# Patient Record
Sex: Female | Born: 1970
Health system: Southern US, Community
[De-identification: ages and names within clinical notes are randomized; demographics above are authoritative.]

## PROBLEM LIST (undated history)

## (undated) DIAGNOSIS — M1990 Unspecified osteoarthritis, unspecified site: Secondary | ICD-10-CM

## (undated) DIAGNOSIS — I10 Essential (primary) hypertension: Secondary | ICD-10-CM

## (undated) DIAGNOSIS — B019 Varicella without complication: Secondary | ICD-10-CM

## (undated) DIAGNOSIS — O039 Complete or unspecified spontaneous abortion without complication: Secondary | ICD-10-CM

## (undated) DIAGNOSIS — T7840XA Allergy, unspecified, initial encounter: Secondary | ICD-10-CM

## (undated) DIAGNOSIS — M549 Dorsalgia, unspecified: Secondary | ICD-10-CM

## (undated) DIAGNOSIS — E559 Vitamin D deficiency, unspecified: Secondary | ICD-10-CM

## (undated) DIAGNOSIS — M2550 Pain in unspecified joint: Secondary | ICD-10-CM

## (undated) DIAGNOSIS — R002 Palpitations: Secondary | ICD-10-CM

## (undated) DIAGNOSIS — K219 Gastro-esophageal reflux disease without esophagitis: Secondary | ICD-10-CM

## (undated) DIAGNOSIS — R12 Heartburn: Secondary | ICD-10-CM

## (undated) HISTORY — DX: Unspecified osteoarthritis, unspecified site: M19.90

## (undated) HISTORY — DX: Varicella without complication: B01.9

## (undated) HISTORY — DX: Heartburn: R12

## (undated) HISTORY — DX: Palpitations: R00.2

## (undated) HISTORY — PX: OTHER SURGICAL HISTORY: SHX169

## (undated) HISTORY — DX: Vitamin D deficiency, unspecified: E55.9

## (undated) HISTORY — PX: TUBAL LIGATION: SHX77

## (undated) HISTORY — DX: Pain in unspecified joint: M25.50

## (undated) HISTORY — DX: Allergy, unspecified, initial encounter: T78.40XA

## (undated) HISTORY — DX: Complete or unspecified spontaneous abortion without complication: O03.9

## (undated) HISTORY — DX: Essential (primary) hypertension: I10

## (undated) HISTORY — DX: Gastro-esophageal reflux disease without esophagitis: K21.9

## (undated) HISTORY — DX: Dorsalgia, unspecified: M54.9

---

## 1998-04-22 ENCOUNTER — Inpatient Hospital Stay (HOSPITAL_COMMUNITY): Admission: AD | Admit: 1998-04-22 | Discharge: 1998-04-22 | Payer: Self-pay | Admitting: Obstetrics and Gynecology

## 1998-05-19 ENCOUNTER — Inpatient Hospital Stay (HOSPITAL_COMMUNITY): Admission: AD | Admit: 1998-05-19 | Discharge: 1998-05-19 | Payer: Self-pay | Admitting: *Deleted

## 1998-05-21 ENCOUNTER — Inpatient Hospital Stay (HOSPITAL_COMMUNITY): Admission: AD | Admit: 1998-05-21 | Discharge: 1998-05-21 | Payer: Self-pay | Admitting: *Deleted

## 1998-05-25 ENCOUNTER — Inpatient Hospital Stay (HOSPITAL_COMMUNITY): Admission: AD | Admit: 1998-05-25 | Discharge: 1998-05-27 | Payer: Self-pay | Admitting: *Deleted

## 1998-06-15 ENCOUNTER — Inpatient Hospital Stay (HOSPITAL_COMMUNITY): Admission: AD | Admit: 1998-06-15 | Discharge: 1998-06-19 | Payer: Self-pay | Admitting: Obstetrics and Gynecology

## 1998-07-29 ENCOUNTER — Other Ambulatory Visit: Admission: RE | Admit: 1998-07-29 | Discharge: 1998-07-29 | Payer: Self-pay | Admitting: *Deleted

## 1999-07-26 ENCOUNTER — Other Ambulatory Visit: Admission: RE | Admit: 1999-07-26 | Discharge: 1999-07-26 | Payer: Self-pay | Admitting: *Deleted

## 2000-09-20 ENCOUNTER — Other Ambulatory Visit: Admission: RE | Admit: 2000-09-20 | Discharge: 2000-09-20 | Payer: Self-pay | Admitting: *Deleted

## 2001-06-24 ENCOUNTER — Emergency Department (HOSPITAL_COMMUNITY): Admission: EM | Admit: 2001-06-24 | Discharge: 2001-06-24 | Payer: Self-pay | Admitting: Emergency Medicine

## 2001-06-24 ENCOUNTER — Encounter: Payer: Self-pay | Admitting: Emergency Medicine

## 2001-06-26 ENCOUNTER — Other Ambulatory Visit: Admission: RE | Admit: 2001-06-26 | Discharge: 2001-06-26 | Payer: Self-pay | Admitting: *Deleted

## 2002-09-12 ENCOUNTER — Other Ambulatory Visit: Admission: RE | Admit: 2002-09-12 | Discharge: 2002-09-12 | Payer: Self-pay | Admitting: *Deleted

## 2003-10-31 ENCOUNTER — Other Ambulatory Visit: Admission: RE | Admit: 2003-10-31 | Discharge: 2003-10-31 | Payer: Self-pay | Admitting: Obstetrics and Gynecology

## 2005-01-18 ENCOUNTER — Other Ambulatory Visit: Admission: RE | Admit: 2005-01-18 | Discharge: 2005-01-18 | Payer: Self-pay | Admitting: Obstetrics and Gynecology

## 2006-01-27 ENCOUNTER — Other Ambulatory Visit: Admission: RE | Admit: 2006-01-27 | Discharge: 2006-01-27 | Payer: Self-pay | Admitting: Obstetrics and Gynecology

## 2006-03-29 ENCOUNTER — Encounter: Payer: Self-pay | Admitting: Emergency Medicine

## 2007-01-31 ENCOUNTER — Ambulatory Visit: Payer: Self-pay | Admitting: Cardiovascular Disease

## 2007-02-02 ENCOUNTER — Encounter (INDEPENDENT_AMBULATORY_CARE_PROVIDER_SITE_OTHER): Payer: Self-pay | Admitting: Specialist

## 2007-02-02 ENCOUNTER — Ambulatory Visit (HOSPITAL_COMMUNITY): Admission: RE | Admit: 2007-02-02 | Discharge: 2007-02-02 | Payer: Self-pay | Admitting: Obstetrics and Gynecology

## 2007-02-07 ENCOUNTER — Ambulatory Visit (HOSPITAL_COMMUNITY): Admission: AD | Admit: 2007-02-07 | Discharge: 2007-02-07 | Payer: Self-pay | Admitting: Obstetrics and Gynecology

## 2007-02-09 ENCOUNTER — Ambulatory Visit: Payer: Self-pay | Admitting: Cardiovascular Disease

## 2007-03-29 ENCOUNTER — Ambulatory Visit: Payer: Self-pay | Admitting: Cardiovascular Disease

## 2008-09-12 ENCOUNTER — Inpatient Hospital Stay (HOSPITAL_COMMUNITY): Admission: AD | Admit: 2008-09-12 | Discharge: 2008-09-12 | Payer: Self-pay | Admitting: Obstetrics and Gynecology

## 2008-10-03 ENCOUNTER — Inpatient Hospital Stay (HOSPITAL_COMMUNITY): Admission: AD | Admit: 2008-10-03 | Discharge: 2008-10-03 | Payer: Self-pay | Admitting: Obstetrics and Gynecology

## 2008-10-27 ENCOUNTER — Inpatient Hospital Stay (HOSPITAL_COMMUNITY): Admission: AD | Admit: 2008-10-27 | Discharge: 2008-10-27 | Payer: Self-pay | Admitting: Obstetrics and Gynecology

## 2008-11-03 ENCOUNTER — Encounter (INDEPENDENT_AMBULATORY_CARE_PROVIDER_SITE_OTHER): Payer: Self-pay | Admitting: Obstetrics and Gynecology

## 2008-11-03 ENCOUNTER — Inpatient Hospital Stay (HOSPITAL_COMMUNITY): Admission: RE | Admit: 2008-11-03 | Discharge: 2008-11-06 | Payer: Self-pay | Admitting: Obstetrics and Gynecology

## 2011-01-13 ENCOUNTER — Other Ambulatory Visit: Payer: Self-pay | Admitting: Obstetrics and Gynecology

## 2011-01-13 DIAGNOSIS — R928 Other abnormal and inconclusive findings on diagnostic imaging of breast: Secondary | ICD-10-CM

## 2011-01-27 ENCOUNTER — Other Ambulatory Visit: Payer: Self-pay | Admitting: Obstetrics and Gynecology

## 2011-01-27 ENCOUNTER — Ambulatory Visit
Admission: RE | Admit: 2011-01-27 | Discharge: 2011-01-27 | Disposition: A | Discharge status: Home / Self Care | Payer: Self-pay | Source: Ambulatory Visit | Attending: Obstetrics and Gynecology | Admitting: Obstetrics and Gynecology

## 2011-01-27 DIAGNOSIS — R928 Other abnormal and inconclusive findings on diagnostic imaging of breast: Secondary | ICD-10-CM

## 2011-01-27 DIAGNOSIS — Z09 Encounter for follow-up examination after completed treatment for conditions other than malignant neoplasm: Secondary | ICD-10-CM

## 2011-04-12 NOTE — H&P (Signed)
NAME:  Gina, Farrell NO.:  [id number]   MEDICAL RECORD NO.:  [id number]         PATIENT TYPE:  WINP   LOCATION:                                FACILITY:  [facility]   PHYSICIAN:  Juluis Mire, M.D.   DATE OF BIRTH:  1971/08/13   DATE OF ADMISSION:  11/03/2008  DATE OF DISCHARGE:                              HISTORY & PHYSICAL   The patient is a 40 year old, gravida 5, para 1-1-2-2, female, last  menstrual period of February 14, 2008, with an estimated date of  confinement of November 20, 2008, estimated gestational age of 36-1/2  weeks.   The patient had a prior cesarean sections and is now admitted for repeat  cesarean section and bilateral tubal ligation.  Her prenatal course has  been complicated by chronic hypertension.  She has been on labetalol and  hydrochlorothiazide.  Blood pressure has been rising over the last few  weeks.  On Friday, we did an amniocentesis for lung maturity, LS was  2.81 with PG.  The patient is desirous to proceed with repeat cesarean  section at the present time.  Trial of labor was discussed.  She also  desires for permanent sterilization.   The patient was also at risk for advanced maternal age.  She did undergo  first trimester screening.  This was basically unremarkable.  She did  not undergo amniocentesis, although it was offered.   In terms of allergies, no known drug allergies.   Medications include:  1. Labetalol 100 mg twice a day.  2. Hydrochlorothiazide 25 mg daily.  3. Prenatal vitamins.   For past medical history, family history, and social history, please see  prenatal records.   REVIEW OF SYSTEMS:  Noncontributory.   PHYSICAL EXAMINATION:  GENERAL:  The patient is afebrile.  VITAL SIGNS:  Stable.  HEENT:  The patient is normocephalic.  Pupils equal, round, and reactive  to light and accommodation.  Extraocular movements were intact.  Sclerae  and conjunctivae clear.  Oropharynx clear.  NECK:  Without  thyromegaly.  BREASTS:  Not examined.  LUNGS:  Clear.  CARDIAC:  Regular rate without murmurs or gallops.  ABDOMEN:  Gravid uterus consistent with dates.  PELVIC:  Cervix is long and closed.  EXTREMITIES:  Trace edema.  NEUROLOGIC:  Grossly within normal limits.   IMPRESSION:  1. Intrauterine pregnancy at 37-1/2 weeks.  2. Chronic hypertension.  3. Prior cesarean section for repeat.  4. Desires sterility.   PLAN:  The patient will undergo repeat cesarean section with bilateral  tubal ligation.  The potential irreversibility of sterilization was  discussed.  Failure rate of 1 in 200 was quoted.  It can be in the form  of ectopic pregnancy requiring further surgical management.  Alternatives were discussed.  The risk of C-section was explained  including the risk of infection.  The risk of hemorrhage that could  require transfusion versus the risk of AIDS or hepatitis.  Risk of  injury to adjacent organs including bladder, bowel, ureters that could  require further exploratory surgery.  Risk of deep venous thrombosis  and  pulmonary embolus.  The patient expressed understand of indications and  risks.      Juluis Mire, M.D.  Electronically Signed     JSM/MEDQ  D:  11/03/2008  T:  11/04/2008  Job:  [id number]

## 2011-04-12 NOTE — Op Note (Signed)
Gina Farrell, Gina Farrell NO.:  [id number]   MEDICAL RECORD NO.:  [id number]          PATIENT TYPE:  INP   LOCATION:  9126                          FACILITY:  [facility]   PHYSICIAN:  Juluis Mire, M.D.   DATE OF BIRTH:  [birth year]   DATE OF PROCEDURE:  DATE OF DISCHARGE:                               OPERATIVE REPORT   PREOPERATIVE DIAGNOSES:  Pregnancy at 37+ weeks.  Chronic hypertension.  Repeat cesarean section.  Desires sterility.   POSTOPERATIVE DIAGNOSES:  Pregnancy at 37+ weeks.  Chronic hypertension.  Repeat cesarean section.  Desires sterility.   PROCEDURE:  Low transverse cesarean section, bilateral tubal ligation.   SURGEON:  Juluis Mire, MD   ANESTHESIA:  Spinal.   ESTIMATED BLOOD LOSS:  500-600 mL.   PACKS AND DRAINS:  None.   INTRAOPERATIVE BLOOD PLACED:  None.   COMPLICATIONS:  None.   INDICATIONS:  As dictated in history and physical procedure.   PROCEDURE:  The patient was taken to the OR, placed in supine position  with left lateral tilt.  After satisfactory level of spinal anesthesia  was obtained, the abdomen was prepped out with Betadine and draped  sterile field.  Prior low transverse cesarean section scar was excised.  The incision was extended through subcutaneous tissue.  The anterior  rectus fascia identified and sharp incision fashioned laterally.  Fascia  taken off the muscle superiorly and inferiorly.  Rectus muscles were  separated in the midline.  Peritoneum was entered sharp.  An incision  apparently extended both superiorly and inferiorly.  Low transverse  bladder flap was developed.  A low transverse uterine incision begun  with knife, extended laterally to manage traction.  The infant was in  the vertex presentation, delivered with elevation and fundal pressure,  there was a viable female weighing 7 pounds 3 ounces, Apgars were 9/9.  Umbilical artery pH was 7.31.  Placenta was delivered manually.  The  uterus was  exteriorized for closure.  Uterus was then closed with  running locking suture of 0 chromic using a two-layer closure technique.  We had good hemostasis.  Tubes and ovaries were unremarkable.  The  patient desires permanent sterilization.  Mid segment of each tube was  elevated with a Babcock.  A hole was made in the avascular of the  mesosalpinx using the Bovie.  Individual sutures of 0 plain catgut were  used to ligate off the segment of tube.  The intervening segment tube  was excised.  Cut ends of the tubes were cauterized using the Bovie.  Tubes went to pathology.  Uterus then returned to the abdominal cavity.  We irrigated the pelvis, had good hemostasis, and clear urine output.  Peritoneum muscles closed with running suture of 3-0 Vicryl.  Fascia  closed with running suture of 0 PDS.  Skin was closed with staples  and Steri-Strips.  Sponge, instrument, needle count was reported correct  by circulating nurse x2.  Foley catheter remained clear at time of  closure.  The patient tolerated the procedure well and was returned to  recovery room  in good condition.      Juluis Mire, M.D.  Electronically Signed     JSM/MEDQ  D:  11/03/2008  T:  11/04/2008  Job:  [id number]

## 2011-04-15 NOTE — Assessment & Plan Note (Signed)
[facility]                            CARDIOLOGY OFFICE NOTE   HERLINDA, HEADY                        MRN:          [id number]  DATE:03/29/2007                            DOB:          [birth year]    Kerby Less returns for outpatient followup at the [facility] Cardiology  Office on Mar 29, 2007.  She is a very nice 40 year old woman who returns  for further evaluation of her hypertension.  Mrs. Szuch was initially  seen back in early March of this year when she was at 50 weeks'  gestation in her third pregnancy.  Unfortunately, she miscarried during  that pregnancy.  She returns now for followup.  She had previously been  on Benicar hydrochlorothiazide and was well-controlled.  When she  presented to see me, she was on labetalol and I had increased her  labetalol to twice daily dosing in order to improve her blood pressure  control.  She returns today on b.i.d. labetalol and brings in some home  blood pressure readings.  She has had several readings just over 140  systolic and over 90 diastolic.  She has really not had too many  readings at goal of less than 135/85.  She has not had any symptoms.  She has not had any chest pain, dyspnea, orthopnea, or PND.  She has a  few palpitations, but these are much less than when she was pregnant.   CURRENT MEDICATIONS:  1. Multivitamin.  2. Labetalol 100 mg twice daily.   EXAM:  She is alert and oriented in no acute distress.  Weight 242 pounds, blood pressure 130/100, heart rate 73, respiratory  rate 16.  HEENT:  Normal.  NECK:  Normal carotid upstrokes without bruits.  Jugular venous pressure  is normal.  LUNGS:  Clear to auscultation bilaterally.  HEART:  Regular rate and rhythm without murmurs or gallops.  ABDOMEN:  Soft and nontender.  EXTREMITIES:  No cyanosis, clubbing, or edema.  Peripheral pulses are 2+  and equal throughout.   ASSESSMENT:  Ms. Bodnar is a 40 year old woman with essential  hypertension.  She has suboptimal control of her blood pressure on twice  daily labetalol.  I reviewed her situation in detail today and I think  that the addition of hydrochlorothiazide would be a reasonable first  choice.  We could consider increasing her labetalol dose, but she has  had some bradycardia in the past and I do not think she will be tolerant  to a much higher dose of labetalol.  Hydrochlorothiazide would be a  reasonable choice if she does become pregnant again, which she is  currently considering.  However, if there is a significant concern for  oligohydramnios then we could use an alternative medication.  For the  time being, I think we should try her on this combination and she will  let us know if she decides to try for another pregnancy.  At that point,  I will be in contact with Dr. Arelia Sneddon and discuss the issue in detail  with him.   I will now plan on seeing  Ms. Seim back on a p.r.n. basis.  She will  contact our office if she has a suboptimal response to her medication,  or if she develops any problems.  She was also  advised to contact us if she becomes pregnant again.  She keeps a close  record of her home blood pressures and I did not think it was necessary  to schedule her for a return nurse visit.     Veverly Fells. Excell Seltzer, MD  Electronically Signed    MDC/MedQ  DD: 03/29/2007  DT: 03/29/2007  Job #: [id number]   cc:   Juluis Mire, M.D.  Lucky Cowboy, M.D.

## 2011-04-15 NOTE — H&P (Signed)
NAME:  EXA, BOMBA NO.:  [id number]   MEDICAL RECORD NO.:  [id number]          PATIENT TYPE:  AMB   LOCATION:  SDC                           FACILITY:  [facility]   PHYSICIAN:  Juluis Mire, M.D.   DATE OF BIRTH:  Jan 19, 1971   DATE OF ADMISSION:  DATE OF DISCHARGE:                              HISTORY & PHYSICAL   The patient is a 40 year old gravida 4, para 1-1-1-2 female who presents  for D&E.   The patient was seen in our office at 12 weeks.  An ultrasound was  performed which revealed no fetal heart activity consistent with a  nonviable pregnancy.  The patient now presents for dilatation and  evacuation.  Blood type is O positive.   In terms of allergies, no known drug allergies.   MEDICATIONS:  Include prenatal vitamins and labetalol.   PAST MEDICAL HISTORY:  Significant for a history of hypertension under  active management.  In terms of surgical history,  she has had a D&E in  1997.  She had a C-section in 1993.  Laser conization in 1994.  She has  had vaginal deliveries in 1999.   FAMILY HISTORY:  Noncontributory.   SOCIAL HISTORY:  No tobacco or alcohol use.   REVIEW OF SYSTEMS:  Noncontributory.   PHYSICAL EXAMINATION:  Afebrile with stable vital signs.  HEENT:  The patient is normocephalic.  Pupils equal, round and reactive  to light and accommodation.  Extraocular were intact.  Sclerae and  conjunctivae are clear.  Oropharynx clear.  NECK:  Without thyromegaly.  BREASTS:  Not examined.  LUNGS:  Clear.  CARDIOVASCULAR SYSTEM:  Regular rhythm and rate without murmurs or  gallops.  ABDOMINAL EXAM:  Is benign.  PELVIC:  Normal external genitalia.  Vaginal mucosa is clear.  Cervix  unremarkable.  Uterus approximately 10 weeks in size.  Adnexa  unremarkable.  EXTREMITIES:  Trace edema.  NEUROLOGIC EXAM:  Grossly within normal limits.   IMPRESSION:  Nonviable first trimester pregnancy.   PLAN:  The patient will undergo dilatation and  evacuation.  The risks  have been discussed including the risk of infection.  The risk of  hemorrhage could require transfusion with the risk of AIDS or hepatitis.  Excessive bleeding  could lead to hysterectomy.  There is a risk of perforation that could  lead to injury to adjacent organs such as bowel requiring further  exploratory surgery.  Risk of deep venous thrombosis and pulmonary  embolus.  The patient expressed understanding of the indications and  risks.      Juluis Mire, M.D.  Electronically Signed     JSM/MEDQ  D:  02/02/2007  T:  02/02/2007  Job:  [id number]

## 2011-04-15 NOTE — Op Note (Signed)
NAME:  Gina Farrell, MCCLEES NO.:  [id number]   MEDICAL RECORD NO.:  [id number]          PATIENT TYPE:  AMB   LOCATION:  [facility]                          FACILITY:  [facility]   PHYSICIAN:  Juluis Mire, M.D.   DATE OF BIRTH:  12/11/1970   DATE OF PROCEDURE:  02/07/2007  DATE OF DISCHARGE:                               OPERATIVE REPORT   PREOPERATIVE DIAGNOSIS:  Retained products of pregnancy or intrauterine  clot.   POSTOPERATIVE DIAGNOSIS:  Retained products of pregnancy or intrauterine  clot.  O'clock.   OPERATIVE PROCEDURE:  Intrauterine evacuation.   SURGEON:  Juluis Mire, M.D.   ANESTHESIA:  Sedation.   ESTIMATED BLOOD LOSS:  Minimal.   PACKS AND DRAINS:  None.   INTRAOPERATIVE BLOOD PLACED:  none.   COMPLICATIONS:  None.   INDICATIONS:  Dictated in the history and physical.   PROCEDURE:  The patient was taken to the OR, placed in supine position.  After sedation, was placed in dorsal lithotomy position.  Patient was  draped in a sterile field and a speculum was placed in the vaginal  vault.  A moderate amount of clots were removed.  Cervix and vagina  cleansed with Betadine.  Cervix was grasped with a single-tooth  tenaculum.  The cervix was already dilated.  A size 8 curved suction  curette was introduced and the intrauterine cavity was suction-curetted.  We retrieved a moderate amount of old clots, possible products of  conception.  Eventually no more clotted material was noted.  We began  Pitocin and gave her IV Ancef. Subsequently intrauterine curettings  revealed all quadrants to be clear.  Repeat suction curetting revealed  no additional tissue.  The uterus was contracting down well and there  was minimal bleeding.   At this point the patient was taken out of the dorsal lithotomy position  and once alert transferred to the recovery room in good condition.  Sponge, instrument and needle count reported as correct by circulating  nurse x2.     Juluis Mire, M.D.  Electronically Signed    JSM/MEDQ  D:  02/07/2007  T:  02/08/2007  Job:  [id number]

## 2011-04-15 NOTE — H&P (Signed)
NAME:  Gina Farrell, Gina Farrell NO.:  [id number]   MEDICAL RECORD NO.:  [id number]          PATIENT TYPE:  AMB   LOCATION:  [facility]                          [facility]   PHYSICIAN:  Juluis Mire, M.D.   DATE OF BIRTH:  11-08-1971   DATE OF ADMISSION:  02/07/2007  DATE OF DISCHARGE:                              HISTORY & PHYSICAL   A 40 year old gravida 4, para 1-1-2-2 female who presents for repeat  D&E.   The patient did have a nonviable pregnancy picked up last week.  She  underwent a D &E in the hospital on March 12.  She presented back today  complaining of pain and discomfort and heavy bleeding.  On an ultrasound  evaluation, has a large amount of clot and possible products of  conception within the uterine cavity.  We are going to repeat the D&C.  Her blood type is O positive.   In terms of allergies, no known drug allergies.   MEDICATIONS:  Include vitamins and labetalol.   PAST MEDICAL HISTORY:  History of hypertension under active management.   SURGICAL HISTORY:  She had a D&E in 1997.  She has C-section in 1993, a  laser conization in 1994, and she has had vaginal delivery in 1999.   FAMILY HISTORY:  Noncontributory.   SOCIAL HISTORY:  No tobacco or alcohol use.   REVIEW OF SYSTEMS:  Is noncontributory.   PHYSICAL EXAMINATION:  The patient is afebrile with stable vital signs.  HEENT:  The patient is normocephalic.  Pupils equal, round, and reactive  to light and accommodation.  Extraocular movements were intact.  Sclerae  and conjunctivae were clear.  Oropharynx clear.  NECK:  Without thyromegaly.  BREASTS:  No discrete masses.  LUNGS:  Clear.  CARDIOVASCULAR SYSTEM:  Regular rhythm and rate without murmurs or  gallops.  Her abdominal exam is benign.  She does have some tenderness in the  suprapubic area and a fullness on pelvic exam, moderate bleeding.  Uterus is approximately 16 weeks in size.  Adnexa unremarkable.  EXTREMITIES:  Trace edema.  NEUROLOGICAL EXAM:  Grossly within normal limits.   Ultrasound did reveal a large amount of clotted material within the  uterine cavity.   IMPRESSION:  Clots within the uterine cavity versus retained products.   PLAN:  The patient will undergo a repeat dilatation and evacuation.  Risk were discussed including the risk of infection.  Risk of hemorrhage  could require transfusion with the risk of AIDS or hepatitis.  Could be  the rare risk of  hysterectomy, risk of injury to adjacent organs through perforation  including bowel/bladder that could require further exploratory surgery.  Risk of deep venous thrombosis and pulmonary embolus.  The patient  expressed understanding of indications and risks.      Juluis Mire, M.D.  Electronically Signed     JSM/MEDQ  D:  02/07/2007  T:  02/08/2007  Job:  [id number]

## 2011-04-15 NOTE — Discharge Summary (Signed)
[name], Farrell NO.:  [id number]   MEDICAL RECORD NO.:  [id number]          PATIENT TYPE:  INP   LOCATION:  9126                          FACILITY:  [facility]   PHYSICIAN:  Dineen Kid. Rana Snare, M.D.    DATE OF BIRTH:  10/09/1971   DATE OF ADMISSION:  11/03/2008  DATE OF DISCHARGE:  11/06/2008                               DISCHARGE SUMMARY   ADMITTING DIAGNOSES:  1. Intrauterine pregnancy at 37+ weeks estimated gestational age.  2. Chronic hypertension.  3. Previous cesarean section, desires repeat.  4. Multiparity, desires permanent sterilization.   DISCHARGE DIAGNOSES:  1. Status post low transverse cesarean section.  2. Viable female infant.  3. Permanent sterilization.   REASON FOR ADMISSION:  Please see dictated H&P.   HOSPITAL COURSE:  The patient is a 40 year old gravida 5, para 1-1-2-2  that was admitted to [facility] for scheduled cesarean  section.  The patient's pregnancy had been complicated by chronic  hypertension.  The patient had had previous cesarean delivery and  desired repeat.  Due to multiparity, the patient also requested  permanent sterilization.  On the morning of admission, the patient was  taken to the operating room where spinal anesthesia was administered  without difficulty.  A low transverse incision was made with delivery of  a viable female infant weighing 7 pounds 3 ounces with Apgars of 9 at 1  minute and 9 at 5 minutes.  Arterial cord pH of 7.31.  The patient  underwent bilateral tubal ligation without difficulty.  She tolerated  the procedure well and was taken to the recovery room in stable  condition.   On postoperative day #1, the patient was without complaint.  Vital signs  remained stable.  She was afebrile.  Blood pressure was 110/71 to  130/84.  Abdomen soft with good return of bowel function.  Fundus was  firm and nontender.  Foley had been discontinued.  The patient was  voiding well.  Abdominal  dressing noted to have a small amount of  drainage on the bandage on the left side.  Laboratory findings showed  hemoglobin of 10.7, platelet count of 189,000, WBC count of 11.4.  Blood  type was noted to be O+.  On postoperative day #2, the patient was  without complaint.  She had slept better.  Baby was feeding well.  Vital  signs were stable.  She was afebrile.  Fundus was firm and nontender.  Incision was clean, dry, and intact with slight moisture noted under the  pannus at the incisional site.  No bleeding or erythema was noted.  On  postoperative day #3, the patient was without complaint.  She denied  headache or blurred vision or right upper quadrant pain.  Vital signs  were stable.  She is afebrile.  Blood pressure was 148/99 to 139/90.  Deep tendon reflexes were 1-2+.  No clonus.  Small amount of pedal edema  was noted.  Abdomen soft.  Fundus firm and nontender.  Incision was  clean and intact.  Slight moisture was noted and staples were left in  place.  Discharge instructions reviewed and the patient was later  discharged home.   CONDITION ON DISCHARGE:  Stable.   DIET:  Regular as tolerated.   ACTIVITY:  No heavy lifting.  No driving x2 weeks.  No vaginal entry.   FOLLOWUP:  The patient is to follow up in the office in 4 days for blood  pressure check and an incision check.  She is to call for temperature  greater than 100 degrees, persistent nausea, vomiting, heavy vaginal  bleeding and/or redness or drainage from the incisional site.   DISCHARGE MEDICATIONS:  1. Tylox #30 one p.o. 4-6 hours p.r.n.  2. Motrin 600 mg every 6 hours.  3. Labetalol 200 mg 1 p.o. b.i.d.  4. Hydrochlorothiazide 25 mg 1 p.o. daily.  5. Prenatal vitamins 1 p.o. daily.  6. Colace 1 p.o. daily p.r.n.      Gina Farrell, N.P.      Dineen Kid Rana Snare, M.D.  Electronically Signed    CC/MEDQ  D:  12/16/2008  T:  12/16/2008  Job:  [id number]

## 2011-04-15 NOTE — Letter (Signed)
January 31, 2007    Juluis Mire, M.D.  [address] [place], [place] [address]   RE:  Gina Farrell, Gina Farrell  MRN:  [id number]  /  DOB:  1971/06/08   Dear Dr. Arelia Sneddon:   It was my pleasure to see Crystel Demarco in the [facility]  as an outpatient on January 31, 2007.  As you know, she is a 40 year old  woman who is at [redacted] weeks gestation into her third pregnancy, who has  essential hypertension and frequent ventricular ectopy and palpitations.   Ms. Gutkowski was first diagnosed with pregnancy-induced hypertension when  she had her second child several years ago.  She has had persistent  essential hypertension in the interim and has been treated successfully  with Benicar hydrochlorothiazide.  When she started trying to have her  third child back in November of 2007, she was switched from Benicar  hydrochlorothiazide to labetalol.  She has more recently noted  increasing palpitations and has been noted to have frequent PVCs during  her office visits.  It is unclear whether this increase in symptoms is  associated with the change to labetalol, or whether it is a function of  her pregnancy.  In addition, she has not had as good a blood pressure  control since she has made this change.  She reports home blood pressure  readings between 125-140 systolic over 85-90 diastolic.  When she has  been seen in the physician's office, her blood pressures have been  higher than that, typically in the 140s over 90s.   From a symptomatic standpoint she is doing very well.  She has some  expected fatigue, but otherwise feels fine.  She has no chest pain,  dyspnea, orthopnea, PND, light-headedness, syncope.  She has mild edema  of her legs.   CURRENT MEDICATIONS:  1. Labetalol 100 mg daily.  2. Multivitamin daily.   ALLERGIES:  NKDA.   PAST MEDICAL HISTORY:  1. Essential hypertension.  2. History of C-section with her first child.  She reports no other      medical problems or  hospitalizations.   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  She is married with 2 children and is currently pregnant with her  third child.  She does not smoke cigarettes or drink alcohol.   FAMILY HISTORY:  The patient's maternal grandmother had coronary artery  disease first diagnosed in her 58s.  There is no other coronary artery  disease in her family.  Her parents are healthy.   REVIEW OF SYSTEMS:  A complete 12-point review of systems was performed.  There were no pertinent positives to report other than as mentioned  above.   PHYSICAL EXAMINATION:  The patient is alert and oriented.  She is in no  acute distress.  Weight is 244 pounds.  Blood pressure is 145/98.  On my recheck, her  blood pressure was 150/94.  Heart rate is 74, respiratory rate is 12.  HEENT:  Normal.  NECK:  Normal carotid upstrokes without bruits.  Jugular venous pressure  is normal.  There is no thyromegaly or thyroid nodules.  LUNGS:  Clear to auscultation bilaterally.  CARDIOVASCULAR:  The apex is discrete and nondisplaced.  There is no  right ventricular heave or lift.  The heart is regular rate and rhythm  with a soft grade 1/6 systolic ejection murmur at the left upper sternal  border.  There are no gallops  or diastolic murmurs.  ABDOMEN:  Soft.  Obese.  Nontender.  No organomegaly.  No abdominal  bruits.  EXTREMITIES:  There is trace edema in the pretibial region bilaterally.  There is no clubbing or cyanosis.  Peripheral pulses are 2+ and equal  throughout.  SKIN:  Warm and dry without rash.  LYMPHATICS:  There is no adenopathy.  NEUROLOGIC:  Cranial nerves 2-12 are intact.  Strength is 5/5 and equal  in the arms and legs bilaterally.   ELECTROCARDIOGRAM:  Demonstrates normal sinus rhythm with a ventricular  rate of 78.  There are no abnormalities seen.   ASSESSMENT:  Ms. Sobczak is a 40 year old woman who is at 11-weeks  gestation with her third pregnancy.   Her cardiac issues are as follows.  1. Essential hypertension with suboptimal control.  It is difficult to      know whether labetalol is causing her more trouble with symptomatic      palpitations.  Typically, this would not be the case with beta      blockers, but there seems to be a temporal correlation.  Despite      that, I think she needs better blood pressure control overall and I      have asked her to increase her labetalol to 100 mg twice daily.  If      she has worsening symptoms with that, then we can change her      antihypertensive regimen all together.  I asked her to record her      blood pressures at home over the next 4 weeks and I would like to      see her back after that time to review her blood pressure control      and consider whether we need to make any changes.  2. Palpitations.  I would like to check a 24h Holter monitor to make      sure that she is not having any sustained arrhythmias or other      significant problems.  I suspect her palpitations are from benign      single premature ventricular contractions.  I will wait 1 week to      check her Holter monitor so that she will have that time on b.i.d.      labetalol.  3. Followup.  For followup I will plan on seeing her back in 1 month      after her Holter monitor is complete and we will review her blood      pressure control at that time.   Dr. Arelia Sneddon, thanks for allowing me to participate in the care of Ms.  Scorsone.  Please feel free to call at any time with questions regarding her  care.    Sincerely,      Veverly Fells. Excell Seltzer, MD  Electronically Signed    MDC/MedQ  DD: 01/31/2007  DT: 01/31/2007  Job #: [id number]

## 2011-04-15 NOTE — Op Note (Signed)
[name], Gina Farrell NO.:  [id number]   MEDICAL RECORD NO.:  [id number]          PATIENT TYPE:  AMB   LOCATION:  SDC                           FACILITY:  [facility]   PHYSICIAN:  Juluis Mire, M.D.   DATE OF BIRTH:  08-Oct-1971   DATE OF PROCEDURE:  02/02/2007  DATE OF DISCHARGE:                               OPERATIVE REPORT   PREOPERATIVE DIAGNOSIS:  Nonviable first trimester pregnancy.   POSTOPERATIVE DIAGNOSIS:  Nonviable first trimester pregnancy.   OPERATIVE PROCEDURE:  Paracervical block with dilatation and evacuation.   SURGEON:  Dr. Arelia Sneddon.   ANESTHESIA:  Paracervical block and sedation.   ESTIMATED BLOOD LOSS:  Minimal.   PACKS AND DRAINS:  None.   INTRAOPERATIVE BLOOD REPLACED:  None.   COMPLICATIONS:  None.   INDICATIONS:  As dictated in the history and physical.   PROCEDURE AS FOLLOWS:  The patient was taken to the OR and placed in  supine position. After sedation, the patient was placed in the dorsal  lithotomy position using the Allen stirrups. The patient had been draped  in a sterile field. A speculum was placed in the vaginal vault. The  cervix and vagina were cleansed thoroughly with Betadine. A paracervical  block was instituted using 1% Nesacaine. Cervix was grasped with a  single-toothed tenaculum. Uterus sounded to 12 cm. Cervix was serially  dilated to a size 31 Pratt dilator. A size 10 curved suction curet was  introduced, and the intrauterine was emptied using suction curetting. We  continued this until no additional tissue obtained. We then sharply  curetted and felt that all quadrants were clear. Repeat suction  curetting revealed no additional tissue, and the uterus contracted down  well. There was no active bleeding or signs or perforation. At this  point in time, the speculum and single-toothed tenaculum were removed.  The patient was taken out of the dorsal lithotomy position and once  alert transferred to the recovery room  in good condition. Sponge,  needle, and instrument counts reported as correct by the circulating  nurse.      Juluis Mire, M.D.  Electronically Signed    JSM/MEDQ  D:  02/02/2007  T:  02/02/2007  Job:  [id number]

## 2011-08-02 ENCOUNTER — Other Ambulatory Visit: Payer: Self-pay

## 2011-08-16 ENCOUNTER — Other Ambulatory Visit: Payer: Self-pay

## 2011-08-16 ENCOUNTER — Inpatient Hospital Stay: Admission: RE | Admit: 2011-08-16 | Payer: Self-pay | Source: Ambulatory Visit

## 2011-08-29 LAB — COMPREHENSIVE METABOLIC PANEL
AST: 18
CO2: 24
Calcium: 9.3
Chloride: 104
Creatinine, Ser: 0.49
GFR calc non Af Amer: >60
Glucose, Bld: 87
Total Bilirubin: 0.1 — ABNORMAL LOW

## 2011-08-29 LAB — CBC
HCT: 36.1
Hemoglobin: 12.1
MCHC: 33.6
MCV: 88.1
RBC: 4.1
WBC: 10.5

## 2011-08-29 LAB — LACTATE DEHYDROGENASE: LDH: 100

## 2011-08-30 ENCOUNTER — Ambulatory Visit
Admission: RE | Admit: 2011-08-30 | Discharge: 2011-08-30 | Disposition: A | Discharge status: Home / Self Care | Payer: 59 | Source: Ambulatory Visit | Attending: Obstetrics and Gynecology | Admitting: Obstetrics and Gynecology

## 2011-08-30 DIAGNOSIS — Z09 Encounter for follow-up examination after completed treatment for conditions other than malignant neoplasm: Secondary | ICD-10-CM

## 2011-08-30 LAB — CBC
HCT: 32.5 — ABNORMAL LOW
MCHC: 34.7
MCV: 86
RBC: 3.78 — ABNORMAL LOW
WBC: 8.9

## 2011-08-30 LAB — LACTATE DEHYDROGENASE: LDH: 116

## 2011-08-30 LAB — COMPREHENSIVE METABOLIC PANEL
BUN: 5 — ABNORMAL LOW
CO2: 22
Calcium: 9.4
Chloride: 104
Creatinine, Ser: 0.42
GFR calc non Af Amer: >60
Total Bilirubin: 0.2 — ABNORMAL LOW

## 2011-08-30 LAB — URINE MICROSCOPIC-ADD ON

## 2011-08-30 LAB — URINALYSIS, ROUTINE W REFLEX MICROSCOPIC
Glucose, UA: NEGATIVE
Hgb urine dipstick: NEGATIVE
Ketones, ur: 15 — AB
Protein, ur: NEGATIVE
Urobilinogen, UA: 0.2

## 2011-08-30 LAB — URIC ACID: Uric Acid, Serum: 4.4

## 2011-09-02 LAB — CBC
HCT: 30.5 % — ABNORMAL LOW (ref 36.0–46.0)
MCV: 86 fL (ref 78.0–100.0)
Platelets: 189 10*3/uL (ref 150–400)
Platelets: 221 10*3/uL (ref 150–400)
RBC: 3.54 MIL/uL — ABNORMAL LOW (ref 3.87–5.11)
RBC: 3.89 MIL/uL (ref 3.87–5.11)
WBC: 11.4 10*3/uL — ABNORMAL HIGH (ref 4.0–10.5)
WBC: 8.9 10*3/uL (ref 4.0–10.5)

## 2011-09-02 LAB — BASIC METABOLIC PANEL
BUN: 6 mg/dL (ref 6–23)
GFR calc Af Amer: >60 mL/min (ref >60)
GFR calc non Af Amer: >60 mL/min (ref >60)
Potassium: 3.6 mEq/L (ref 3.5–5.1)
Sodium: 136 mEq/L (ref 135–145)

## 2011-09-02 LAB — RPR: RPR Ser Ql: NONREACTIVE

## 2012-01-14 ENCOUNTER — Ambulatory Visit (INDEPENDENT_AMBULATORY_CARE_PROVIDER_SITE_OTHER): Payer: 59 | Admitting: Physician Assistant

## 2012-01-14 ENCOUNTER — Other Ambulatory Visit: Payer: Self-pay | Admitting: Physician Assistant

## 2012-01-14 VITALS — BP 120/80 | HR 88 | Temp 97.9°F | Resp 18 | Ht 64.0 in | Wt 245.0 lb

## 2012-01-14 DIAGNOSIS — R011 Cardiac murmur, unspecified: Secondary | ICD-10-CM

## 2012-01-14 DIAGNOSIS — L0100 Impetigo, unspecified: Secondary | ICD-10-CM

## 2012-01-14 DIAGNOSIS — J019 Acute sinusitis, unspecified: Secondary | ICD-10-CM

## 2012-01-14 LAB — POCT CBC
Granulocyte percent: 68.6 %G (ref 37–80)
MCH, POC: 62.2 pg — AB (ref 27–31.2)
MCV: 82.1 fL (ref 80–97)
MID (cbc): 0.5 (ref 0–0.9)
MPV: 7.6 fL (ref 0–99.8)
POC LYMPH PERCENT: 25.2 %L (ref 10–50)
POC MID %: 6.2 %M (ref 0–12)
Platelet Count, POC: 336 10*3/uL (ref 142–424)
RDW, POC: 13.7 %

## 2012-01-14 MED ORDER — LEVOFLOXACIN 500 MG PO TABS: 500.0000 mg | ORAL_TABLET | Freq: Every day | ORAL | Status: AC

## 2012-01-14 MED ORDER — IPRATROPIUM BROMIDE 0.06 % NA SOLN: 2.0000 | Freq: Two times a day (BID) | NASAL | Status: DC

## 2012-01-14 MED ORDER — MUPIROCIN 2 % EX OINT: TOPICAL_OINTMENT | Freq: Two times a day (BID) | CUTANEOUS | Status: AC

## 2012-01-14 NOTE — Progress Notes (Signed)
Patient ID: Gina Farrell MRN: [id number], DOB: 24-Nov-1971, 41 y.o. Date of Encounter: 01/14/2012, 12:18 PM  Primary Physician: Dr. Lynford Humphrey   Chief Complaint: Sinus pressure, nasal congestion for 2 weeks, and 3 day history of crusty red nose   HPI: 41 y.o. year old female presents with 2 week history of worsening nasal congestion, sinus pressure, and post nasal drip coupled with 3 day history of mildly erythematous crusty nose. She was seen by her PCP on 2/11 and treated with a Z-pack. Patient states this did not help with her symptoms. Her nasal congestion is thick and green. She states her nose always becomes "red with this crust" when she continually blows it. Has remained afebrile through out. No cough. Hearing is muffled. Sinus pressure worse along maxillary sinuses. Normal appetite. No Gi symptoms.   Never previously told has a murmur. No CP, palpitations, SOB, DOE, dizziness, or lightheadedness.  No leg trauma, sedentary periods, h/o cancer, or tobacco use.  Past Medical History  Diagnosis Date  . Hypertension   . Allergic rhinitis      Home Meds: Prior to Admission medications   Medication Sig Start Date End Date Taking? Authorizing Provider  azithromycin (ZITHROMAX) 500 MG tablet Take 500 mg by mouth daily.   No Historical Provider, MD  montelukast (SINGULAIR) 10 MG tablet Take 10 mg by mouth at bedtime.   Yes Historical Provider, MD  olmesartan-hydrochlorothiazide (BENICAR HCT) 40-25 MG per tablet Take 1 tablet by mouth daily.   Yes Historical Provider, MD    Allergies: No Known Allergies  History   Social History  . Marital Status: Married    Spouse Name: N/A    Number of Children: N/A  . Years of Education: N/A   Occupational History  . Not on file.   Social History Main Topics  . Smoking status: Former Games developer  . Smokeless tobacco: Not on file  . Alcohol Use: Not on file  . Drug Use: Not on file  . Sexually Active: Not on file   Other Topics Concern  .  Not on file   Social History Narrative  . No narrative on file     Review of Systems: Constitutional: negative for chills, fever, night sweats or weight changes Cardiovascular: negative for chest pain or palpitations Respiratory: negative for hemoptysis, wheezing, or shortness of breath Abdominal: negative for abdominal pain, nausea, vomiting or diarrhea Dermatological: negative for rash Neurologic: negative for headache   Physical Exam: Blood pressure 120/80, pulse 88, temperature 97.9 F (36.6 C), temperature source Oral, resp. rate 18, height 5\' 4"  (1.626 m), weight 245 lb (111.131 kg), last menstrual period 12/30/2011., Body mass index is 42.05 kg/(m^2). General: Well developed, well nourished, in no acute distress. Head: Normocephalic, atraumatic, eyes without discharge, sclera non-icteric, nares are congested. Tip of nose with mild erythema and yellow crusting.  Bilateral auditory canals clear, TM's are without perforation, pearly grey with reflective cone of light bilaterally. Bilateral serous effusion behind TM's. Bilateral maxillary sinus TTP. Oral cavity moist, dentition normal. Posterior pharynx with post nasal drip and mild erythema. No peritonsillar abscess or tonsillar exudate. Neck: Supple. No thyromegaly. Full ROM. No lymphadenopathy. Lungs: Clear bilaterally to auscultation without wheezes, rales, or rhonchi. Breathing is unlabored. Heart: RRR with S1 S2. 3/6 systolic murmur. No rubs or gallops appreciated. Msk:  Strength and tone normal for age. Extremities: No clubbing or cyanosis. No edema. Neuro: Alert and oriented X 3. Moves all extremities spontaneously. CNII-XII grossly in tact. Psych:  Responds to questions appropriately with a normal affect.   Labs: Results for orders placed in visit on 01/14/12  POCT CBC      Component Value Range   WBC 8.0  4.6 - 10.2 (K/uL)   Lymph, poc 2.0  0.6 - 3.4    POC LYMPH PERCENT 25.2  10 - 50 (%L)   MID (cbc) 0.5  0 - 0.9     POC MID % 6.2  0 - 12 (%M)   POC Granulocyte 5.5  2 - 6.9    Granulocyte percent 68.6  37 - 80 (%G)   RBC 4.93  4.04 - 5.48 (M/uL)   Hemoglobin 12.9  12.2 - 16.2 (g/dL)   HCT, POC 40.9  81.1 - 47.9 (%)   MCV 82.1  80 - 97 (fL)   MCH, POC 62.2 (*) 27 - 31.2 (pg)   MCHC 31.9  31.8 - 35.4 (g/dL)   RDW, POC 91.4     Platelet Count, POC 336  142 - 424 (K/uL)   MPV 7.6  0 - 99.8 (fL)     ASSESSMENT AND PLAN:  41 y.o. year old female with sinusitis, impetigo, and murmur. 1. Sinusitis -Levaquin 500 mg #10 1 po daily no RF -Atrovent NS 0.06% 2 sprays each nare bid prn #1 no RF -Mucinex -Tylenol/Motrin prn -Rest/fluids 2. Impetigo -Bactroban 2% cream #1 Apply bid no RF 3. Cardiac murmur -Advise patient to follow up with PCP/cardiology -Cardiology referral for eval/tx 4. RTC precautions  Signed, Eula Listen, PA-C 01/14/2012 12:18 PM

## 2012-02-03 ENCOUNTER — Ambulatory Visit: Payer: 59 | Admitting: Internal Medicine

## 2012-02-27 ENCOUNTER — Encounter: Payer: Self-pay | Admitting: Cardiovascular Disease

## 2012-02-27 ENCOUNTER — Ambulatory Visit (INDEPENDENT_AMBULATORY_CARE_PROVIDER_SITE_OTHER): Payer: 59 | Admitting: Cardiovascular Disease

## 2012-02-27 VITALS — BP 122/80 | HR 79 | Ht 64.0 in | Wt 237.1 lb

## 2012-02-27 DIAGNOSIS — R011 Cardiac murmur, unspecified: Secondary | ICD-10-CM

## 2012-02-27 DIAGNOSIS — I10 Essential (primary) hypertension: Secondary | ICD-10-CM

## 2012-02-27 NOTE — Assessment & Plan Note (Signed)
I performed careful auscultation today in the setting in supine positions and I do not appreciate a heart murmur. I suspect she had a flow murmur related to her acute infection a few weeks ago. I note that she needs further workup at this point. She was reassured about her cardiac status.

## 2012-02-27 NOTE — Patient Instructions (Signed)
Your physician recommends that you schedule a follow-up appointment as needed.   Your physician recommends that you continue on your current medications as directed. Please refer to the Current Medication list given to you today.  

## 2012-02-27 NOTE — Progress Notes (Signed)
   HPI:  A 41 year old woman presented for followup evaluation.  She was last seen here in 2008 during her third pregnancy for management of hypertension. She was recently evaluated for sinusitis and was noted to have a new heart murmur. She was referred for further evaluation.  The patient is still having some sinus congestion, but this is much improved from previous. She feels better. She has no past history of cardiac murmur and she has received routine medical care for many years. She has been treated for essential hypertension and notes that her blood pressure is well controlled. She denies chest pain, dyspnea, orthopnea, edema, or PND. She's had no lightheadedness or syncope. She has no history of rheumatic fever. There is no history of valvular or congenital heart disease in the family. The patient does admit to palpitations which are chronic. These are worse when she drinks coffee at night. She has no associated symptoms.  Outpatient Encounter Prescriptions as of 02/27/2012  Medication Sig Dispense Refill  . montelukast (SINGULAIR) 10 MG tablet Take 10 mg by mouth at bedtime.      Marland Kitchen olmesartan-hydrochlorothiazide (BENICAR HCT) 40-25 MG per tablet Take 1 tablet by mouth daily.      Marland Kitchen DISCONTD: ipratropium (ATROVENT) 0.06 % nasal spray Place 2 sprays into the nose 2 (two) times daily.  15 mL  0    No Known Allergies  Past Medical History  Diagnosis Date  . Hypertension   . Allergic rhinitis     ROS: Negative except as per HPI  BP 122/80  Pulse 79  Ht 5\' 4"  (1.626 m)  Wt 107.557 kg (237 lb 1.9 oz)  BMI 40.70 kg/m2  PHYSICAL EXAM: Pt is alert and oriented, very pleasant obese woman in NAD HEENT: normal Neck: JVP - normal, carotids 2+= without bruits Lungs: CTA bilaterally CV: RRR without murmur or gallop Abd: soft, NT, Positive BS, no hepatomegaly Ext: no C/C/E, distal pulses intact and equal Skin: warm/dry no rash  EKG:  Normal sinus rhythm 79 beats per minute, within normal  limits.  ASSESSMENT AND PLAN:

## 2012-02-27 NOTE — Assessment & Plan Note (Signed)
Blood pressure is well controlled. We had a long discussion about the continued benefit of weight loss. She has lost 20 pounds through diet and exercise over the last few months and she will continue to work on this. Further followup per Dr. Oneta Rack.

## 2012-03-02 ENCOUNTER — Other Ambulatory Visit: Payer: Self-pay | Admitting: Internal Medicine

## 2012-03-02 DIAGNOSIS — J329 Chronic sinusitis, unspecified: Secondary | ICD-10-CM

## 2012-03-05 ENCOUNTER — Ambulatory Visit
Admission: RE | Admit: 2012-03-05 | Discharge: 2012-03-05 | Disposition: A | Discharge status: Home / Self Care | Payer: 59 | Source: Ambulatory Visit | Attending: Internal Medicine | Admitting: Internal Medicine

## 2012-03-05 DIAGNOSIS — J329 Chronic sinusitis, unspecified: Secondary | ICD-10-CM

## 2012-06-01 ENCOUNTER — Other Ambulatory Visit: Payer: Self-pay | Admitting: Obstetrics and Gynecology

## 2012-06-01 DIAGNOSIS — Z1231 Encounter for screening mammogram for malignant neoplasm of breast: Secondary | ICD-10-CM

## 2012-06-18 ENCOUNTER — Ambulatory Visit: Payer: 59

## 2012-07-04 ENCOUNTER — Ambulatory Visit
Admission: RE | Admit: 2012-07-04 | Discharge: 2012-07-04 | Disposition: A | Discharge status: Home / Self Care | Payer: 59 | Source: Ambulatory Visit | Attending: Obstetrics and Gynecology | Admitting: Obstetrics and Gynecology

## 2012-07-04 DIAGNOSIS — Z1231 Encounter for screening mammogram for malignant neoplasm of breast: Secondary | ICD-10-CM

## 2012-08-28 LAB — HM MAMMOGRAPHY: HM Mammogram: NORMAL

## 2012-11-09 ENCOUNTER — Encounter (HOSPITAL_BASED_OUTPATIENT_CLINIC_OR_DEPARTMENT_OTHER): Admission: RE | Payer: Self-pay | Source: Ambulatory Visit

## 2012-11-09 ENCOUNTER — Ambulatory Visit (HOSPITAL_BASED_OUTPATIENT_CLINIC_OR_DEPARTMENT_OTHER): Admission: RE | Admit: 2012-11-09 | Payer: 59 | Source: Ambulatory Visit | Admitting: Obstetrics and Gynecology

## 2012-11-09 SURGERY — HYSTEROSCOPY WITH NOVASURE
Anesthesia: Choice

## 2013-01-02 ENCOUNTER — Telehealth: Payer: Self-pay | Admitting: *Deleted

## 2013-01-02 NOTE — Telephone Encounter (Signed)
Lmom.  Per Dr. Cleta Alberts advised pt to take meds as instructed. He received medication management summary from [facility] that pt may not be taking meds as instructed.

## 2013-01-04 ENCOUNTER — Telehealth: Payer: Self-pay | Admitting: Radiology

## 2013-01-04 NOTE — Telephone Encounter (Signed)
I have spoken to patient she received a call from [place] yesterday. We were concerned she may not be taking her BP meds, she states she does take them daily. Her PCP directs this, she is upset we were sent this notice from [facility]. She will contact them.

## 2013-11-23 ENCOUNTER — Encounter: Payer: Self-pay | Admitting: Internal Medicine

## 2013-11-23 DIAGNOSIS — E785 Hyperlipidemia, unspecified: Secondary | ICD-10-CM | POA: Insufficient documentation

## 2013-11-23 DIAGNOSIS — I10 Essential (primary) hypertension: Secondary | ICD-10-CM | POA: Insufficient documentation

## 2013-11-23 DIAGNOSIS — Z9109 Other allergy status, other than to drugs and biological substances: Secondary | ICD-10-CM | POA: Insufficient documentation

## 2013-11-23 DIAGNOSIS — E559 Vitamin D deficiency, unspecified: Secondary | ICD-10-CM | POA: Insufficient documentation

## 2013-11-26 ENCOUNTER — Encounter: Payer: Self-pay | Admitting: Emergency Medicine

## 2014-01-17 ENCOUNTER — Other Ambulatory Visit: Payer: Self-pay | Admitting: Internal Medicine

## 2014-01-20 ENCOUNTER — Other Ambulatory Visit: Payer: Self-pay | Admitting: *Deleted

## 2014-01-20 MED ORDER — MONTELUKAST SODIUM 10 MG PO TABS: 10.0000 mg | ORAL_TABLET | Freq: Every day | ORAL | Status: DC

## 2014-01-23 ENCOUNTER — Ambulatory Visit: Payer: Self-pay | Admitting: Emergency Medicine

## 2014-01-29 ENCOUNTER — Encounter: Payer: Self-pay | Admitting: Emergency Medicine

## 2014-01-29 ENCOUNTER — Ambulatory Visit (INDEPENDENT_AMBULATORY_CARE_PROVIDER_SITE_OTHER): Payer: 59 | Admitting: Emergency Medicine

## 2014-01-29 VITALS — BP 128/82 | HR 76 | Temp 98.6°F | Resp 18 | Ht 64.0 in | Wt 236.0 lb

## 2014-01-29 DIAGNOSIS — R7309 Other abnormal glucose: Secondary | ICD-10-CM

## 2014-01-29 DIAGNOSIS — K219 Gastro-esophageal reflux disease without esophagitis: Secondary | ICD-10-CM

## 2014-01-29 DIAGNOSIS — E538 Deficiency of other specified B group vitamins: Secondary | ICD-10-CM

## 2014-01-29 DIAGNOSIS — Z119 Encounter for screening for infectious and parasitic diseases, unspecified: Secondary | ICD-10-CM

## 2014-01-29 DIAGNOSIS — E781 Pure hyperglyceridemia: Secondary | ICD-10-CM

## 2014-01-29 DIAGNOSIS — I10 Essential (primary) hypertension: Secondary | ICD-10-CM

## 2014-01-29 DIAGNOSIS — E669 Obesity, unspecified: Secondary | ICD-10-CM

## 2014-01-29 LAB — CBC WITH DIFFERENTIAL/PLATELET
BASOS ABS: 0 10*3/uL (ref 0.0–0.1)
BASOS PCT: 0 % (ref 0–1)
EOS ABS: 0.1 10*3/uL (ref 0.0–0.7)
Eosinophils Relative: 2 % (ref 0–5)
HCT: 40.1 % (ref 36.0–46.0)
Hemoglobin: 13.7 g/dL (ref 12.0–15.0)
Lymphocytes Relative: 21 % (ref 12–46)
Lymphs Abs: 1.4 10*3/uL (ref 0.7–4.0)
MCH: 27.3 pg (ref 26.0–34.0)
MCHC: 34.2 g/dL (ref 30.0–36.0)
MCV: 79.9 fL (ref 78.0–100.0)
Monocytes Absolute: 0.7 10*3/uL (ref 0.1–1.0)
Monocytes Relative: 10 % (ref 3–12)
NEUTROS PCT: 67 % (ref 43–77)
Neutro Abs: 4.6 10*3/uL (ref 1.7–7.7)
PLATELETS: 245 10*3/uL (ref 150–400)
RBC: 5.02 MIL/uL (ref 3.87–5.11)
RDW: 13.9 % (ref 11.5–15.5)
WBC: 6.8 10*3/uL (ref 4.0–10.5)

## 2014-01-29 LAB — HEMOGLOBIN A1C
Hgb A1c MFr Bld: 5.2 % (ref <5.7)
Mean Plasma Glucose: 103 mg/dL (ref <117)

## 2014-01-29 MED ORDER — FAMCICLOVIR 500 MG PO TABS: 500.0000 mg | ORAL_TABLET | Freq: Three times a day (TID) | ORAL | Status: DC

## 2014-01-29 NOTE — Patient Instructions (Signed)
Diet for Gastroesophageal Reflux Disease, Adult Reflux (acid reflux) is when acid from your stomach flows up into the esophagus. When acid comes in contact with the esophagus, the acid causes irritation and soreness (inflammation) in the esophagus. When reflux happens often or so severely that it causes damage to the esophagus, it is called gastroesophageal reflux disease (GERD). Nutrition therapy can help ease the discomfort of GERD. FOODS OR DRINKS TO AVOID OR LIMIT  Smoking or chewing tobacco. Nicotine is one of the most potent stimulants to acid production in the gastrointestinal tract.  Caffeinated and decaffeinated coffee and black tea.  Regular or low-calorie carbonated beverages or energy drinks (caffeine-free carbonated beverages are allowed).   Strong spices, such as black pepper, white pepper, red pepper, cayenne, curry powder, and chili powder.  Peppermint or spearmint.  Chocolate.  High-fat foods, including meats and fried foods. Extra added fats including oils, butter, salad dressings, and nuts. Limit these to less than 8 tsp per day.  Fruits and vegetables if they are not tolerated, such as citrus fruits or tomatoes.  Alcohol.  Any food that seems to aggravate your condition. If you have questions regarding your diet, call your caregiver or a registered dietitian. OTHER THINGS THAT MAY HELP GERD INCLUDE:   Eating your meals slowly, in a relaxed setting.  Eating 5 to 6 small meals per day instead of 3 large meals.  Eliminating food for a period of time if it causes distress.  Not lying down until 3 hours after eating a meal.  Keeping the head of your bed raised 6 to 9 inches (15 to 23 cm) by using a foam wedge or blocks under the legs of the bed. Lying flat may make symptoms worse.  Being physically active. Weight loss may be helpful in reducing reflux in overweight or obese adults.  Wear loose fitting clothing EXAMPLE MEAL PLAN This meal plan is approximately  2,000 calories based on [email or web address] meal planning guidelines. Breakfast   cup cooked oatmeal.  1 cup strawberries.  1 cup low-fat milk.  1 oz almonds. Snack  1 cup cucumber slices.  6 oz yogurt (made from low-fat or fat-free milk). Lunch  2 slice whole-wheat bread.  2 oz sliced [place].  2 tsp mayonnaise.  1 cup blueberries.  1 cup snap peas. Snack  6 whole-wheat crackers.  1 oz string cheese. Dinner   cup brown rice.  1 cup mixed veggies.  1 tsp olive oil.  3 oz grilled fish. Document Released: 11/14/2005 Document Revised: 02/06/2012 Document Reviewed: 09/30/2011 [facility] Patient Information 2014 [place], [place].   Bad carbs also include fruit juice, alcohol, and sweet tea. These are empty calories that do not signal to your brain that you are full.   Please remember the good carbs are still carbs which convert into sugar. So please measure them out no more than 1/2-1 cup of rice, oatmeal, pasta, and beans.  Veggies are however free foods! Pile them on.   I like lean protein at every meal such as chicken, [place], pork chops, cottage cheese, etc. Just do not fry these meats and please center your meal around vegetable, the meats should be a side dish.   No all fruit is created equal. Please see the list below, the fruit at the bottom is higher in sugars than the fruit at the top   We want weight loss that will last so you should lose 1-2 pounds a week.  THAT IS IT! Please pick THREE things a month  to change. Once it is a habit check off the item. Then pick another three items off the list to become habits.  If you are already doing a habit on the list GREAT!  Cross that item off! o Don't drink your calories. Ie, alcohol, soda, fruit juice, and sweet tea.  o Drink more water. Drink a glass when you feel hungry or before each meal.  o Eat breakfast - Complex carb and protein (likeDannon light and fit yogurt, oatmeal, fruit, eggs, [place] bacon). o Measure  your cereal.  Eat no more than one cup a day. (ie [place]) o Eat an apple a day. o Add a vegetable a day. o Try a new vegetable a month. o Use Pam! Stop using oil or butter to cook. o Don't finish your plate or use smaller plates. o Share your dessert. o Eat sugar free Jello for dessert or frozen grapes. o Don't eat 2-3 hours before bed. o Switch to whole wheat bread, pasta, and brown rice. o Make healthier choices when you eat out. No fries! o Pick baked chicken, NOT fried. o Don't forget to SLOW DOWN when you eat. It is not going anywhere.  o Take the stairs. o Park far away in the parking lot o [employer] (or weights) for 10 minutes while watching TV. o Walk at work for 10 minutes during break. o Walk outside 1 time a week with your friend, kids, dog, or significant other. o Start a walking group at [place] as much as you can tolerate.  o Keep a food diary. o Weigh yourself daily. o Walk for 15 minutes 3 days per week. o Cook at home more often and eat out less.  If life happens and you go back to old habits, it is okay.  Just start over. You can do it!   If you experience chest pain, get short of breath, or tired during the exercise, please stop immediately and inform your doctor.

## 2014-01-29 NOTE — Progress Notes (Signed)
Subjective:    Patient ID: Gina Farrell, female    DOB: [birth year], 43 y.o.   MRN: [id number]  HPI Comments: 43 yo plesant obese female presents for 3 month F/U for HTN, Cholesterol, Pre-Dm, D. Deficient. She did not start [employer] she has increased dietary intake. She added magnesium at last OV. She wants to lose weight, she has d/c soda and fried foods. She has lost 10#. She is exercising 2-6 days a week. She has noticed increased reflux with better diet x 2-3 weeks. She has had episodes with reflux at Q[facility], that awaken from sleep.Marland Kitchen Nexium OTC helps. She did not start MF AD at last OV. LAST LABS  POTASSIUM 3.4 BS 103 T 182 TG 164 L 104 A1C 5 MAG 2 INSULIN 197 D91  She has a reoccurring rash/ blister cluster on external nose after severe infection or sunburns. She has had since age 63 and seen several specialist w/o diagnosis. She notes it gets sore then blisters and tingle comes, then scab. Sore lasts x 1 week. She has not been able to get any relief with OTC. She denies Hx of HSV/ fever blisters.    Medication List       This list is accurate as of: 01/29/14 10:43 AM.  Always use your most recent med list.               aspirin 81 MG tablet  Take 81 mg by mouth daily.     Fish Oil 1000 MG Caps  Take 100 mg by mouth daily.     fluticasone 50 MCG/ACT nasal spray  Commonly known as:  FLONASE  Place into both nostrils as needed.     magnesium oxide 400 MG tablet  Commonly known as:  MAG-OX  400 mg every other day.     montelukast 10 MG tablet  Commonly known as:  SINGULAIR  Take 1 tablet (10 mg total) by mouth at bedtime.     MULTIVITAMIN PO  Take by mouth daily.     olmesartan-hydrochlorothiazide 40-25 MG per tablet  Commonly known as:  BENICAR HCT  Take 1 tablet by mouth daily.     SUPER B COMPLEX PO  Take by mouth daily.         Allergies  Allergen Reactions  . Red Yeast Rice [Cholestin]     Cramps/Heartburn   Past Medical History  Diagnosis Date  .  Hypertension   . Allergic rhinitis   . Hyperlipidemia   . Vitamin D deficiency   . Miscarriage 1998/2008     Review of Systems  Gastrointestinal:       Reflux  Skin: Positive for rash.  All other systems reviewed and are negative.   BP 128/82  Pulse 76  Temp(Src) 98.6 F (37 C) (Temporal)  Resp 18  Ht 5\' 4"  (1.626 m)  Wt 236 lb (107.049 kg)  BMI 40.49 kg/m2  LMP 01/26/2014     Objective:   Physical Exam  Nursing note and vitals reviewed. Constitutional: She is oriented to person, place, and time. She appears well-developed and well-nourished. No distress.  Obese   HENT:  Head: Normocephalic and atraumatic.    Right Ear: External ear normal.  Left Ear: External ear normal.  Nose: Nose normal.  Mouth/Throat: Oropharynx is clear and moist.  Eyes: Conjunctivae and EOM are normal.  Neck: Normal range of motion. Neck supple. No JVD present. No thyromegaly present.  Cardiovascular: Normal rate, regular rhythm, normal heart sounds and  intact distal pulses.   Pulmonary/Chest: Effort normal and breath sounds normal.  Abdominal: Soft. Bowel sounds are normal. She exhibits no distension and no mass. There is no tenderness. There is no rebound and no guarding.  Musculoskeletal: Normal range of motion. She exhibits no edema and no tenderness.  Lymphadenopathy:    She has no cervical adenopathy.  Neurological: She is alert and oriented to person, place, and time. No cranial nerve deficit.  Skin: Skin is warm and dry. No rash noted. No erythema. No pallor.  Psychiatric: She has a normal mood and affect. Her behavior is normal. Judgment and thought content normal.          Assessment & Plan:  1.  3 month F/U for Obesity, HTN, Cholesterol, Pre-Dm, D. Deficient. Needs healthy diet, cardio QD and obtain healthy weight. Check Labs, Check BP if >130/80 call office 2. GERD- Bland diet x 1 week/ Continue Nexium AD/ Check labs may need GI REF, w/c if SX increase or ER. 3. ?HSV- ON  NOSE- check labs trial of Famvir 500 mg AD

## 2014-01-30 LAB — LIPID PANEL
Cholesterol: 168 mg/dL (ref 0–200)
HDL: 45 mg/dL (ref >39)
LDL CALC: 104 mg/dL — AB (ref 0–99)
Total CHOL/HDL Ratio: 3.7 Ratio
Triglycerides: 97 mg/dL (ref <150)
VLDL: 19 mg/dL (ref 0–40)

## 2014-01-30 LAB — BASIC METABOLIC PANEL WITH GFR
BUN: 15 mg/dL (ref 6–23)
CALCIUM: 9.5 mg/dL (ref 8.4–10.5)
CO2: 26 meq/L (ref 19–32)
Chloride: 99 mEq/L (ref 96–112)
Creat: 0.62 mg/dL (ref 0.50–1.10)
GFR, Est Non African American: >89 mL/min
Glucose, Bld: 81 mg/dL (ref 70–99)
POTASSIUM: 4.1 meq/L (ref 3.5–5.3)
SODIUM: 143 meq/L (ref 135–145)

## 2014-01-30 LAB — H. PYLORI ANTIBODY, IGG

## 2014-01-30 LAB — HSV(HERPES SIMPLEX VRS) I + II AB-IGG
HSV 1 Glycoprotein G Ab, IgG: 7.78 IV — ABNORMAL HIGH
HSV 2 Glycoprotein G Ab, IgG: <0.1 IV

## 2014-01-30 LAB — HEPATIC FUNCTION PANEL
ALK PHOS: 66 U/L (ref 39–117)
ALT: 34 U/L (ref 0–35)
AST: 26 U/L (ref 0–37)
Albumin: 4.1 g/dL (ref 3.5–5.2)
BILIRUBIN INDIRECT: 0.3 mg/dL (ref 0.2–1.2)
Bilirubin, Direct: 0.1 mg/dL (ref 0.0–0.3)
TOTAL PROTEIN: 7.3 g/dL (ref 6.0–8.3)
Total Bilirubin: 0.4 mg/dL (ref 0.2–1.2)

## 2014-01-30 LAB — INSULIN, FASTING: INSULIN FASTING, SERUM: 21 u[IU]/mL (ref 3–28)

## 2014-02-21 ENCOUNTER — Other Ambulatory Visit: Payer: Self-pay | Admitting: Internal Medicine

## 2014-02-21 ENCOUNTER — Other Ambulatory Visit: Payer: Self-pay | Admitting: Emergency Medicine

## 2014-05-05 ENCOUNTER — Encounter: Payer: Self-pay | Admitting: Emergency Medicine

## 2014-07-31 ENCOUNTER — Ambulatory Visit: Payer: Self-pay | Admitting: Physician Assistant

## 2014-08-06 ENCOUNTER — Encounter: Payer: Self-pay | Admitting: Internal Medicine

## 2014-08-29 LAB — HM PAP SMEAR: HM Pap smear: NORMAL

## 2014-08-29 LAB — HM MAMMOGRAPHY: HM MAMMO: NORMAL

## 2014-09-01 ENCOUNTER — Other Ambulatory Visit: Payer: Self-pay | Admitting: Emergency Medicine

## 2014-09-12 ENCOUNTER — Other Ambulatory Visit: Payer: Self-pay

## 2014-09-25 ENCOUNTER — Other Ambulatory Visit: Payer: Self-pay | Admitting: Obstetrics and Gynecology

## 2014-09-25 LAB — HM MAMMOGRAPHY: HM MAMMO: NORMAL

## 2014-09-25 LAB — HM PAP SMEAR: HM Pap smear: NORMAL

## 2014-09-26 LAB — CYTOLOGY - PAP

## 2014-11-26 ENCOUNTER — Encounter: Payer: Self-pay | Admitting: Emergency Medicine

## 2014-12-03 ENCOUNTER — Other Ambulatory Visit: Payer: Self-pay | Admitting: *Deleted

## 2014-12-03 MED ORDER — MONTELUKAST SODIUM 10 MG PO TABS: 10.0000 mg | ORAL_TABLET | Freq: Every day | ORAL | Status: DC

## 2015-01-01 ENCOUNTER — Ambulatory Visit: Payer: Self-pay | Admitting: Internal Medicine

## 2015-01-13 ENCOUNTER — Encounter: Payer: Self-pay | Admitting: Internal Medicine

## 2015-01-13 ENCOUNTER — Ambulatory Visit (INDEPENDENT_AMBULATORY_CARE_PROVIDER_SITE_OTHER): Payer: 59 | Admitting: Internal Medicine

## 2015-01-13 VITALS — BP 124/82 | HR 84 | Temp 98.4°F | Wt 254.0 lb

## 2015-01-13 DIAGNOSIS — K219 Gastro-esophageal reflux disease without esophagitis: Secondary | ICD-10-CM

## 2015-01-13 DIAGNOSIS — Z91048 Other nonmedicinal substance allergy status: Secondary | ICD-10-CM

## 2015-01-13 DIAGNOSIS — Z131 Encounter for screening for diabetes mellitus: Secondary | ICD-10-CM | POA: Diagnosis not present

## 2015-01-13 DIAGNOSIS — E785 Hyperlipidemia, unspecified: Secondary | ICD-10-CM | POA: Diagnosis not present

## 2015-01-13 DIAGNOSIS — I10 Essential (primary) hypertension: Secondary | ICD-10-CM | POA: Diagnosis not present

## 2015-01-13 DIAGNOSIS — Z9109 Other allergy status, other than to drugs and biological substances: Secondary | ICD-10-CM

## 2015-01-13 DIAGNOSIS — B028 Zoster with other complications: Secondary | ICD-10-CM

## 2015-01-13 LAB — CBC
HEMATOCRIT: 39 % (ref 36.0–46.0)
HEMOGLOBIN: 13.1 g/dL (ref 12.0–15.0)
MCHC: 33.7 g/dL (ref 30.0–36.0)
MCV: 80.8 fl (ref 78.0–100.0)
PLATELETS: 263 10*3/uL (ref 150.0–400.0)
RBC: 4.82 Mil/uL (ref 3.87–5.11)
RDW: 13.8 % (ref 11.5–15.5)
WBC: 7.7 10*3/uL (ref 4.0–10.5)

## 2015-01-13 LAB — COMPREHENSIVE METABOLIC PANEL
ALK PHOS: 67 U/L (ref 39–117)
ALT: 16 U/L (ref 0–35)
AST: 16 U/L (ref 0–37)
Albumin: 3.8 g/dL (ref 3.5–5.2)
BILIRUBIN TOTAL: 0.3 mg/dL (ref 0.2–1.2)
BUN: 17 mg/dL (ref 6–23)
CO2: 30 mEq/L (ref 19–32)
CREATININE: 0.68 mg/dL (ref 0.40–1.20)
Calcium: 9.9 mg/dL (ref 8.4–10.5)
Chloride: 103 mEq/L (ref 96–112)
GFR: 100.34 mL/min (ref >60.00)
GLUCOSE: 89 mg/dL (ref 70–99)
Potassium: 3.7 mEq/L (ref 3.5–5.1)
SODIUM: 137 meq/L (ref 135–145)
Total Protein: 7.2 g/dL (ref 6.0–8.3)

## 2015-01-13 LAB — LIPID PANEL
CHOLESTEROL: 183 mg/dL (ref 0–200)
HDL: 46.3 mg/dL (ref >39.00)
LDL Cholesterol: 120 mg/dL — ABNORMAL HIGH (ref 0–99)
NonHDL: 136.7
Total CHOL/HDL Ratio: 4
Triglycerides: 86 mg/dL (ref 0.0–149.0)
VLDL: 17.2 mg/dL (ref 0.0–40.0)

## 2015-01-13 LAB — HEMOGLOBIN A1C: HEMOGLOBIN A1C: 5.4 % (ref 4.6–6.5)

## 2015-01-13 MED ORDER — MONTELUKAST SODIUM 10 MG PO TABS: 10.0000 mg | ORAL_TABLET | Freq: Every day | ORAL | Status: DC

## 2015-01-13 MED ORDER — OLMESARTAN MEDOXOMIL-HCTZ 40-25 MG PO TABS: 1.0000 | ORAL_TABLET | Freq: Every day | ORAL | Status: DC

## 2015-01-13 NOTE — Assessment & Plan Note (Signed)
LDL was on slightly elevated prior She was advised to take red yeast rice- broke out in rash Now only taking Fish oil daily Does not consume a low fat diet Will check CMET and Lipid profile today Handout given on low fat diet

## 2015-01-13 NOTE — Progress Notes (Signed)
HPI  Pt presents to the clinic today to establish care and for management of the conditions listed below. She is transferring care from Dr. Melford Aase at [facility].  HTN: BP well controlled on Benicar-HCT. BP today 124/82.  Allergies: Taking Singulair daily and Flonase as needed. Denies breakthrough symptoms.  GERD: Symptoms controlled with Nexium OTC.  She takes this medication only as needed. She reports she has heartburn 2-3 times per month. Her reflux is triggered by [place], spicy food, tomato based products.  HLD: Last lipid profile 01/2014. She takes Fish Oil daily.  Obesity: BMI of 43. Does not adhere to any particular diet. Does not exercise.   Gets Herpes lesions (HSV 1) on her nose. Occurs every 3 months but has not had in the last year. Typically occurred with a sinus infection or cold. She takes Famvir when it occurs.  Flu: 08/2014 Tetanus: 2012 LMP: 12/29/14 Pap Smear: 08/2014 Mammogram: 08/2014 Vision Screening: 12/26/14 Dentist: biannually  Past Medical History  Diagnosis Date  . Hypertension   . Allergic rhinitis   . Vitamin D deficiency   . Miscarriage 1998/2008  . Chicken pox     Current Outpatient Prescriptions  Medication Sig Dispense Refill  . aspirin 81 MG tablet Take 81 mg by mouth daily.    . B Complex-C (SUPER B COMPLEX PO) Take by mouth daily.    Marland Kitchen BENICAR HCT 40-25 MG per tablet TAKE 1 TABLET BY MOUTH ONCE DAILY. 90 tablet 1  . esomeprazole (NEXIUM) 20 MG packet Take 20 mg by mouth daily before breakfast.    . famciclovir (FAMVIR) 500 MG tablet Take 1 tablet (500 mg total) by mouth 3 (three) times daily. 21 tablet 1  . fluticasone (FLONASE) 50 MCG/ACT nasal spray Place into both nostrils as needed.     . magnesium oxide (MAG-OX) 400 MG tablet 400 mg every other day.    . montelukast (SINGULAIR) 10 MG tablet Take 1 tablet (10 mg total) by mouth at bedtime. 30 tablet 0  . Multiple Vitamins-Minerals (MULTIVITAMIN PO) Take by mouth  daily.    . Omega-3 Fatty Acids (FISH OIL) 1000 MG CAPS Take 100 mg by mouth daily.     No current facility-administered medications for this visit.    Allergies  Allergen Reactions  . Red Yeast Rice [Cholestin]     Cramps/Heartburn    Family History  Problem Relation Age of Onset  . Hypertension Mother   . Stroke Mother   . Atrial fibrillation Mother   . Diabetes Mother   . Rheum arthritis Maternal Grandmother   . Coronary artery disease Maternal Grandmother   . Hypertension Maternal Grandmother     History   Social History  . Marital Status: Married    Spouse Name: N/A  . Number of Children: N/A  . Years of Education: N/A   Occupational History  . Not on file.   Social History Main Topics  . Smoking status: Former Smoker    Quit date: 11/28/2001  . Smokeless tobacco: Never Used  . Alcohol Use: 0.0 oz/week    0 Standard drinks or equivalent per week     Comment: rare  . Drug Use: Not on file  . Sexual Activity: Not on file   Other Topics Concern  . Not on file   Social History Narrative    ROS:  Constitutional: Denies fever, malaise, fatigue, headache or abrupt weight changes.  HEENT: Denies eye pain, eye redness, ear pain, ringing in the  ears, wax buildup, runny nose, nasal congestion, bloody nose, or sore throat. Respiratory: Denies difficulty breathing, shortness of breath, cough or sputum production.   Cardiovascular: Denies chest pain, chest tightness, palpitations or swelling in the hands or feet.  Gastrointestinal: Denies abdominal pain, bloating, constipation, diarrhea or blood in the stool.  Skin: Denies redness, rashes, lesions or ulcercations.  Neurological: Denies dizziness, difficulty with memory, difficulty with speech or problems with balance and coordination.  Psych: Denies anxiety, depression, SI/HI.  No other specific complaints in a complete review of systems (except as listed in HPI above).  PE:  BP 124/82 mmHg  Pulse 84   Temp(Src) 98.4 F (36.9 C) (Oral)  Wt 254 lb (115.214 kg)  SpO2 98%  LMP 12/29/2014  Wt Readings from Last 3 Encounters:  01/13/15 254 lb (115.214 kg)  01/29/14 236 lb (107.049 kg)  02/27/12 237 lb 1.9 oz (107.557 kg)    General: Appears her stated age, obese in NAD. Skin: Warm, dry and intact. No lesion or ulcerations noted. HEENT: Head: normal shape and size; Eyes: sclera white, no icterus, conjunctiva pink, PERRLA and EOMs intact; Ears: Tm's gray and intact, normal light reflex; Nose: mucosa pink and moist, septum midline; Throat/Mouth: Teeth present, mucosa pink and moist, no lesions or ulcerations noted.  Cardiovascular: Normal rate and rhythm. S1,S2 noted.  No murmur, rubs or gallops noted. No JVD or BLE edema. No carotid bruits noted. Pulmonary/Chest: Normal effort and positive vesicular breath sounds. No respiratory distress. No wheezes, rales or ronchi noted.  Abdomen: Soft and nontender. Normal bowel sounds, no bruits noted. No distention or masses noted. Liver, spleen and kidneys non palpable. Neurological: Alert and oriented. Psychiatric: Mood and affect normal. Behavior is normal. Judgment and thought content normal.     BMET    Component Value Date/Time   NA 143 01/29/2014 1053   K 4.1 01/29/2014 1053   CL 99 01/29/2014 1053   CO2 26 01/29/2014 1053   GLUCOSE 81 01/29/2014 1053   BUN 15 01/29/2014 1053   CREATININE 0.62 01/29/2014 1053   CREATININE 0.53 10/30/2008 1049   CALCIUM 9.5 01/29/2014 1053   GFRNONAA >89 01/29/2014 1053   GFRNONAA >60 10/30/2008 1049   GFRAA >89 01/29/2014 1053   GFRAA  10/30/2008 1049    >60        The eGFR has been calculated using the MDRD equation. This calculation has not been validated in all clinical    Lipid Panel     Component Value Date/Time   CHOL 168 01/29/2014 1053   TRIG 97 01/29/2014 1053   HDL 45 01/29/2014 1053   CHOLHDL 3.7 01/29/2014 1053   VLDL 19 01/29/2014 1053   LDLCALC 104* 01/29/2014 1053     CBC    Component Value Date/Time   WBC 6.8 01/29/2014 1053   WBC 8.0 01/14/2012 1139   RBC 5.02 01/29/2014 1053   RBC 4.93 01/14/2012 1139   HGB 13.7 01/29/2014 1053   HGB 12.9 01/14/2012 1139   HCT 40.1 01/29/2014 1053   HCT 40.5 01/14/2012 1139   PLT 245 01/29/2014 1053   MCV 79.9 01/29/2014 1053   MCV 82.1 01/14/2012 1139   MCH 27.3 01/29/2014 1053   MCH 62.2* 01/14/2012 1139   MCHC 34.2 01/29/2014 1053   MCHC 31.9 01/14/2012 1139   RDW 13.9 01/29/2014 1053   LYMPHSABS 1.4 01/29/2014 1053   MONOABS 0.7 01/29/2014 1053   EOSABS 0.1 01/29/2014 1053   BASOSABS 0.0 01/29/2014 1053  Hgb A1C Lab Results  Component Value Date   HGBA1C 5.2 01/29/2014     Assessment and Plan:

## 2015-01-13 NOTE — Assessment & Plan Note (Signed)
Well controlled on Benicar-HCT Will check CBC and CMET today Medication refilled today

## 2015-01-13 NOTE — Assessment & Plan Note (Signed)
Continue Singulair daily and Flonase prn Singulair refilled today

## 2015-01-13 NOTE — Assessment & Plan Note (Signed)
Occasional Avoid foods that trigger reflux Continue Nexium as needed

## 2015-01-13 NOTE — Assessment & Plan Note (Signed)
Flares up on nose with illness Continue Famvir as needed

## 2015-01-13 NOTE — Patient Instructions (Signed)
Fat and Cholesterol Control Diet Fat and cholesterol levels in your blood and organs are influenced by your diet. High levels of fat and cholesterol may lead to diseases of the heart, small and large blood vessels, gallbladder, liver, and pancreas. CONTROLLING FAT AND CHOLESTEROL WITH DIET Although exercise and lifestyle factors are important, your diet is key. That is because certain foods are known to raise cholesterol and others to lower it. The goal is to balance foods for their effect on cholesterol and more importantly, to replace saturated and trans fat with other types of fat, such as monounsaturated fat, polyunsaturated fat, and omega-3 fatty acids. On average, a person should consume no more than 15 to 17 g of saturated fat daily. Saturated and trans fats are considered "bad" fats, and they will raise LDL cholesterol. Saturated fats are primarily found in animal products such as meats, butter, and cream. However, that does not mean you need to give up all your favorite foods. Today, there are good tasting, low-fat, low-cholesterol substitutes for most of the things you like to eat. Choose low-fat or nonfat alternatives. Choose round or loin cuts of red meat. These types of cuts are lowest in fat and cholesterol. Chicken (without the skin), fish, veal, and ground turkey breast are great choices. Eliminate fatty meats, such as hot dogs and salami. Even shellfish have little or no saturated fat. Have a 3 oz (85 g) portion when you eat lean meat, poultry, or fish. Trans fats are also called "partially hydrogenated oils." They are oils that have been scientifically manipulated so that they are solid at room temperature resulting in a longer shelf life and improved taste and texture of foods in which they are added. Trans fats are found in stick margarine, some tub margarines, cookies, crackers, and baked goods.  When baking and cooking, oils are a great substitute for butter. The monounsaturated oils are  especially beneficial since it is believed they lower LDL and raise HDL. The oils you should avoid entirely are saturated tropical oils, such as coconut and palm.  Remember to eat a lot from food groups that are naturally free of saturated and trans fat, including fish, fruit, vegetables, beans, grains (barley, rice, couscous, bulgur wheat), and pasta (without cream sauces).  IDENTIFYING FOODS THAT LOWER FAT AND CHOLESTEROL  Soluble fiber may lower your cholesterol. This type of fiber is found in fruits such as apples, vegetables such as broccoli, potatoes, and carrots, legumes such as beans, peas, and lentils, and grains such as barley. Foods fortified with plant sterols (phytosterol) may also lower cholesterol. You should eat at least 2 g per day of these foods for a cholesterol lowering effect.  Read package labels to identify low-saturated fats, trans fat free, and low-fat foods at the supermarket. Select cheeses that have only 2 to 3 g saturated fat per ounce. Use a heart-healthy tub margarine that is free of trans fats or partially hydrogenated oil. When buying baked goods (cookies, crackers), avoid partially hydrogenated oils. Breads and muffins should be made from whole grains (whole-wheat or whole oat flour, instead of "flour" or "enriched flour"). Buy non-creamy canned soups with reduced salt and no added fats.  FOOD PREPARATION TECHNIQUES  Never deep-fry. If you must fry, either stir-fry, which uses very little fat, or use non-stick cooking sprays. When possible, broil, bake, or roast meats, and steam vegetables. Instead of putting butter or margarine on vegetables, use lemon and herbs, applesauce, and cinnamon (for squash and sweet potatoes). Use nonfat   yogurt, salsa, and low-fat dressings for salads.  LOW-SATURATED FAT / LOW-FAT FOOD SUBSTITUTES Meats / Saturated Fat (g)  Avoid: Steak, marbled (3 oz/85 g) / 11 g  Choose: Steak, lean (3 oz/85 g) / 4 g  Avoid: Hamburger (3 oz/85 g) / 7  g  Choose: Hamburger, lean (3 oz/85 g) / 5 g  Avoid: Ham (3 oz/85 g) / 6 g  Choose: Ham, lean cut (3 oz/85 g) / 2.4 g  Avoid: Chicken, with skin, dark meat (3 oz/85 g) / 4 g  Choose: Chicken, skin removed, dark meat (3 oz/85 g) / 2 g  Avoid: Chicken, with skin, light meat (3 oz/85 g) / 2.5 g  Choose: Chicken, skin removed, light meat (3 oz/85 g) / 1 g Dairy / Saturated Fat (g)  Avoid: Whole milk (1 cup) / 5 g  Choose: Low-fat milk, 2% (1 cup) / 3 g  Choose: Low-fat milk, 1% (1 cup) / 1.5 g  Choose: Skim milk (1 cup) / 0.3 g  Avoid: Hard cheese (1 oz/28 g) / 6 g  Choose: Skim milk cheese (1 oz/28 g) / 2 to 3 g  Avoid: Cottage cheese, 4% fat (1 cup) / 6.5 g  Choose: Low-fat cottage cheese, 1% fat (1 cup) / 1.5 g  Avoid: Ice cream (1 cup) / 9 g  Choose: Sherbet (1 cup) / 2.5 g  Choose: Nonfat frozen yogurt (1 cup) / 0.3 g  Choose: Frozen fruit bar / trace  Avoid: Whipped cream (1 tbs) / 3.5 g  Choose: Nondairy whipped topping (1 tbs) / 1 g Condiments / Saturated Fat (g)  Avoid: Mayonnaise (1 tbs) / 2 g  Choose: Low-fat mayonnaise (1 tbs) / 1 g  Avoid: Butter (1 tbs) / 7 g  Choose: Extra light margarine (1 tbs) / 1 g  Avoid: Coconut oil (1 tbs) / 11.8 g  Choose: Olive oil (1 tbs) / 1.8 g  Choose: Corn oil (1 tbs) / 1.7 g  Choose: Safflower oil (1 tbs) / 1.2 g  Choose: Sunflower oil (1 tbs) / 1.4 g  Choose: Soybean oil (1 tbs) / 2.4 g  Choose: Canola oil (1 tbs) / 1 g Document Released: 11/14/2005 Document Revised: 03/11/2013 Document Reviewed: 02/12/2014 [employer] Patient Information 2015 [employer]. This information is not intended to replace advice given to you by your health care provider. Make sure you discuss any questions you have with your health care provider.  

## 2015-01-13 NOTE — Assessment & Plan Note (Signed)
Encouraged her to consume 6 small meals per day- high protein, low carb, fresh fruits and veggies Incorporate exercise in 3 days per week to start off with Advised her to download the My Fitness Pal app to keep track of her calories and nutrition

## 2015-01-13 NOTE — Progress Notes (Signed)
Pre visit review using our clinic review tool, if applicable. No additional management support is needed unless otherwise documented below in the visit note. 

## 2015-01-14 ENCOUNTER — Telehealth: Payer: Self-pay | Admitting: Internal Medicine

## 2015-01-14 NOTE — Telephone Encounter (Signed)
EMMI EMAILED  °

## 2015-02-11 ENCOUNTER — Ambulatory Visit: Payer: 59 | Admitting: Psychology

## 2015-02-17 ENCOUNTER — Ambulatory Visit (INDEPENDENT_AMBULATORY_CARE_PROVIDER_SITE_OTHER): Payer: Commercial Managed Care - PPO | Admitting: Psychology

## 2015-02-17 DIAGNOSIS — F4323 Adjustment disorder with mixed anxiety and depressed mood: Secondary | ICD-10-CM | POA: Diagnosis not present

## 2015-03-11 ENCOUNTER — Ambulatory Visit: Payer: Commercial Managed Care - PPO | Admitting: Psychology

## 2015-03-12 ENCOUNTER — Encounter: Payer: Self-pay | Admitting: Internal Medicine

## 2015-03-13 MED ORDER — CICLOPIROX OLAMINE 0.77 % EX CREA: TOPICAL_CREAM | Freq: Two times a day (BID) | CUTANEOUS | Status: DC

## 2015-04-07 ENCOUNTER — Ambulatory Visit: Payer: Commercial Managed Care - PPO | Admitting: Psychology

## 2015-05-11 ENCOUNTER — Encounter: Payer: Self-pay | Admitting: Emergency Medicine

## 2015-05-21 ENCOUNTER — Ambulatory Visit (INDEPENDENT_AMBULATORY_CARE_PROVIDER_SITE_OTHER): Payer: Commercial Managed Care - PPO | Admitting: Podiatry

## 2015-05-21 ENCOUNTER — Encounter: Payer: Self-pay | Admitting: Podiatry

## 2015-05-21 ENCOUNTER — Ambulatory Visit (INDEPENDENT_AMBULATORY_CARE_PROVIDER_SITE_OTHER): Payer: Commercial Managed Care - PPO

## 2015-05-21 VITALS — BP 121/82 | HR 66 | Resp 16

## 2015-05-21 DIAGNOSIS — D3610 Benign neoplasm of peripheral nerves and autonomic nervous system, unspecified: Secondary | ICD-10-CM

## 2015-05-21 DIAGNOSIS — G5791 Unspecified mononeuropathy of right lower limb: Secondary | ICD-10-CM

## 2015-05-21 NOTE — Progress Notes (Signed)
   Subjective:    Patient ID: Gina Farrell, female    DOB: Sep 07, 1971, 44 y.o.   MRN: [id number]  HPI Comments: "I have a toenail with a fungus"  Patient c/o tender, discolored 1st toenail right for about 4-5 weeks. She went and had a pedicure and noticed this starting few weeks later. She also has peeling skin plantarly that has her concerned. She has tried OTC antifungal meds and PCP Rx'd ciclopirox cream-no help.  Also, concerned with some numbness on the 1st and 2nd toes and interdigital right for a couple of weeks and not sure if this is related to the toenail problem.     Review of Systems  Skin:       Change in nails  Neurological: Positive for numbness.  All other systems reviewed and are negative.      Objective:   Physical Exam: I have reviewed her past medical history medications allergies surgery social history and review of systems. Pulses are strongly palpable bilateral. Neurologic sensorium is intact with Semmes-Weinstein monofilament with exception of the deep peroneal nerve as it courses from the dorsal aspect of the foot clean the first and second metatarsals. From that point distally she has numbness which is truly an anesthesia. Muscle strength is 5 over 5 dorsiflexion plantar flexors and inverters everters on his musculature is intact. Orthopedic evaluation demonstrates old who assistant and ankle range of motion without crepitation.        Assessment & Plan:  Assessment: Neuritis deep peroneal nerve right foot.  Plan: Injected dexamethasone and local and aesthetic deep peroneal nerve as it courses between the proximal first and second metatarsal. She does not resolve her symptoms we will consider neurology for an EMG and a nerve conduction velocity exam.

## 2015-06-18 ENCOUNTER — Ambulatory Visit: Payer: Commercial Managed Care - PPO | Admitting: Podiatry

## 2015-06-30 ENCOUNTER — Ambulatory Visit: Payer: Commercial Managed Care - PPO | Admitting: Podiatry

## 2015-07-02 ENCOUNTER — Ambulatory Visit (INDEPENDENT_AMBULATORY_CARE_PROVIDER_SITE_OTHER): Payer: Commercial Managed Care - PPO | Admitting: Podiatry

## 2015-07-02 ENCOUNTER — Telehealth: Payer: Self-pay | Admitting: *Deleted

## 2015-07-02 VITALS — BP 117/75 | HR 95 | Resp 16

## 2015-07-02 DIAGNOSIS — R200 Anesthesia of skin: Secondary | ICD-10-CM

## 2015-07-02 DIAGNOSIS — G5791 Unspecified mononeuropathy of right lower limb: Secondary | ICD-10-CM | POA: Diagnosis not present

## 2015-07-02 NOTE — Progress Notes (Signed)
Gina Farrell presents today for follow-up of her deep peroneal nerve neuritis right foot. The last time she was in we injected it and ran her on a short dosage Medrol. She relates today that she has experienced some sciatic issues as well as pain around the lateral aspect of the right knee she states that she also has crepitation with knee and has a history of knee problems.  Objective: Vital signs are stable she is alert and oriented 3 he is still a sensation between the first and second toes of the right foot.  Assessment probable deep peroneal nerve entrapment right foot.  Plan: Cannot rule out higher entrapment including radiculopathy. We are requesting an nerve conduction velocity exam and EMG. I will follow up with her once those results come in.

## 2015-07-02 NOTE — Telephone Encounter (Signed)
Faxed Dr. Stephenie Acres orders for NCV and EMG and referral for neurologic consult for right deep peroneal entrapment, [phone].

## 2015-08-05 ENCOUNTER — Ambulatory Visit: Payer: Commercial Managed Care - PPO | Admitting: Neurology

## 2015-08-22 ENCOUNTER — Other Ambulatory Visit: Payer: Self-pay | Admitting: Internal Medicine

## 2015-08-25 ENCOUNTER — Ambulatory Visit: Payer: Commercial Managed Care - PPO | Admitting: Neurology

## 2015-10-14 ENCOUNTER — Other Ambulatory Visit: Payer: Self-pay

## 2015-10-14 MED ORDER — OLMESARTAN MEDOXOMIL-HCTZ 40-25 MG PO TABS: 1.0000 | ORAL_TABLET | Freq: Every day | ORAL | Status: DC

## 2016-01-19 ENCOUNTER — Ambulatory Visit (INDEPENDENT_AMBULATORY_CARE_PROVIDER_SITE_OTHER): Payer: Commercial Managed Care - PPO | Admitting: Internal Medicine

## 2016-01-19 ENCOUNTER — Encounter: Payer: Self-pay | Admitting: Internal Medicine

## 2016-01-19 VITALS — BP 122/76 | HR 73 | Temp 98.2°F | Ht 64.0 in | Wt 255.0 lb

## 2016-01-19 DIAGNOSIS — Z91048 Other nonmedicinal substance allergy status: Secondary | ICD-10-CM

## 2016-01-19 DIAGNOSIS — E785 Hyperlipidemia, unspecified: Secondary | ICD-10-CM | POA: Diagnosis not present

## 2016-01-19 DIAGNOSIS — I10 Essential (primary) hypertension: Secondary | ICD-10-CM

## 2016-01-19 DIAGNOSIS — K219 Gastro-esophageal reflux disease without esophagitis: Secondary | ICD-10-CM

## 2016-01-19 DIAGNOSIS — Z9109 Other allergy status, other than to drugs and biological substances: Secondary | ICD-10-CM

## 2016-01-19 DIAGNOSIS — Z0000 Encounter for general adult medical examination without abnormal findings: Secondary | ICD-10-CM

## 2016-01-19 LAB — LIPID PANEL
Cholesterol: 179 mg/dL (ref 0–200)
HDL: 43.1 mg/dL (ref >39.00)
LDL CALC: 116 mg/dL — AB (ref 0–99)
NONHDL: 135.53
Total CHOL/HDL Ratio: 4
Triglycerides: 96 mg/dL (ref 0.0–149.0)
VLDL: 19.2 mg/dL (ref 0.0–40.0)

## 2016-01-19 LAB — CBC
HEMATOCRIT: 38 % (ref 36.0–46.0)
Hemoglobin: 13 g/dL (ref 12.0–15.0)
MCHC: 34.3 g/dL (ref 30.0–36.0)
MCV: 80.9 fl (ref 78.0–100.0)
Platelets: 249 10*3/uL (ref 150.0–400.0)
RBC: 4.7 Mil/uL (ref 3.87–5.11)
RDW: 13.5 % (ref 11.5–15.5)
WBC: 6.5 10*3/uL (ref 4.0–10.5)

## 2016-01-19 LAB — COMPREHENSIVE METABOLIC PANEL
ALT: 16 U/L (ref 0–35)
AST: 17 U/L (ref 0–37)
Albumin: 4 g/dL (ref 3.5–5.2)
Alkaline Phosphatase: 53 U/L (ref 39–117)
BUN: 15 mg/dL (ref 6–23)
CHLORIDE: 105 meq/L (ref 96–112)
CO2: 29 mEq/L (ref 19–32)
Calcium: 9.6 mg/dL (ref 8.4–10.5)
Creatinine, Ser: 0.7 mg/dL (ref 0.40–1.20)
GFR: 96.58 mL/min (ref >60.00)
GLUCOSE: 98 mg/dL (ref 70–99)
POTASSIUM: 3.5 meq/L (ref 3.5–5.1)
SODIUM: 139 meq/L (ref 135–145)
Total Bilirubin: 0.3 mg/dL (ref 0.2–1.2)
Total Protein: 7 g/dL (ref 6.0–8.3)

## 2016-01-19 LAB — HEMOGLOBIN A1C: HEMOGLOBIN A1C: 5.3 % (ref 4.6–6.5)

## 2016-01-19 NOTE — Patient Instructions (Signed)
Health Maintenance, Female Adopting a healthy lifestyle and getting preventive care can go a long way to promote health and wellness. Talk with your health care provider about what schedule of regular examinations is right for you. This is a good chance for you to check in with your provider about disease prevention and staying healthy. In between checkups, there are plenty of things you can do on your own. Experts have done a lot of research about which lifestyle changes and preventive measures are most likely to keep you healthy. Ask your health care provider for more information. WEIGHT AND DIET  Eat a healthy diet  Be sure to include plenty of vegetables, fruits, low-fat dairy products, and lean protein.  Do not eat a lot of foods high in solid fats, added sugars, or salt.  Get regular exercise. This is one of the most important things you can do for your health.  Most adults should exercise for at least 150 minutes each week. The exercise should increase your heart rate and make you sweat (moderate-intensity exercise).  Most adults should also do strengthening exercises at least twice a week. This is in addition to the moderate-intensity exercise.  Maintain a healthy weight  Body mass index (BMI) is a measurement that can be used to identify possible weight problems. It estimates body fat based on height and weight. Your health care provider can help determine your BMI and help you achieve or maintain a healthy weight.  For females 20 years of age and older:   A BMI below 18.5 is considered underweight.  A BMI of 18.5 to 24.9 is normal.  A BMI of 25 to 29.9 is considered overweight.  A BMI of 30 and above is considered obese.  Watch levels of cholesterol and blood lipids  You should start having your blood tested for lipids and cholesterol at 44 years of age, then have this test every 5 years.  You may need to have your cholesterol levels checked more often if:  Your lipid  or cholesterol levels are high.  You are older than 44 years of age.  You are at high risk for heart disease.  CANCER SCREENING   Lung Cancer  Lung cancer screening is recommended for adults 55-80 years old who are at high risk for lung cancer because of a history of smoking.  A yearly low-dose CT scan of the lungs is recommended for people who:  Currently smoke.  Have quit within the past 15 years.  Have at least a 30-pack-year history of smoking. A pack year is smoking an average of one pack of cigarettes a day for 1 year.  Yearly screening should continue until it has been 15 years since you quit.  Yearly screening should stop if you develop a health problem that would prevent you from having lung cancer treatment.  Breast Cancer  Practice breast self-awareness. This means understanding how your breasts normally appear and feel.  It also means doing regular breast self-exams. Let your health care provider know about any changes, no matter how small.  If you are in your 20s or 30s, you should have a clinical breast exam (CBE) by a health care provider every 1-3 years as part of a regular health exam.  If you are 40 or older, have a CBE every year. Also consider having a breast X-ray (mammogram) every year.  If you have a family history of breast cancer, talk to your health care provider about genetic screening.  If you   are at high risk for breast cancer, talk to your health care provider about having an MRI and a mammogram every year.  Breast cancer gene (BRCA) assessment is recommended for women who have family members with BRCA-related cancers. BRCA-related cancers include:  Breast.  Ovarian.  Tubal.  Peritoneal cancers.  Results of the assessment will determine the need for genetic counseling and BRCA1 and BRCA2 testing. Cervical Cancer Your health care provider may recommend that you be screened regularly for cancer of the pelvic organs (ovaries, uterus, and  vagina). This screening involves a pelvic examination, including checking for microscopic changes to the surface of your cervix (Pap test). You may be encouraged to have this screening done every 3 years, beginning at age 21.  For women ages 30-65, health care providers may recommend pelvic exams and Pap testing every 3 years, or they may recommend the Pap and pelvic exam, combined with testing for human papilloma virus (HPV), every 5 years. Some types of HPV increase your risk of cervical cancer. Testing for HPV may also be done on women of any age with unclear Pap test results.  Other health care providers may not recommend any screening for nonpregnant women who are considered low risk for pelvic cancer and who do not have symptoms. Ask your health care provider if a screening pelvic exam is right for you.  If you have had past treatment for cervical cancer or a condition that could lead to cancer, you need Pap tests and screening for cancer for at least 20 years after your treatment. If Pap tests have been discontinued, your risk factors (such as having a new sexual partner) need to be reassessed to determine if screening should resume. Some women have medical problems that increase the chance of getting cervical cancer. In these cases, your health care provider may recommend more frequent screening and Pap tests. Colorectal Cancer  This type of cancer can be detected and often prevented.  Routine colorectal cancer screening usually begins at 44 years of age and continues through 44 years of age.  Your health care provider may recommend screening at an earlier age if you have risk factors for colon cancer.  Your health care provider may also recommend using home test kits to check for hidden blood in the stool.  A small camera at the end of a tube can be used to examine your colon directly (sigmoidoscopy or colonoscopy). This is done to check for the earliest forms of colorectal  cancer.  Routine screening usually begins at age 50.  Direct examination of the colon should be repeated every 5-10 years through 44 years of age. However, you may need to be screened more often if early forms of precancerous polyps or small growths are found. Skin Cancer  Check your skin from head to toe regularly.  Tell your health care provider about any new moles or changes in moles, especially if there is a change in a mole's shape or color.  Also tell your health care provider if you have a mole that is larger than the size of a pencil eraser.  Always use sunscreen. Apply sunscreen liberally and repeatedly throughout the day.  Protect yourself by wearing long sleeves, pants, a wide-brimmed hat, and sunglasses whenever you are outside. HEART DISEASE, DIABETES, AND HIGH BLOOD PRESSURE   High blood pressure causes heart disease and increases the risk of stroke. High blood pressure is more likely to develop in:  People who have blood pressure in the high end   of the normal range (130-139/85-89 mm Hg).  People who are overweight or obese.  People who are African American.  If you are 38-23 years of age, have your blood pressure checked every 3-5 years. If you are 61 years of age or older, have your blood pressure checked every year. You should have your blood pressure measured twice--once when you are at a hospital or clinic, and once when you are not at a hospital or clinic. Record the average of the two measurements. To check your blood pressure when you are not at a hospital or clinic, you can use:  An automated blood pressure machine at a pharmacy.  A home blood pressure monitor.  If you are between 45 years and 39 years old, ask your health care provider if you should take aspirin to prevent strokes.  Have regular diabetes screenings. This involves taking a blood sample to check your fasting blood sugar level.  If you are at a normal weight and have a low risk for diabetes,  have this test once every three years after 44 years of age.  If you are overweight and have a high risk for diabetes, consider being tested at a younger age or more often. PREVENTING INFECTION  Hepatitis B  If you have a higher risk for hepatitis B, you should be screened for this virus. You are considered at high risk for hepatitis B if:  You were born in a country where hepatitis B is common. Ask your health care provider which countries are considered high risk.  Your parents were born in a high-risk country, and you have not been immunized against hepatitis B (hepatitis B vaccine).  You have HIV or AIDS.  You use needles to inject street drugs.  You live with someone who has hepatitis B.  You have had sex with someone who has hepatitis B.  You get hemodialysis treatment.  You take certain medicines for conditions, including cancer, organ transplantation, and autoimmune conditions. Hepatitis C  Blood testing is recommended for:  Everyone born from [birth year] through 1964.  Anyone with known risk factors for hepatitis C. Sexually transmitted infections (STIs)  You should be screened for sexually transmitted infections (STIs) including gonorrhea and chlamydia if:  You are sexually active and are younger than 44 years of age.  You are older than 44 years of age and your health care provider tells you that you are at risk for this type of infection.  Your sexual activity has changed since you were last screened and you are at an increased risk for chlamydia or gonorrhea. Ask your health care provider if you are at risk.  If you do not have HIV, but are at risk, it may be recommended that you take a prescription medicine daily to prevent HIV infection. This is called pre-exposure prophylaxis (PrEP). You are considered at risk if:  You are sexually active and do not regularly use condoms or know the HIV status of your partner(s).  You take drugs by injection.  You are sexually  active with a partner who has HIV. Talk with your health care provider about whether you are at high risk of being infected with HIV. If you choose to begin PrEP, you should first be tested for HIV. You should then be tested every 3 months for as long as you are taking PrEP.  PREGNANCY   If you are premenopausal and you may become pregnant, ask your health care provider about preconception counseling.  If you may  become pregnant, take 400 to 800 micrograms (mcg) of folic acid every day.  If you want to prevent pregnancy, talk to your health care provider about birth control (contraception). OSTEOPOROSIS AND MENOPAUSE   Osteoporosis is a disease in which the bones lose minerals and strength with aging. This can result in serious bone fractures. Your risk for osteoporosis can be identified using a bone density scan.  If you are 61 years of age or older, or if you are at risk for osteoporosis and fractures, ask your health care provider if you should be screened.  Ask your health care provider whether you should take a calcium or vitamin D supplement to lower your risk for osteoporosis.  Menopause may have certain physical symptoms and risks.  Hormone replacement therapy may reduce some of these symptoms and risks. Talk to your health care provider about whether hormone replacement therapy is right for you.  HOME CARE INSTRUCTIONS   Schedule regular health, dental, and eye exams.  Stay current with your immunizations.   Do not use any tobacco products including cigarettes, chewing tobacco, or electronic cigarettes.  If you are pregnant, do not drink alcohol.  If you are breastfeeding, limit how much and how often you drink alcohol.  Limit alcohol intake to no more than 1 drink per day for nonpregnant women. One drink equals 12 ounces of beer, 5 ounces of wine, or 1 ounces of hard liquor.  Do not use street drugs.  Do not share needles.  Ask your health care provider for help if  you need support or information about quitting drugs.  Tell your health care provider if you often feel depressed.  Tell your health care provider if you have ever been abused or do not feel safe at home.   This information is not intended to replace advice given to you by your health care provider. Make sure you discuss any questions you have with your health care provider.   Document Released: 05/30/2011 Document Revised: 12/05/2014 Document Reviewed: 10/16/2013 Elsevier Interactive Patient Education [employer].

## 2016-01-19 NOTE — Progress Notes (Signed)
Pre visit review using our clinic review tool, if applicable. No additional management support is needed unless otherwise documented below in the visit note. 

## 2016-01-19 NOTE — Assessment & Plan Note (Signed)
Avoid triggers Discussed how weight loss could help improve her reflux Continue Nexium May need to consider EGD with colonoscopy screening

## 2016-01-19 NOTE — Assessment & Plan Note (Signed)
Avoid triggers Advised her to continue Singulair daily and Flonase as needed

## 2016-01-19 NOTE — Assessment & Plan Note (Signed)
Diet controlled Will check CMET and Lipid Profile today Encouraged her to consume a low fat diet Continue daily Fish Oil

## 2016-01-19 NOTE — Progress Notes (Signed)
Subjective:    Patient ID: Gina Farrell, female    DOB: 1971/01/06, 45 y.o.   MRN: [id number]  HPI  Pt presents to the clinic today for her annual exam. She is also due for follow of chronic conditions, see separate note.  Flu: 08/2015 Tetanus: 2012 Pap Smear: 08/2014 Mammogram: 08/2014 Vision Screening: 12/26/14 Dentist: [email or web address]  Diet: She does eat meat. She consumes fruits and veggies every day. She tries to avoid fried foods. She drinks mostly water. Exercise: She is walking for an hour every day.  Review of Systems      Past Medical History  Diagnosis Date  . Hypertension   . Allergic rhinitis   . Vitamin D deficiency   . Miscarriage 1998/2008  . Chicken pox   . GERD (gastroesophageal reflux disease)     Current Outpatient Prescriptions  Medication Sig Dispense Refill  . aspirin 81 MG tablet Take 81 mg by mouth daily.    . B Complex-C (SUPER B COMPLEX PO) Take by mouth daily.    . ciclopirox (LOPROX) 0.77 % cream Apply topically 2 (two) times daily. 15 g 0  . esomeprazole (NEXIUM) 20 MG packet Take 20 mg by mouth daily before breakfast.    . famciclovir (FAMVIR) 500 MG tablet Take 1 tablet (500 mg total) by mouth 3 (three) times daily. 21 tablet 1  . fluticasone (FLONASE) 50 MCG/ACT nasal spray Place into both nostrils as needed.     . magnesium oxide (MAG-OX) 400 MG tablet 400 mg every other day.    . montelukast (SINGULAIR) 10 MG tablet TAKE 1 TABLET BY MOUTH AT BEDTIME. 30 tablet 5  . Multiple Vitamins-Minerals (MULTIVITAMIN PO) Take by mouth daily.    Marland Kitchen olmesartan-hydrochlorothiazide (BENICAR HCT) 40-25 MG tablet Take 1 tablet by mouth daily. 90 tablet 0  . Omega-3 Fatty Acids (FISH OIL) 1000 MG CAPS Take 100 mg by mouth daily.     No current facility-administered medications for this visit.    Allergies  Allergen Reactions  . Red Yeast Rice [Cholestin]     Cramps/Heartburn    Family History  Problem Relation Age of Onset  . Hypertension Mother     . Stroke Mother   . Atrial fibrillation Mother   . Diabetes Mother   . Rheum arthritis Maternal Grandmother   . Coronary artery disease Maternal Grandmother   . Hypertension Maternal Grandmother   . Cancer Neg Hx     Social History   Social History  . Marital Status: Married    Spouse Name: N/A  . Number of Children: N/A  . Years of Education: N/A   Occupational History  . Not on file.   Social History Main Topics  . Smoking status: Former Smoker    Quit date: 11/28/2001  . Smokeless tobacco: Never Used  . Alcohol Use: 0.0 oz/week    0 Standard drinks or equivalent per week     Comment: rare  . Drug Use: No  . Sexual Activity: Yes   Other Topics Concern  . Not on file   Social History Narrative     Constitutional: Denies fever, malaise, fatigue, headache or abrupt weight changes.  HEENT: Denies eye pain, eye redness, ear pain, ringing in the ears, wax buildup, runny nose, nasal congestion, bloody nose, or sore throat. Respiratory: Denies difficulty breathing, shortness of breath, cough or sputum production.   Cardiovascular: Denies chest pain, chest tightness, palpitations or swelling in the hands or feet.  Gastrointestinal: Denies abdominal pain,  bloating, constipation, diarrhea or blood in the stool.  GU: Denies urgency, frequency, pain with urination, burning sensation, blood in urine, odor or discharge. Musculoskeletal: Denies decrease in range of motion, difficulty with gait, muscle pain or joint pain and swelling.  Skin: Denies redness, rashes, lesions or ulcercations.  Neurological: Denies dizziness, difficulty with memory, difficulty with speech or problems with balance and coordination.  Psych: Denies anxiety, depression, SI/HI.  No other specific complaints in a complete review of systems (except as listed in HPI above).  Objective:   Physical Exam  BP 122/76 mmHg  Pulse 73  Temp(Src) 98.2 F (36.8 C) (Oral)  Ht _0  (1.626 m)  Wt 255 lb (115.667  kg)  BMI 43.75 kg/m2  SpO2 98%  LMP 01/15/2016 Wt Readings from Last 3 Encounters:  01/19/16 255 lb (115.667 kg)  01/13/15 254 lb (115.214 kg)  01/29/14 236 lb (107.049 kg)    General: Appears her stated age, obese in NAD. Skin: Warm, dry and intact. Skin tags noted on neck. HEENT: Head: normal shape and size; Eyes: sclera white, no icterus, conjunctiva pink, PERRLA and EOMs intact; Ears: Tm's gray and intact, normal light reflex; Throat/Mouth: Teeth present, mucosa pink and moist, no exudate, lesions or ulcerations noted.  Neck:  Neck supple, trachea midline. No masses, lumps or thyromegaly present. Acanthos Nigricans noted on posterior neck. Cardiovascular: Normal rate and rhythm. Diminished S1,S2 noted.  No murmur, rubs or gallops noted. No JVD or BLE edema. No carotid bruits noted. Pulmonary/Chest: Normal effort and positive vesicular breath sounds. No respiratory distress. No wheezes, rales or ronchi noted.  Abdomen: Soft and nontender. Normal bowel sounds. No distention or masses noted. Liver, spleen and kidneys non palpable. Musculoskeletal: Strength 5/5 UE/BLE. No signs of joint swelling. No difficulty with gait.  Neurological: Alert and oriented. Cranial nerves II-XII grossly intact. Coordination normal.  Psychiatric: Mood and affect normal. Behavior is normal. Judgment and thought content normal.     BMET    Component Value Date/Time   NA 137 01/13/2015 1043   K 3.7 01/13/2015 1043   CL 103 01/13/2015 1043   CO2 30 01/13/2015 1043   GLUCOSE 89 01/13/2015 1043   BUN 17 01/13/2015 1043   CREATININE 0.68 01/13/2015 1043   CREATININE 0.62 01/29/2014 1053   CALCIUM 9.9 01/13/2015 1043   GFRNONAA >89 01/29/2014 1053   GFRNONAA >60 10/30/2008 1049   GFRAA >89 01/29/2014 1053   GFRAA  10/30/2008 1049    >60        The eGFR has been calculated using the MDRD equation. This calculation has not been validated in all clinical    Lipid Panel     Component Value Date/Time     CHOL 183 01/13/2015 1043   TRIG 86.0 01/13/2015 1043   HDL 46.30 01/13/2015 1043   CHOLHDL 4 01/13/2015 1043   VLDL 17.2 01/13/2015 1043   LDLCALC 120* 01/13/2015 1043    CBC    Component Value Date/Time   WBC 7.7 01/13/2015 1043   WBC 8.0 01/14/2012 1139   RBC 4.82 01/13/2015 1043   RBC 4.93 01/14/2012 1139   HGB 13.1 01/13/2015 1043   HGB 12.9 01/14/2012 1139   HCT 39.0 01/13/2015 1043   HCT 40.5 01/14/2012 1139   PLT 263.0 01/13/2015 1043   MCV 80.8 01/13/2015 1043   MCV 82.1 01/14/2012 1139   MCH 27.3 01/29/2014 1053   MCH 62.2* 01/14/2012 1139   MCHC 33.7 01/13/2015 1043   MCHC 31.9 01/14/2012 1139  RDW 13.8 01/13/2015 1043   LYMPHSABS 1.4 01/29/2014 1053   MONOABS 0.7 01/29/2014 1053   EOSABS 0.1 01/29/2014 1053   BASOSABS 0.0 01/29/2014 1053    Hgb A1C Lab Results  Component Value Date   HGBA1C 5.4 01/13/2015         Assessment & Plan:   Preventative Health Maintenance:  Flu and Tetanus UTD Mammogram and Pap Smear UTD Encouraged her to continue to see an eye doctor and dentist at least annually Encouraged her to consume a healthy diet and start an exercise regimen Will check CBC, CMET, Lipid, A1C and HIV today  RTC in 6 months or sooner if needed   HPI:  Pt presents to the clinic today for follow up of chronic conditions.  HTN: BP well controlled on Benicar-HCT. BP today 122/76. ECG from 02/2012 reviewed.  Allergies: Taking Singulair daily and Flonase as needed. Denies breakthrough symptoms.  GERD: She reports she has heartburn 2-3 times per month. Her reflux is triggered by [place], spicy food, tomato based products. She takes Prilosec or Nexium OTC as needed.  HLD: Last lipid profile 12/2014. LDL was 120. She does try to consume a low fat diet. She takes Fish Oil daily.  Obesity: BMI of 43.77. Does not adhere to any particular diet. She has started walking for 1 hour every day.  Gets Herpes lesions (HSV 1) on her nose. Occurs every 3  months but has not had in the last year. Typically occurred with a sinus infection or cold. She takes Famvir when it occurs.   Review of Systems:   Past Medical History  Diagnosis Date  . Hypertension   . Allergic rhinitis   . Vitamin D deficiency   . Miscarriage 1998/2008  . Chicken pox   . GERD (gastroesophageal reflux disease)     Current Outpatient Prescriptions  Medication Sig Dispense Refill  . aspirin 81 MG tablet Take 81 mg by mouth daily.    . B Complex-C (SUPER B COMPLEX PO) Take by mouth daily.    . ciclopirox (LOPROX) 0.77 % cream Apply topically 2 (two) times daily. 15 g 0  . esomeprazole (NEXIUM) 20 MG packet Take 20 mg by mouth daily before breakfast.    . famciclovir (FAMVIR) 500 MG tablet Take 1 tablet (500 mg total) by mouth 3 (three) times daily. 21 tablet 1  . fluticasone (FLONASE) 50 MCG/ACT nasal spray Place into both nostrils as needed.     . magnesium oxide (MAG-OX) 400 MG tablet 400 mg every other day.    . montelukast (SINGULAIR) 10 MG tablet TAKE 1 TABLET BY MOUTH AT BEDTIME. 30 tablet 5  . Multiple Vitamins-Minerals (MULTIVITAMIN PO) Take by mouth daily.    Marland Kitchen olmesartan-hydrochlorothiazide (BENICAR HCT) 40-25 MG tablet Take 1 tablet by mouth daily. 90 tablet 0  . Omega-3 Fatty Acids (FISH OIL) 1000 MG CAPS Take 100 mg by mouth daily.     No current facility-administered medications for this visit.    Allergies  Allergen Reactions  . Red Yeast Rice [Cholestin]     Cramps/Heartburn    Family History  Problem Relation Age of Onset  . Hypertension Mother   . Stroke Mother   . Atrial fibrillation Mother   . Diabetes Mother   . Rheum arthritis Maternal Grandmother   . Coronary artery disease Maternal Grandmother   . Hypertension Maternal Grandmother   . Cancer Neg Hx     Social History   Social History  . Marital Status: Married  Spouse Name: N/A  . Number of Children: N/A  . Years of Education: N/A   Occupational History  . Not on  file.   Social History Main Topics  . Smoking status: Former Smoker    Quit date: 11/28/2001  . Smokeless tobacco: Never Used  . Alcohol Use: 0.0 oz/week    0 Standard drinks or equivalent per week     Comment: rare  . Drug Use: No  . Sexual Activity: Yes   Other Topics Concern  . Not on file   Social History Narrative     Constitutional: Denies fever, malaise, fatigue, headache or abrupt weight changes.  HEENT: Denies eye pain, eye redness, ear pain, ringing in the ears, wax buildup, runny nose, nasal congestion, bloody nose, or sore throat. Respiratory: Denies difficulty breathing, shortness of breath, cough or sputum production.   Cardiovascular: Denies chest pain, chest tightness, palpitations or swelling in the hands or feet.  Gastrointestinal: Denies abdominal pain, bloating, constipation, diarrhea or blood in the stool.  GU: Denies urgency, frequency, pain with urination, burning sensation, blood in urine, odor or discharge. Musculoskeletal: Denies decrease in range of motion, difficulty with gait, muscle pain or joint pain and swelling.  Skin: Denies redness, rashes, lesions or ulcercations.  Neurological: Denies dizziness, difficulty with memory, difficulty with speech or problems with balance and coordination.  Psych: Denies anxiety, depression, SI/HI.  No other specific complaints in a complete review of systems (except as listed in HPI above).  Objective:  BP 122/76 mmHg  Pulse 73  Temp(Src) 98.2 F (36.8 C) (Oral)  Ht _0  (1.626 m)  Wt 255 lb (115.667 kg)  BMI 43.75 kg/m2  SpO2 98%  LMP 01/15/2016 Wt Readings from Last 3 Encounters:  01/19/16 255 lb (115.667 kg)  01/13/15 254 lb (115.214 kg)  01/29/14 236 lb (107.049 kg)    General: Appears her stated age, obese in NAD. HEENT: Head: normal shape and size; Eyes: sclera white, no icterus, conjunctiva pink, PERRLA and EOMs intact; Ears: Tm's gray and intact, normal light reflex; Nose: mucosa pink and  moist, septum midline; Throat/Mouth: Teeth present, mucosa pink and moist, no exudate, lesions or ulcerations noted.  Cardiovascular: Normal rate and rhythm. S1,S2 noted.  No murmur, rubs or gallops noted. No JVD or BLE edema. No carotid bruits noted. Pulmonary/Chest: Normal effort and positive vesicular breath sounds. No respiratory distress. No wheezes, rales or ronchi noted.  Abdomen: Soft and nontender. Normal bowel sounds. No distention or masses noted. Liver, spleen and kidneys non palpable. Neurological: Alert and oriented.    BMET    Component Value Date/Time   NA 137 01/13/2015 1043   K 3.7 01/13/2015 1043   CL 103 01/13/2015 1043   CO2 30 01/13/2015 1043   GLUCOSE 89 01/13/2015 1043   BUN 17 01/13/2015 1043   CREATININE 0.68 01/13/2015 1043   CREATININE 0.62 01/29/2014 1053   CALCIUM 9.9 01/13/2015 1043   GFRNONAA >89 01/29/2014 1053   GFRNONAA >60 10/30/2008 1049   GFRAA >89 01/29/2014 1053   GFRAA  10/30/2008 1049    >60        The eGFR has been calculated using the MDRD equation. This calculation has not been validated in all clinical    Lipid Panel     Component Value Date/Time   CHOL 183 01/13/2015 1043   TRIG 86.0 01/13/2015 1043   HDL 46.30 01/13/2015 1043   CHOLHDL 4 01/13/2015 1043   VLDL 17.2 01/13/2015 1043   LDLCALC 120*  01/13/2015 1043    CBC    Component Value Date/Time   WBC 7.7 01/13/2015 1043   WBC 8.0 01/14/2012 1139   RBC 4.82 01/13/2015 1043   RBC 4.93 01/14/2012 1139   HGB 13.1 01/13/2015 1043   HGB 12.9 01/14/2012 1139   HCT 39.0 01/13/2015 1043   HCT 40.5 01/14/2012 1139   PLT 263.0 01/13/2015 1043   MCV 80.8 01/13/2015 1043   MCV 82.1 01/14/2012 1139   MCH 27.3 01/29/2014 1053   MCH 62.2* 01/14/2012 1139   MCHC 33.7 01/13/2015 1043   MCHC 31.9 01/14/2012 1139   RDW 13.8 01/13/2015 1043   LYMPHSABS 1.4 01/29/2014 1053   MONOABS 0.7 01/29/2014 1053   EOSABS 0.1 01/29/2014 1053   BASOSABS 0.0 01/29/2014 1053    Hgb  A1C Lab Results  Component Value Date   HGBA1C 5.4 01/13/2015    Assessment and Plan:

## 2016-01-19 NOTE — Assessment & Plan Note (Signed)
Advised her to consume a 1500 calorie restricted diet and start an exercise regimen.

## 2016-01-19 NOTE — Assessment & Plan Note (Signed)
Well controlled on Benicar HCT CMET today Consume a low salt diet and exercise to lose weight

## 2016-01-20 LAB — HIV ANTIBODY (ROUTINE TESTING W REFLEX): HIV 1&2 Ab, 4th Generation: NONREACTIVE

## 2016-01-26 ENCOUNTER — Encounter: Payer: Self-pay | Admitting: Internal Medicine

## 2016-01-26 ENCOUNTER — Ambulatory Visit (INDEPENDENT_AMBULATORY_CARE_PROVIDER_SITE_OTHER): Payer: Commercial Managed Care - PPO | Admitting: Internal Medicine

## 2016-01-26 VITALS — BP 136/84 | HR 101 | Temp 98.5°F | Wt 254.0 lb

## 2016-01-26 DIAGNOSIS — J0100 Acute maxillary sinusitis, unspecified: Secondary | ICD-10-CM

## 2016-01-26 MED ORDER — AMOXICILLIN-POT CLAVULANATE 875-125 MG PO TABS: 1.0000 | ORAL_TABLET | Freq: Two times a day (BID) | ORAL | Status: DC

## 2016-01-26 NOTE — Progress Notes (Signed)
HPI  Pt presents to the clinic today with c/o headache, facial pain and pressure, ear fullness and nasal congestion. This started 1 week ago. She is blowing clear/yellow mucous out of her nose. She denies cough, chest congestion or shortness of breath. She denies fever, but has had chills and body aches. She has tried Mucinex, Tylenol cold and flu, Flonase and Afrin without any relief. She does have a history of seasonal allergies. She has not had sick contacts.   Review of Systems    Past Medical History  Diagnosis Date  . Hypertension   . Allergic rhinitis   . Vitamin D deficiency   . Miscarriage 1998/2008  . Chicken pox   . GERD (gastroesophageal reflux disease)     Family History  Problem Relation Age of Onset  . Hypertension Mother   . Stroke Mother   . Atrial fibrillation Mother   . Diabetes Mother   . Rheum arthritis Maternal Grandmother   . Coronary artery disease Maternal Grandmother   . Hypertension Maternal Grandmother   . Cancer Neg Hx     Social History   Social History  . Marital Status: Married    Spouse Name: N/A  . Number of Children: N/A  . Years of Education: N/A   Occupational History  . Not on file.   Social History Main Topics  . Smoking status: Former Smoker    Quit date: 11/28/2001  . Smokeless tobacco: Never Used  . Alcohol Use: 0.0 oz/week    0 Standard drinks or equivalent per week     Comment: rare  . Drug Use: No  . Sexual Activity: Yes   Other Topics Concern  . Not on file   Social History Narrative    Allergies  Allergen Reactions  . Red Yeast Rice [Cholestin]     Cramps/Heartburn     Constitutional: Positive headache, fatigue. Denies fever or abrupt weight changes.  HEENT:  Positive facial pain, nasal congestion. Denies eye redness, ear pain, ringing in the ears, wax buildup, runny nose or sore throat. Respiratory:  Denies cough, difficulty breathing or shortness of breath.  Cardiovascular: Denies chest pain, chest  tightness, palpitations or swelling in the hands or feet.   No other specific complaints in a complete review of systems (except as listed in HPI above).  Objective:  BP 136/84 mmHg  Pulse 101  Temp(Src) 98.5 F (36.9 C) (Oral)  Wt 254 lb (115.214 kg)  SpO2 99%  LMP 01/15/2016   General: Appears her stated age, ill appearing in NAD. HEENT: Head: normal shape and size, maxillary sinus tenderness noted; Eyes: sclera white, no icterus, conjunctiva pink; Ears: Tm's pink but intact, normal light reflex; Nose: mucosa boggy and moist, septum midline; Throat/Mouth: Teeth present, mucosa pink and moist, no exudate noted, no lesions or ulcerations noted.  Neck:  No adenopathy noted.  Cardiovascular: Normal rate and rhythm. S1,S2 noted.  No murmur, rubs or gallops noted.  Pulmonary/Chest: Normal effort and positive vesicular breath sounds. No respiratory distress. No wheezes, rales or ronchi noted.      Assessment & Plan:   Acute bacterial sinusitis  Can use a Neti Pot which can be purchased from your local drug store. Flonase 2 sprays each nostril for 3 days and then as needed. Augmentin BID for 10 days 80 mg Depo IM today  RTC as needed or if symptoms persist.

## 2016-01-26 NOTE — Progress Notes (Signed)
Pre visit review using our clinic review tool, if applicable. No additional management support is needed unless otherwise documented below in the visit note. 

## 2016-01-26 NOTE — Patient Instructions (Signed)

## 2016-01-27 DIAGNOSIS — J0100 Acute maxillary sinusitis, unspecified: Secondary | ICD-10-CM | POA: Diagnosis not present

## 2016-01-27 MED ORDER — METHYLPREDNISOLONE ACETATE 80 MG/ML IJ SUSP
80.0000 mg | Freq: Once | INTRAMUSCULAR | Status: AC
  Administered 2016-01-27: 80 mg via INTRAMUSCULAR

## 2016-01-27 NOTE — Addendum Note (Signed)
Addended by: Lurlean Nanny on: 01/27/2016 11:17 AM   Modules accepted: Orders

## 2016-02-19 ENCOUNTER — Ambulatory Visit (INDEPENDENT_AMBULATORY_CARE_PROVIDER_SITE_OTHER): Payer: Commercial Managed Care - PPO | Admitting: Family Medicine

## 2016-02-19 VITALS — BP 118/76 | HR 99 | Temp 98.8°F | Wt 255.0 lb

## 2016-02-19 DIAGNOSIS — H9202 Otalgia, left ear: Secondary | ICD-10-CM | POA: Diagnosis not present

## 2016-02-19 MED ORDER — AMOXICILLIN 500 MG PO CAPS: 500.0000 mg | ORAL_CAPSULE | Freq: Two times a day (BID) | ORAL | Status: DC

## 2016-02-19 NOTE — Patient Instructions (Signed)
Take the medication as prescribed.  Follow up as needed.  Take care  Dr. Lacinda Axon

## 2016-02-19 NOTE — Assessment & Plan Note (Signed)
New problem. Concern for infection given exam findings. Treating empirically with amoxicillin.

## 2016-02-19 NOTE — Progress Notes (Signed)
Pre visit review using our clinic review tool, if applicable. No additional management support is needed unless otherwise documented below in the visit note. 

## 2016-02-19 NOTE — Progress Notes (Signed)
   Subjective:  Patient ID: Gina Farrell, female    DOB: July 10, 1971  Age: 44 y.o. MRN: [id number]  CC: Ear pain  HPI:  45 year old female presents with the above complaint.  Ear pain - Left  Patient states that she has recently been exposed to influenza and is on prophylaxis.  She's had some respiratory symptoms but has been doing okay.  Yesterday she developed worsening ear pain.  She states that this worsened after a flight.  Ear pain is severe.  No exacerbating or relieving factors.  She's had a mildly elevated temperature.  Social Hx   Social History   Social History  . Marital Status: Married    Spouse Name: N/A  . Number of Children: N/A  . Years of Education: N/A   Social History Main Topics  . Smoking status: Former Smoker    Quit date: 11/28/2001  . Smokeless tobacco: Never Used  . Alcohol Use: 0.0 oz/week    0 Standard drinks or equivalent per week     Comment: rare  . Drug Use: No  . Sexual Activity: Yes   Other Topics Concern  . Not on file   Social History Narrative   Review of Systems  Constitutional: Negative for fever.  HENT: Positive for ear pain and sore throat.    Objective:  BP 118/76 mmHg  Pulse 99  Temp(Src) 98.8 F (37.1 C) (Oral)  Wt 255 lb (115.667 kg)  SpO2 98%  BP/Weight 02/19/2016 01/26/2016 [id number]  Systolic BP [id number] [id number]  Diastolic BP 76 84 76  Wt. (Lbs) 255 254 255  BMI 43.75 43.58 43.75   Physical Exam  Constitutional: She is oriented to person, place, and time. She appears well-developed. No distress.  HENT:  Head: Normocephalic and atraumatic.  Mouth/Throat: Oropharynx is clear and moist.  Left TM with effusion and mild erythema. ? Blood behind TM. No perforation noted.   Cardiovascular: Normal rate and regular rhythm.   Pulmonary/Chest: Effort normal and breath sounds normal.  Neurological: She is alert and oriented to person, place, and time.  Vitals reviewed.  Lab Results  Component Value Date   WBC  6.5 01/19/2016   HGB 13.0 01/19/2016   HCT 38.0 01/19/2016   PLT 249.0 01/19/2016   GLUCOSE 98 01/19/2016   CHOL 179 01/19/2016   TRIG 96.0 01/19/2016   HDL 43.10 01/19/2016   LDLCALC 116* 01/19/2016   ALT 16 01/19/2016   AST 17 01/19/2016   NA 139 01/19/2016   K 3.5 01/19/2016   CL 105 01/19/2016   CREATININE 0.70 01/19/2016   BUN 15 01/19/2016   CO2 29 01/19/2016   HGBA1C 5.3 01/19/2016    Assessment & Plan:   Problem List Items Addressed This Visit    Left ear pain - Primary    New problem. Concern for infection given exam findings. Treating empirically with amoxicillin.          Meds ordered this encounter  Medications  . amoxicillin (AMOXIL) 500 MG capsule    Sig: Take 1 capsule (500 mg total) by mouth 2 (two) times daily.    Dispense:  20 capsule    Refill:  0    Follow-up: PRN  Benedict

## 2016-03-03 ENCOUNTER — Encounter: Payer: Self-pay | Admitting: Internal Medicine

## 2016-03-03 ENCOUNTER — Ambulatory Visit (INDEPENDENT_AMBULATORY_CARE_PROVIDER_SITE_OTHER): Payer: Commercial Managed Care - PPO | Admitting: Internal Medicine

## 2016-03-03 VITALS — BP 118/82 | HR 87 | Temp 98.4°F | Wt 257.0 lb

## 2016-03-03 DIAGNOSIS — H6983 Other specified disorders of Eustachian tube, bilateral: Secondary | ICD-10-CM | POA: Diagnosis not present

## 2016-03-03 MED ORDER — PREDNISONE 10 MG PO TABS: ORAL_TABLET | ORAL | Status: DC

## 2016-03-03 NOTE — Progress Notes (Signed)
Subjective:    Patient ID: Gina Farrell, female    DOB: 06-15-1971, 45 y.o.   MRN: [id number]  HPI  Pt presents to the clinic today with c/o ongoing ear pain. She was seen on 02/19/2016 for bilateral ear infection, and given an 10 day course of Amoxil. She finished the abx as prescribed but reports she has  ear pressure radiating along her jaw, L>R. Her only other sxs inlcude chronic nasal congestion for which she uses Flonase daily.  Pt denies fever, HA, cough, or sore throat. She has not had sick contacts that she is aware of.  Review of Systems  Past Medical History  Diagnosis Date  . Hypertension   . Allergic rhinitis   . Vitamin D deficiency   . Miscarriage 1998/2008  . Chicken pox   . GERD (gastroesophageal reflux disease)     Current Outpatient Prescriptions  Medication Sig Dispense Refill  . aspirin 81 MG tablet Take 81 mg by mouth daily.    . B Complex-C (SUPER B COMPLEX PO) Take by mouth daily.    . ciclopirox (LOPROX) 0.77 % cream Apply topically 2 (two) times daily. 15 g 0  . esomeprazole (NEXIUM) 20 MG packet Take 20 mg by mouth daily before breakfast.    . famciclovir (FAMVIR) 500 MG tablet Take 1 tablet (500 mg total) by mouth 3 (three) times daily. 21 tablet 1  . fluticasone (FLONASE) 50 MCG/ACT nasal spray Place into both nostrils as needed.     . magnesium oxide (MAG-OX) 400 MG tablet 400 mg every other day.    . montelukast (SINGULAIR) 10 MG tablet TAKE 1 TABLET BY MOUTH AT BEDTIME. 30 tablet 5  . Multiple Vitamins-Minerals (MULTIVITAMIN PO) Take by mouth daily.    Marland Kitchen olmesartan-hydrochlorothiazide (BENICAR HCT) 40-25 MG tablet Take 1 tablet by mouth daily. 90 tablet 0  . Omega-3 Fatty Acids (FISH OIL) 1000 MG CAPS Take 100 mg by mouth daily.    . predniSONE (DELTASONE) 10 MG tablet Take 6 tabs day 1, 5 tabs day 2, 4 tabs day 3, 3 tabs day 4, 2 tabs day 5, 1 tab day 6 21 tablet 0   No current facility-administered medications for this visit.    Allergies    Allergen Reactions  . Red Yeast Rice [Cholestin]     Cramps/Heartburn    Family History  Problem Relation Age of Onset  . Hypertension Mother   . Stroke Mother   . Atrial fibrillation Mother   . Diabetes Mother   . Rheum arthritis Maternal Grandmother   . Coronary artery disease Maternal Grandmother   . Hypertension Maternal Grandmother   . Cancer Neg Hx     Social History   Social History  . Marital Status: Married    Spouse Name: N/A  . Number of Children: N/A  . Years of Education: N/A   Occupational History  . Not on file.   Social History Main Topics  . Smoking status: Former Smoker    Quit date: 11/28/2001  . Smokeless tobacco: Never Used  . Alcohol Use: 0.0 oz/week    0 Standard drinks or equivalent per week     Comment: rare  . Drug Use: No  . Sexual Activity: Yes   Other Topics Concern  . Not on file   Social History Narrative     Constitutional: Denies fever, malaise, fatigue, headache or abrupt weight changes.  HEENT: Pt reports ear pain/fullness and nasal congestion. Denies ringing in the ears,  wax buildup, runny nose, bloody nose, or sore throat.  Respiratory: Denies difficulty breathing, shortness of breath, or cough Cardiovascular: Denies chest pain, chest tightness, or palpitations   No other specific complaints in a complete review of systems (except as listed in HPI above).     Objective:   Physical Exam  BP 118/82 mmHg  Pulse 87  Temp(Src) 98.4 F (36.9 C) (Oral)  Wt 257 lb (116.574 kg)  SpO2 99% Wt Readings from Last 3 Encounters:  03/03/16 257 lb (116.574 kg)  02/19/16 255 lb (115.667 kg)  01/26/16 254 lb (115.214 kg)    General: Appears her stated age, in NAD. HEENT: Head: normal shape and size, no sinus tenderness noted; Ears: right TM with serous fluid. TM not bulging. left TM slightly retracted wth minimal fluid. Nose: mucosa pink and moist, septum midline; Throat/Mouth: Teeth present, mucosa pink and moist, no exudate,  lesions or ulcerations noted. +PND.  Neck:  No adenopathy noted. Cardiovascular: Normal rate and rhythm. S1,S2 noted.  No murmur, rubs or gallops noted.  Pulmonary/Chest: Normal effort and positive vesicular breath sounds. No respiratory distress. No wheezes, rales or ronchi noted.   BMET    Component Value Date/Time   NA 139 01/19/2016 0848   K 3.5 01/19/2016 0848   CL 105 01/19/2016 0848   CO2 29 01/19/2016 0848   GLUCOSE 98 01/19/2016 0848   BUN 15 01/19/2016 0848   CREATININE 0.70 01/19/2016 0848   CREATININE 0.62 01/29/2014 1053   CALCIUM 9.6 01/19/2016 0848   GFRNONAA >89 01/29/2014 1053   GFRNONAA >60 10/30/2008 1049   GFRAA >89 01/29/2014 1053   GFRAA  10/30/2008 1049    >60        The eGFR has been calculated using the MDRD equation. This calculation has not been validated in all clinical    Lipid Panel     Component Value Date/Time   CHOL 179 01/19/2016 0848   TRIG 96.0 01/19/2016 0848   HDL 43.10 01/19/2016 0848   CHOLHDL 4 01/19/2016 0848   VLDL 19.2 01/19/2016 0848   LDLCALC 116* 01/19/2016 0848    CBC    Component Value Date/Time   WBC 6.5 01/19/2016 0848   WBC 8.0 01/14/2012 1139   RBC 4.70 01/19/2016 0848   RBC 4.93 01/14/2012 1139   HGB 13.0 01/19/2016 0848   HGB 12.9 01/14/2012 1139   HCT 38.0 01/19/2016 0848   HCT 40.5 01/14/2012 1139   PLT 249.0 01/19/2016 0848   MCV 80.9 01/19/2016 0848   MCV 82.1 01/14/2012 1139   MCH 27.3 01/29/2014 1053   MCH 62.2* 01/14/2012 1139   MCHC 34.3 01/19/2016 0848   MCHC 31.9 01/14/2012 1139   RDW 13.5 01/19/2016 0848   LYMPHSABS 1.4 01/29/2014 1053   MONOABS 0.7 01/29/2014 1053   EOSABS 0.1 01/29/2014 1053   BASOSABS 0.0 01/29/2014 1053    Hgb A1C Lab Results  Component Value Date   HGBA1C 5.3 01/19/2016         Assessment & Plan:   Barotitis media:  No indication for repeat abx eRx for Pred Taper x 6 days If persist, consider ENT referral  RTC as needed or if symptoms persist or  worsen

## 2016-03-03 NOTE — Patient Instructions (Signed)

## 2016-03-03 NOTE — Progress Notes (Signed)
Pre visit review using our clinic review tool, if applicable. No additional management support is needed unless otherwise documented below in the visit note. 

## 2016-05-07 ENCOUNTER — Other Ambulatory Visit: Payer: Self-pay | Admitting: Internal Medicine

## 2016-08-04 ENCOUNTER — Ambulatory Visit (INDEPENDENT_AMBULATORY_CARE_PROVIDER_SITE_OTHER): Payer: Commercial Managed Care - PPO | Admitting: Internal Medicine

## 2016-08-04 ENCOUNTER — Encounter: Payer: Self-pay | Admitting: Internal Medicine

## 2016-08-04 VITALS — BP 120/84 | HR 76 | Temp 98.3°F | Wt 257.0 lb

## 2016-08-04 DIAGNOSIS — R140 Abdominal distension (gaseous): Secondary | ICD-10-CM

## 2016-08-04 DIAGNOSIS — R194 Change in bowel habit: Secondary | ICD-10-CM

## 2016-08-04 DIAGNOSIS — R3915 Urgency of urination: Secondary | ICD-10-CM | POA: Diagnosis not present

## 2016-08-04 DIAGNOSIS — R102 Pelvic and perineal pain: Secondary | ICD-10-CM | POA: Diagnosis not present

## 2016-08-04 LAB — POC URINALSYSI DIPSTICK (AUTOMATED)
BILIRUBIN UA: NEGATIVE
Glucose, UA: NEGATIVE
KETONES UA: NEGATIVE
Leukocytes, UA: NEGATIVE
NITRITE UA: NEGATIVE
PH UA: 5
Protein, UA: NEGATIVE
Urobilinogen, UA: NEGATIVE

## 2016-08-04 NOTE — Patient Instructions (Signed)

## 2016-08-04 NOTE — Progress Notes (Signed)
HPI  Pt presents to the clinic today with c/o urinary urgency and pelvic cramping. She reports this started 6 days ago. It started with some abdominal bloating and cramping in her RLQ. She thought it was gas so she tried [place] without any relief. She then thought she may be constipated, so she tried Dulcolax. That caused her to have liquid stool for about 2 hours. She is moving her bowels but not quite as regular as before. She started this morning with low back pain and noted that sexual intercourse was very uncomfortable, which it never is. She denies frequency, dysuria, blood in her urine. She denies vaginal discharge, odor or abnormal bleeding.   Review of Systems  Past Medical History:  Diagnosis Date  . Allergic rhinitis   . Chicken pox   . GERD (gastroesophageal reflux disease)   . Hypertension   . Miscarriage 1998/2008  . Vitamin D deficiency     Family History  Problem Relation Age of Onset  . Hypertension Mother   . Stroke Mother   . Atrial fibrillation Mother   . Diabetes Mother   . Rheum arthritis Maternal Grandmother   . Coronary artery disease Maternal Grandmother   . Hypertension Maternal Grandmother   . Cancer Neg Hx     Social History   Social History  . Marital status: Married    Spouse name: N/A  . Number of children: N/A  . Years of education: N/A   Occupational History  . Not on file.   Social History Main Topics  . Smoking status: Former Smoker    Quit date: 11/28/2001  . Smokeless tobacco: Never Used  . Alcohol use 0.0 oz/week     Comment: rare  . Drug use: No  . Sexual activity: Yes   Other Topics Concern  . Not on file   Social History Narrative  . No narrative on file    Allergies  Allergen Reactions  . Red Yeast Rice [Cholestin]     Cramps/Heartburn    Constitutional: Denies fever, malaise, fatigue, headache or abrupt weight changes.   GI: Pt reports bloating and cramping. Denies diarrhea or blood in her stool. GU: Pt reports  urgency. Denies frequency, dysuria, burning sensation, blood in urine, odor or discharge. Skin: Denies redness, rashes, lesions or ulcercations.   No other specific complaints in a complete review of systems (except as listed in HPI above).    Objective:   Physical Exam  BP 120/84   Pulse 76   Temp 98.3 F (36.8 C) (Oral)   Wt 257 lb (116.6 kg)   LMP 07/18/2016   SpO2 98%   BMI 44.11 kg/m   Wt Readings from Last 3 Encounters:  08/04/16 257 lb (116.6 kg)  03/03/16 257 lb (116.6 kg)  02/19/16 255 lb (115.7 kg)    General: Appears her stated age, obese in NAD. Cardiovascular: Normal rate and rhythm. S1,S2 noted.   Pulmonary/Chest: Normal effort and positive vesicular breath sounds. No respiratory distress. No wheezes, rales or ronchi noted.  Abdomen: Soft. Normal bowel sounds. No distention or masses noted.  Tender to palpation in the suprapubic area. No CVA tenderness.      Assessment & Plan:   Urinary urgency, pelvic pain:  Urinalysis: trace blood Will send urine culture Drink plenty of fluids Sounds more like a gyn issue Advised her to try Ibuprofen OTC If urine culture normal, will pursue pelvic and transvaginal ultrasound  Bloating and change in bowels:  Drink plenty of fluids Consider  taking a Probiotic for the next 30 days to see if it helps  Will follow up after culture is back, RTC as needed   Webb Silversmith, NP

## 2016-08-05 LAB — URINE CULTURE: Organism ID, Bacteria: NO GROWTH

## 2016-09-01 ENCOUNTER — Ambulatory Visit (INDEPENDENT_AMBULATORY_CARE_PROVIDER_SITE_OTHER): Payer: Commercial Managed Care - PPO | Admitting: Family Medicine

## 2016-09-01 ENCOUNTER — Encounter: Payer: Self-pay | Admitting: Family Medicine

## 2016-09-01 VITALS — BP 112/81 | HR 81 | Ht 64.25 in | Wt 257.4 lb

## 2016-09-01 DIAGNOSIS — Z9109 Other allergy status, other than to drugs and biological substances: Secondary | ICD-10-CM

## 2016-09-01 DIAGNOSIS — I10 Essential (primary) hypertension: Secondary | ICD-10-CM

## 2016-09-01 DIAGNOSIS — Z8619 Personal history of other infectious and parasitic diseases: Secondary | ICD-10-CM | POA: Insufficient documentation

## 2016-09-01 DIAGNOSIS — E781 Pure hyperglyceridemia: Secondary | ICD-10-CM | POA: Diagnosis not present

## 2016-09-01 DIAGNOSIS — E559 Vitamin D deficiency, unspecified: Secondary | ICD-10-CM

## 2016-09-01 DIAGNOSIS — E785 Hyperlipidemia, unspecified: Secondary | ICD-10-CM

## 2016-09-01 NOTE — Assessment & Plan Note (Signed)
No RAD/ asthmatic allergies---> only for Allergic Rhinitis/  Allergic sinusitis

## 2016-09-01 NOTE — Assessment & Plan Note (Addendum)
Pre-eclampsia with first preg; Ever since second pregnancy- been on HTN meds.  Been on same 7 yrs now.  Runs 118-126/ 80-84---> well controlled.

## 2016-09-01 NOTE — Patient Instructions (Addendum)
Please use the lose it app or my fitness [place] to track all your foods. Don't purposely try to eat healthy or anything, just track everything you put in your mouth. We can discuss this at follow-up one we are reviewing your labs.   Please realize, EXERCISE IS MEDICINE!  -  American Heart Association [facility]) guidelines for exercise : If you are in good health, without any medical conditions, you should engage in 150 minutes of moderate intensity aerobic activity per week.  This means you should be huffing and puffing throughout your workout.   Engaging in regular exercise will improve brain function and memory, as well as improve mood, boost immune system and help with weight management.  As well as the other, more well-known effects of exercise such as decreasing blood sugar levels, decreasing blood pressure,  and decreasing bad cholesterol levels/ increasing good cholesterol levels.     -  The AHA strongly endorses consumption of a diet that contains a variety of foods from all the food categories with an emphasis on fruits and vegetables; fat-free and low-fat dairy products; cereal and grain products; legumes and nuts; and fish, poultry, and/or extra lean meats.    Excessive food intake, especially of foods high in saturated and trans fats, sugar, and salt, should be avoided.    Adequate water intake of roughly 1/2 of your weight in pounds, should equal the ounces of water per day you should drink.  So for instance, if you're 200 pounds, that would be 100 ounces of water per day.         Mediterranean Diet  Why follow it? Research shows. . Those who follow the Mediterranean diet have a reduced risk of heart disease  . The diet is associated with a reduced incidence of Parkinson's and Alzheimer's diseases . People following the diet may have longer life expectancies and lower rates of chronic diseases  . The Dietary Guidelines for Americans recommends the Mediterranean diet as an eating plan to  promote health and prevent disease  What Is the Mediterranean Diet?  . Healthy eating plan based on typical foods and recipes of Mediterranean-style cooking . The diet is primarily a plant based diet; these foods should make up a majority of meals   Starches - Plant based foods should make up a majority of meals - They are an important sources of vitamins, minerals, energy, antioxidants, and fiber - Choose whole grains, foods high in fiber and minimally processed items  - Typical grain sources include wheat, oats, barley, corn, brown rice, bulgar, farro, millet, polenta, couscous  - Various types of beans include chickpeas, lentils, fava beans, black beans, white beans   Fruits  Veggies - Large quantities of antioxidant rich fruits & veggies; 6 or more servings  - Vegetables can be eaten raw or lightly drizzled with oil and cooked  - Vegetables common to the traditional Mediterranean Diet include: artichokes, arugula, beets, broccoli, brussel sprouts, cabbage, carrots, celery, collard greens, cucumbers, eggplant, kale, leeks, lemons, lettuce, mushrooms, okra, onions, peas, peppers, potatoes, pumpkin, radishes, rutabaga, shallots, spinach, sweet potatoes, turnips, zucchini - Fruits common to the Mediterranean Diet include: apples, apricots, avocados, cherries, clementines, dates, figs, grapefruits, grapes, melons, nectarines, oranges, peaches, pears, pomegranates, strawberries, tangerines  Fats - Replace butter and margarine with healthy oils, such as olive oil, canola oil, and tahini  - Limit nuts to no more than a handful a day  - Nuts include walnuts, almonds, pecans, pistachios, pine nuts  - Limit  or avoid candied, honey roasted or heavily salted nuts - Olives are central to the Mediterranean diet - can be eaten whole or used in a variety of dishes   Meats Protein - Limiting red meat: no more than a few times a month - When eating red meat: choose lean cuts and keep the portion to the size of  deck of cards - Eggs: approx. 0 to 4 times a week  - Fish and lean poultry: at least 2 a week  - Healthy protein sources include, chicken, [place], lean beef, lamb - Increase intake of seafood such as tuna, salmon, trout, mackerel, shrimp, scallops - Avoid or limit high fat processed meats such as sausage and bacon  Dairy - Include moderate amounts of low fat dairy products  - Focus on healthy dairy such as fat free yogurt, skim milk, low or reduced fat cheese - Limit dairy products higher in fat such as whole or 2% milk, cheese, ice cream  Alcohol - Moderate amounts of red wine is ok  - No more than 5 oz daily for women (all ages) and men older than age 29  - No more than 10 oz of wine daily for men younger than 42  Other - Limit sweets and other desserts  - Use herbs and spices instead of salt to flavor foods  - Herbs and spices common to the traditional Mediterranean Diet include: basil, bay leaves, chives, cloves, cumin, fennel, garlic, lavender, marjoram, mint, oregano, parsley, pepper, rosemary, sage, savory, sumac, tarragon, thyme   It's not just a diet, it's a lifestyle:  . The Mediterranean diet includes lifestyle factors typical of those in the region  . Foods, drinks and meals are best eaten with others and savored . Daily physical activity is important for overall good health . This could be strenuous exercise like running and aerobics . This could also be more leisurely activities such as walking, housework, yard-work, or taking the stairs . Moderation is the key; a balanced and healthy diet accommodates most foods and drinks . Consider portion sizes and frequency of consumption of certain foods   Meal Ideas & Options:  . Breakfast:  o Whole wheat toast or whole wheat English muffins with peanut butter & hard boiled egg o Steel cut oats topped with apples & cinnamon and skim milk  o Fresh fruit: banana, strawberries, melon, berries, peaches  o Smoothies: strawberries,  bananas, greek yogurt, peanut butter o Low fat greek yogurt with blueberries and granola  o Egg white omelet with spinach and mushrooms o Breakfast couscous: whole wheat couscous, apricots, skim milk, cranberries  . Sandwiches:  o Hummus and grilled vegetables (peppers, zucchini, squash) on whole wheat bread   o Grilled chicken on whole wheat pita with lettuce, tomatoes, cucumbers or tzatziki  o Tuna salad on whole wheat bread: tuna salad made with greek yogurt, olives, red peppers, capers, green onions o Garlic rosemary lamb pita: lamb sauted with garlic, rosemary, salt & pepper; add lettuce, cucumber, greek yogurt to pita - flavor with lemon juice and black pepper  . Seafood:  o Mediterranean grilled salmon, seasoned with garlic, basil, parsley, lemon juice and black pepper o Shrimp, lemon, and spinach whole-grain pasta salad made with low fat greek yogurt  o Seared scallops with lemon orzo  o Seared tuna steaks seasoned salt, pepper, coriander topped with tomato mixture of olives, tomatoes, olive oil, minced garlic, parsley, green onions and cappers  . Meats:  o Herbed greek chicken salad with  [place] olives, cucumber, feta  o Red bell peppers stuffed with spinach, bulgur, lean ground beef (or lentils) & topped with feta   o Kebabs: skewers of chicken, tomatoes, onions, zucchini, squash  o [place] burgers: made with red onions, mint, dill, lemon juice, feta cheese topped with roasted red peppers . Vegetarian o Cucumber salad: cucumbers, artichoke hearts, celery, red onion, feta cheese, tossed in olive oil & lemon juice  o Hummus and whole grain pita points with a greek salad (lettuce, tomato, feta, olives, cucumbers, red onion) o Lentil soup with celery, carrots made with vegetable broth, garlic, salt and pepper  o Tabouli salad: parsley, bulgur, mint, scallions, cucumbers, tomato, radishes, lemon juice, olive oil, salt and pepper.        This is just information for you that we  can discuss next time----->   Guidelines for Losing Weight   We want weight loss that will last so you should lose 1-2 pounds a week.  THAT IS IT! Please pick THREE things a month to change. Once it is a habit check off the item. Then pick another three items off the list to become habits.  If you are already doing a habit on the list GREAT!  Cross that item off!  Don't drink your calories. Ie, alcohol, soda, fruit juice, and sweet tea.   Drink more water. Drink a glass when you feel hungry or before each meal.   Eat breakfast - Complex carb and protein (likeDannon light and fit yogurt, oatmeal, fruit, eggs, [place] bacon).  Measure your cereal.  Eat no more than one cup a day. (ie Kashi)  Eat an apple a day.  Add a vegetable a day.  Try a new vegetable a month.  Use Pam! Stop using oil or butter to cook.  Don't finish your plate or use smaller plates.  Share your dessert.  Eat sugar free Jello for dessert or frozen grapes.  Don't eat 2-3 hours before bed.  Switch to whole wheat bread, pasta, and brown rice.  Make healthier choices when you eat out. No fries!  Pick baked chicken, NOT fried.  Don't forget to SLOW DOWN when you eat. It is not going anywhere.   Take the stairs.  Park far away in the parking lot  Lift soup cans (or weights) for 10 minutes while watching TV.  Walk at work for 10 minutes during break.  Walk outside 1 time a week with your friend, kids, dog, or significant other.  Start a walking group at church.  Walk the mall as much as you can tolerate.   Keep a food diary.  Weigh yourself daily.  Walk for 15 minutes 3 days per week.  Cook at home more often and eat out less. If life happens and you go back to old habits, it is okay.  Just start over. You can do it!  If you experience chest pain, get short of breath, or tired during the exercise, please stop immediately and inform your doctor.    Before you even begin to attack a  weight-loss plan, it pays to remember this: You are not fat. You have fat. Losing weight isn't about blame or shame; it's simply another achievement to accomplish. Dieting is like any other skill-you have to buckle down and work at it. As long as you act in a smart, reasonable way, you'll ultimately get where you want to be. Here are some weight loss pearls for you.   1. It's Not a Diet.  It's a Lifestyle Thinking of a diet as something you're on and suffering through only for the short term doesn't work. To shed weight and keep it off, you need to make permanent changes to the way you eat. It's OK to indulge occasionally, of course, but if you cut calories temporarily and then revert to your old way of eating, you'll gain back the weight quicker than you can say yo-yo. Use it to lose it. Research shows that one of the best predictors of long-term weight loss is how many pounds you drop in the first month. For that reason, nutritionists often suggest being stricter for the first two weeks of your new eating strategy to build momentum. Cut out added sugar and alcohol and avoid unrefined carbs. After that, figure out how you can reincorporate them in a way that's healthy and maintainable.  2. There's a Right Way to Exercise Working out burns calories and fat and boosts your metabolism by building muscle. But those trying to lose weight are notorious for overestimating the number of calories they burn and underestimating the amount they take in. Unfortunately, your system is biologically programmed to hold on to extra pounds and that means when you start exercising, your body senses the deficit and ramps up its hunger signals. If you're not diligent, you'll eat everything you burn and then some. Use it, to lose it. Cardio gets all the exercise glory, but strength and interval training are the real heroes. They help you build lean muscle, which in turn increases your metabolism and calorie-burning ability 3.  Don't Overreact to Mild Hunger Some people have a hard time losing weight because of hunger anxiety. To them, being hungry is bad-something to be avoided at all costs-so they carry snacks with them and eat when they don't need to. Others eat because they're stressed out or bored. While you never want to get to the point of being ravenous (that's when bingeing is likely to happen), a hunger pang, a craving, or the fact that it's 3:00 p.m. should not send you racing for the vending machine or obsessing about the energy bar in your purse. Ideally, you should put off eating until your stomach is growling and it's difficult to concentrate.  Use it to lose it. When you feel the urge to eat, use the HALT method. Ask yourself, Am I really hungry? Or am I angry or anxious, lonely or bored, or tired? If you're still not certain, try the apple test. If you're truly hungry, an apple should seem delicious; if it doesn't, something else is going on. Or you can try drinking water and making yourself busy, if you are still hungry try a healthy snack.  4. Not All Calories Are Created Equal The mechanics of weight loss are pretty simple: Take in fewer calories than you use for energy. But the kind of food you eat makes all the difference. Processed food that's high in saturated fat and refined starch or sugar can cause inflammation that disrupts the hormone signals that tell your brain you're full. The result: You eat a lot more.  Use it to lose it. Clean up your diet. Swap in whole, unprocessed foods, including vegetables, lean protein, and healthy fats that will fill you up and give you the biggest nutritional bang for your calorie buck. In a few weeks, as your brain starts receiving regular hunger and fullness signals once again, you'll notice that you feel less hungry overall and naturally start cutting back on the  amount you eat.  5. Protein, Produce, and Plant-Based Fats Are Your Weight-Loss Trinity Here's why eating the  three Ps regularly will help you drop pounds. Protein fills you up. You need it to build lean muscle, which keeps your metabolism humming so that you can torch more fat. People in a weight-loss program who ate double the recommended daily allowance for protein (about 110 grams for a 150-pound woman) lost 70 percent of their weight from fat, while people who ate the RDA lost only about 40 percent, one study found. Produce is packed with filling fiber. "It's very difficult to consume too many calories if you're eating a lot of vegetables. Example: Three cups of broccoli is a lot of food, yet only 93 calories. (Fruit is another story. It can be easy to overeat and can contain a lot of calories from sugar, so be sure to monitor your intake.) Plant-based fats like olive oil and those in avocados and nuts are healthy and extra satiating.  Use it to lose it. Aim to incorporate each of the three Ps into every meal and snack. People who eat protein throughout the day are able to keep weight off, according to a study in the [place] of Clinical Nutrition. In addition to meat, poultry and seafood, good sources are beans, lentils, eggs, tofu, and yogurt. As for fat, keep portion sizes in check by measuring out salad dressing, oil, and nut butters (shoot for one to two tablespoons). Finally, eat veggies or a little fruit at every meal. People who did that consumed 308 fewer calories but didn't feel any hungrier than when they didn't eat more produce.  7. How You Eat Is As Important As What You Eat In order for your brain to register that you're full, you need to focus on what you're eating. Sit down whenever you eat, preferably at a table. Turn off the TV or computer, put down your phone, and look at your food. Smell it. Chew slowly, and don't put another bite on your fork until you swallow. When women ate lunch this attentively, they consumed 30 percent less when snacking later than those who listened to an  audiobook at lunchtime, according to a study in the [place] of Nutrition. 8. Weighing Yourself Really Works The scale provides the best evidence about whether your efforts are paying off. Seeing the numbers tick up or down or stagnate is motivation to keep going-or to rethink your approach. A 2015 study at [facility] found that daily weigh-ins helped people lose more weight, keep it off, and maintain that loss, even after two years. Use it to lose it. Step on the scale at the same time every day for the best results. If your weight shoots up several pounds from one weigh-in to the next, don't freak out. Eating a lot of salt the night before or having your period is the likely culprit. The number should return to normal in a day or two. It's a steady climb that you need to do something about. 9. Too Much Stress and Too Little Sleep Are Your Enemies When you're tired and frazzled, your body cranks up the production of cortisol, the stress hormone that can cause carb cravings. Not getting enough sleep also boosts your levels of ghrelin, a hormone associated with hunger, while suppressing leptin, a hormone that signals fullness and satiety. People on a diet who slept only five and a half hours a night for two weeks lost 55 percent less fat and  were hungrier than those who slept eight and a half hours, according to a study in the [place]. Use it to lose it. Prioritize sleep, aiming for seven hours or more a night, which research shows helps lower stress. And make sure you're getting quality zzz's. If a snoring spouse or a fidgety cat wakes you up frequently throughout the night, you may end up getting the equivalent of just four hours of sleep, according to a study from [facility]. Keep pets out of the bedroom, and use a white-noise app to drown out snoring. 10. You Will Hit a plateau-And You Can Bust Through It As you slim down, your body releases much less  leptin, the fullness hormone.  If you're not strength training, start right now. Building muscle can raise your metabolism to help you overcome a plateau. To keep your body challenged and burning calories, incorporate new moves and more intense intervals into your workouts or add another sweat session to your weekly routine. Alternatively, cut an extra 100 calories or so a day from your diet. Now that you've lost weight, your body simply doesn't need as much fuel.      Since food equals calories, in order to lose weight you must either eat fewer calories, exercise more to burn off calories with activity, or both. Food that is not used to fuel the body is stored as fat. A major component of losing weight is to make smarter food choices. Here's how:  1)   Limit non-nutritious foods, such as: Sugar, honey, syrups and candy Pastries, donuts, pies, cakes and cookies Soft drinks, sweetened juices and alcoholic beverages  2)  Cut down on high-fat foods by: - Choosing poultry, fish or lean red meat - Choosing low-fat cooking methods, such as baking, broiling, steaming, grilling and boiling - Using low-fat or non-fat dairy products - Using vinaigrette, herbs, lemon or fat-free salad dressings - Avoiding fatty meats, such as bacon, sausage, franks, ribs and luncheon meats - Avoiding high-fat snacks like nuts, chips and chocolate - Avoiding fried foods - Using less butter, margarine, oil and mayonnaise - Avoiding high-fat gravies, cream sauces and cream-based soups  3) Eat a variety of foods, including: - Fruit and vegetables that are raw, steamed or baked - Whole grains, breads, cereal, rice and pasta - Dairy products, such as low-fat or non-fat milk or yogurt, low-fat cottage cheese and low-fat cheese - Protein-rich foods like chicken, [place], fish, lean meat and legumes, or beans  4) Change your eating habits by: - Eat three balanced meals a day to help control your hunger - Watch portion  sizes and eat small servings of a variety of foods - Choose low-calorie snacks - Eat only when you are hungry and stop when you are satisfied - Eat slowly and try not to perform other tasks while eating - Find other activities to distract you from food, such as walking, taking up a hobby or being involved in the community - Include regular exercise in your daily routine ( minimum of 20 min of moderate-intensity exercise at least 5 days/week)  - Find a support group, if necessary, for emotional support in your weight loss journey      Easy ways to cut 100 calories  1. Eat your eggs with hot sauce OR salsa instead of cheese.  Eggs are great for breakfast, but many people consider eggs and cheese to be BFFs. Instead of cheese-1 oz. of cheddar has 114 calories-top your eggs with  hot sauce, which contains no calories and helps with satiety and metabolism. Salsa is also a great option!!  2. Top your toast, waffles or pancakes with fresh berries instead of jelly or syrup. Half a cup of berries-fresh, frozen or thawed-has about 40 calories, compared with 2 tbsp. of maple syrup or jelly, which both have about 100 calories. The berries will also give you a good punch of fiber, which helps keep you full and satisfied and won't spike blood sugar quickly like the jelly or syrup. 3. Swap the non-fat latte for black coffee with a splash of half-and-half. Contrary to its name, that non-fat latte has 130 calories and a startling 19g of carbohydrates per 16 oz. serving. Replacing that 'light' drinkable dessert with a black coffee with a splash of half-and-half saves you more than 100 calories per 16 oz. serving. 4. Sprinkle salads with freeze-dried raspberries instead of dried cranberries. If you want a sweet addition to your nutritious salad, stay away from dried cranberries. They have a whopping 130 calories per  cup and 30g carbohydrates. Instead, sprinkle freeze-dried raspberries guilt-free and save more than  100 calories per  cup serving, adding 3g of belly-filling fiber. 5. Go for mustard in place of mayo on your sandwich. Mustard can add really nice flavor to any sandwich, and there are tons of varieties, from spicy to honey. A serving of mayo is 95 calories, versus 10 calories in a serving of mustard.  Or try an avocado mayo spread: You can find the recipe few click this link: https://www.californiaavocado.com/recipes/recipe-container/[place]-avocado-mayo 6. Choose a DIY salad dressing instead of the store-bought kind. Mix Dijon or whole grain mustard with low-fat Kefir or red wine vinegar and garlic. 7. Use hummus as a spread instead of a dip. Use hummus as a spread on a high-fiber cracker or tortilla with a sandwich and save on calories without sacrificing taste. 8. Pick just one salad "accessory." Salad isn't automatically a calorie winner. It's easy to over-accessorize with toppings. Instead of topping your salad with nuts, avocado and cranberries (all three will clock in at 313 calories), just pick one. The next day, choose a different accessory, which will also keep your salad interesting. You don't wear all your jewelry every day, right? 9. Ditch the white pasta in favor of spaghetti squash. One cup of cooked spaghetti squash has about 40 calories, compared with traditional spaghetti, which comes with more than 200. Spaghetti squash is also nutrient-dense. It's a good source of fiber and Vitamins A and C, and it can be eaten just like you would eat pasta-with a great tomato sauce and [place] meatballs or with pesto, tofu and spinach, for example. 10. Dress up your chili, soups and stews with non-fat [place] yogurt instead of sour cream. Just a 'dollop' of sour cream can set you back 115 calories and a whopping 12g of fat-seven of which are of the artery-clogging variety. Added bonus: [place] yogurt is packed with muscle-building protein, calcium and B Vitamins. 11. Mash cauliflower instead of  mashed potatoes. One cup of traditional mashed potatoes-in all their creamy goodness-has more than 200 calories, compared to mashed cauliflower, which you can typically eat for less than 100 calories per 1 cup serving. Cauliflower is a great source of the antioxidant indole-3-carbinol (I3C), which may help reduce the risk of some cancers, like breast cancer. 12. Ditch the ice cream sundae in favor of a [place] yogurt parfait. Instead of a cup of ice cream or fro-yo for dessert, try 1 cup  of nonfat [place] yogurt topped with fresh berries and a sprinkle of cacao nibs. Both toppings are packed with antioxidants, which can help reduce cellular inflammation and oxidative damage. And the comparison is a no-brainer: One cup of ice cream has about 275 calories; one cup of frozen yogurt has about 230; and a cup of Greek yogurt has just 130, plus twice the protein, so you're less likely to return to the freezer for a second helping. 13. Put olive oil in a spray container instead of using it directly from the bottle. Each tablespoon of olive oil is 120 calories and 15g of fat. Use a mister instead of pouring it straight into the pan or onto a salad. This allows for portion control and will save you more than 100 calories. 14. When baking, substitute canned pumpkin for butter or oil. Canned pumpkin-not pumpkin pie mix-is loaded with Vitamin A, which is important for skin and eye health, as well as immunity. And the comparisons are pretty crazy:  cup of canned pumpkin has about 40 calories, compared to butter or oil, which has more than 800 calories. Yes, 800 calories. Applesauce and mashed banana can also serve as good substitutions for butter or oil, usually in a 1:1 ratio. 15. Top casseroles with high-fiber cereal instead of breadcrumbs. Breadcrumbs are typically made with white bread, while breakfast cereals contain 5-9g of fiber per serving. Not only will you save more than 150 calories per  cup serving, the swap  will also keep you more full and you'll get a metabolism boost from the added fiber. 16. Snack on pistachios instead of macadamia nuts. Believe it or not, you get the same amount of calories from 35 pistachios (100 calories) as you would from only five macadamia nuts. 17. Chow down on kale chips rather than potato chips. This is my favorite 'don't knock it 'till you try it' swap. Kale chips are so easy to make at home, and you can spice them up with a little grated parmesan or chili powder. Plus, they're a mere fraction of the calories of potato chips, but with the same crunch factor we crave so often. 18. Add seltzer and some fruit slices to your cocktail instead of soda or fruit juice. One cup of soda or fruit juice can pack on as much as 140 calories. Instead, use seltzer and fruit slices. The fruit provides valuable phytochemicals, such as flavonoids and anthocyanins, which help to combat cancer and stave off the aging process.      Phentermine  While taking the medication we may ask that you come into the office once a month or once every 2-3 months to monitor your weight, blood pressure, and heart rate. In addition we can help answer your questions about diet, exercise, and help you every step of the way with your weight loss journey. Sometime it is helpful if you bring in a food diary or use an app on your phone such as myfitnesspal to record your calorie intake, especially in the beginning.   You can start out on 1/3 to 1/2 a pill in the morning and if you are tolerating it well you can increase to one pill daily. I also have some patients that take 1/3 or 1/2 at lunch to help prevent night time eating.  This medication is cheapest CASH pay at [facility] is 14-17 dollars and you do NOT need a membership to get meds from there.    What is this medicine?  PHENTERMINE (FEN ter meen) decreases your appetite. This medicine is intended to be used in addition to a  healthy reduced calorie diet and exercise. The best results are achieved this way. This medicine is only indicated for short-term use. Eventually your weight loss may level out and the medication will no longer be needed.   How should I use this medicine? Take this medicine by mouth. Follow the directions on the prescription label. The tablets should stay in the bottle until immediately before you take your dose. Take your doses at regular intervals. Do not take your medicine more often than directed.  Overdosage: If you think you have taken too much of this medicine contact a poison control center or emergency room at once. NOTE: This medicine is only for you. Do not share this medicine with others.  What if I miss a dose? If you miss a dose, take it as soon as you can. If it is almost time for your next dose, take only that dose. Do not take double or extra doses. Do not increase or in any way change your dose without consulting your doctor.  What should I watch for while using this medicine? Notify your physician immediately if you become short of breath while doing your normal activities. Do not take this medicine within 6 hours of bedtime. It can keep you from getting to sleep. Avoid drinks that contain caffeine and try to stick to a regular bedtime every night. Do not stand or sit up quickly, especially if you are an older patient. This reduces the risk of dizzy or fainting spells. Avoid alcoholic drinks.  What side effects may I notice from receiving this medicine? Side effects that you should report to your doctor or health care professional as soon as possible: -chest pain, palpitations -depression or severe changes in mood -increased blood pressure -irritability -nervousness or restlessness -severe dizziness -shortness of breath -problems urinating -unusual swelling of the legs -vomiting  Side effects that usually do not require medical attention (report to your doctor or  health care professional if they continue or are bothersome): -blurred vision or other eye problems -changes in sexual ability or desire -constipation or diarrhea -difficulty sleeping -dry mouth or unpleasant taste -headache -nausea This list may not describe all possible side effects. Call your doctor for medical advice about side effects. You may report side effects to FDA at 1-800-FDA-1088.

## 2016-09-01 NOTE — Progress Notes (Signed)
New patient office visit note:  Impression and Recommendations:    1. Benign essential hypertension   2. Severe obesity (BMI >= 40)   3. Hypertriglyceridemia   4. Hyperlipidemia, unspecified hyperlipidemia type   5. H/O tinea corporis   6. Hypomagnesemia   7. Environmental allergies   8. Vitamin D deficiency     Benign essential hypertension Pre-eclampsia with first preg; Ever since second pregnancy- been on HTN meds.  Been on same 7 yrs now.  Runs 118-126/ 80-84---> well controlled.   Environmental allergies No RAD/ asthmatic allergies---> only for Allergic Rhinitis/  Allergic sinusitis  Severe obesity (BMI >= 40) Explained to patient what BMI refers to, and what it means medically.    Told patient to think about it as a "medical risk stratification measurement" and how increasing BMI is associated with increasing risk/ or worsening state of various diseases such as hypertension, hyperlipidemia, diabetes, premature OA, depression etc.  American Heart Association guidelines for healthy diet, basically Mediterranean diet, and exercise guidelines of 30 minutes 5 days per week or more discussed in detail.  Please use the lose it app or my fitness [place] [place] to track all your foods. Don't purposely try to eat healthy or anything, just track everything you put in your mouth. We can discuss this at follow-up one we are reviewing your labs.  We briefly discussed phentermine usage and other weight loss drugs.   Health counseling performed.  All questions answered.  Hyperlipidemia Fish oil daily, diet controlled  Will check near future with fasting blood work  Pt was in the office today for 40+ minutes, with over 50% time spent in face to face counseling of various medical concerns and in coordination of care  New Prescriptions   No medications on file    Modified Medications   No medications on file    Return for Follow-up in the near future for fasting blood work and  then 1-2 week follow-up OV - labs and wt los.  The patient was counseled, risk factors were discussed, anticipatory guidance given.   Please see AVS handed out to patient at the end of our visit for further patient instructions/ counseling done pertaining to today's office visit.    Note: This document was prepared using Dragon voice recognition software and may include unintentional dictation errors.  ----------------------------------------------------------------------------------------------------------------------    Subjective:    Chief Complaint  Patient presents with  . Establish Care    HPI: Gina Farrell is a pleasant 45 y.o. female who presents to [facility] today to review their medical history with me and establish care.   I asked the patient to review their chronic problem list with me to ensure everything was updated and accurate.     Patient is married 11 years to [employer] who is the [employer], 3 kids ages 59, 71, 48. Works as Solicitor for [employer]  Patient with 7-pack-year history of smoking and quit in 2003.  Walks\dances one hour 2-3 times a week.  Family history is negative for diabetes and stroke in her mother  Hypertension:  Had preeclampsia with her first pregnancy. And ever since her second pregnancy, 17 years ago, she's been on anti--hypertensive medicines.  Stable, well controlled always under 140/90 regularly.  Obesity:  Struggled most of her life. Various diets, always been difficult to keep it off.    Wt Readings from Last 3 Encounters:  09/01/16 257 lb 6.4 oz (116.8 kg)  08/04/16 257 lb (116.6 kg)  03/03/16 257 lb (116.6 kg)   BP Readings from Last 3 Encounters:  09/01/16 112/81  08/04/16 120/84  03/03/16 118/82   Pulse Readings from Last 3 Encounters:  09/01/16 81  08/04/16 76  03/03/16 87   BMI Readings from Last 3 Encounters:  09/01/16 43.84 kg/m  08/04/16 44.11 kg/m   03/03/16 44.11 kg/m    Patient Active Problem List   Diagnosis Date Noted  . Allergic rhinitis 09/18/2016  . H/O tinea corporis 09/01/2016  . Hypomagnesemia 09/01/2016  . Hypertriglyceridemia 09/01/2016  . GERD (gastroesophageal reflux disease) 01/13/2015  . Severe obesity (BMI >= 40) 01/13/2015  . Environmental allergies   . Hyperlipidemia   . Vitamin D deficiency   . Benign essential hypertension 02/27/2012     Past Medical History:  Diagnosis Date  . Allergic rhinitis   . Chicken pox   . GERD (gastroesophageal reflux disease)   . Hypertension   . Miscarriage 1998/2008  . Vitamin D deficiency   . Vitamin D deficiency      Past Surgical History:  Procedure Laterality Date  . CESAREAN SECTION     x 2  . d and c    . TUBAL LIGATION       Family History  Problem Relation Age of Onset  . Hypertension Mother   . Stroke Mother   . Atrial fibrillation Mother   . Diabetes Mother   . Rheum arthritis Maternal Grandmother   . Coronary artery disease Maternal Grandmother   . Hypertension Maternal Grandmother   . Ulcerative colitis Father   . Cancer Neg Hx      History  Drug Use No    History  Alcohol Use No    Comment: rare    History  Smoking Status  . Former Smoker  . Packs/day: 0.50  . Years: 14.00  . Quit date: 11/28/2001  Smokeless Tobacco  . Never Used    Patient's Medications  New Prescriptions   No medications on file  Previous Medications   ASPIRIN 81 MG TABLET    Take 81 mg by mouth daily.   CHOLECALCIFEROL (VITAMIN D3) 5000 UNITS TABS    Take 1 tablet by mouth daily.   CICLOPIROX (LOPROX) 0.77 % CREAM    Apply topically 2 (two) times daily.   CYANOCOBALAMIN 500 MCG TABLET    Take 500 mcg by mouth daily.   FAMCICLOVIR (FAMVIR) 500 MG TABLET    Take 1 tablet (500 mg total) by mouth 3 (three) times daily.   FLUTICASONE (FLONASE) 50 MCG/ACT NASAL SPRAY    Place into both nostrils as needed.    MAGNESIUM OXIDE (MAG-OX) 400 MG TABLET    400  mg every other day.   MONTELUKAST (SINGULAIR) 10 MG TABLET    Take 1 tablet (10 mg total) by mouth at bedtime. SCHEDULE FOLLOW UP OFFICE VISIT FOR August 2017   MULTIPLE VITAMINS-MINERALS (MULTIVITAMIN PO)    Take by mouth daily.   OLMESARTAN-HYDROCHLOROTHIAZIDE (BENICAR HCT) 40-25 MG TABLET    Take 1 tablet by mouth daily. SCHEDULE FOLLOW UP OFFICE VISIT FOR August 2017   OMEGA-3 FATTY ACIDS (FISH OIL) 1000 MG CAPS    Take 100 mg by mouth daily.  Modified Medications   No medications on file  Discontinued Medications   B COMPLEX-C (SUPER B COMPLEX PO)    Take by mouth daily.   ESOMEPRAZOLE (NEXIUM) 20 MG PACKET  Take 20 mg by mouth daily before breakfast.    Allergies: Red yeast rice [cholestin]  Review of Systems  Constitutional: Negative.  Negative for chills, diaphoresis, fever, malaise/fatigue and weight loss.  HENT: Negative.  Negative for congestion, sore throat and tinnitus.   Eyes: Negative.  Negative for blurred vision, double vision and photophobia.  Respiratory: Negative.  Negative for cough and wheezing.   Cardiovascular: Negative.  Negative for chest pain and palpitations.  Gastrointestinal: Negative.  Negative for blood in stool, diarrhea, nausea and vomiting.  Genitourinary: Negative.  Negative for dysuria, frequency and urgency.  Musculoskeletal: Negative.  Negative for joint pain and myalgias.  Skin: Negative.  Negative for itching and rash.  Neurological: Negative.  Negative for dizziness, focal weakness, weakness and headaches.  Endo/Heme/Allergies: Negative.  Negative for environmental allergies and polydipsia. Does not bruise/bleed easily.  Psychiatric/Behavioral: Negative.  Negative for depression and memory loss. The patient is not nervous/anxious and does not have insomnia.     Objective:   Blood pressure 112/81, pulse 81, height 5' 4.25" (1.632 m), weight 257 lb 6.4 oz (116.8 kg), last menstrual period 08/18/2016. Body mass index is 43.84 kg/m. General:  Well Developed, well nourished, and in no acute distress.  Neuro: Alert and oriented x3, extra-ocular muscles intact, sensation grossly intact.  HEENT: Normocephalic, atraumatic, pupils equal round reactive to light, neck supple Skin: no gross suspicious lesions or rashes  Cardiac: Regular rate and rhythm, no murmurs rubs or gallops.  Respiratory: Essentially clear to auscultation bilaterally. Not using accessory muscles, speaking in full sentences.  Abdominal: Soft, not grossly distended Musculoskeletal: Ambulates w/o diff, FROM * 4 ext.  Vasc: less 2 sec cap RF, warm and pink  Psych:  No HI/SI, judgement and insight good, Euthymic mood. Full Affect.

## 2016-09-07 ENCOUNTER — Other Ambulatory Visit: Payer: Self-pay

## 2016-09-07 DIAGNOSIS — R5383 Other fatigue: Secondary | ICD-10-CM

## 2016-09-07 DIAGNOSIS — Z130 Encounter for screening for diseases of the blood and blood-forming organs and certain disorders involving the immune mechanism: Secondary | ICD-10-CM

## 2016-09-07 DIAGNOSIS — E7800 Pure hypercholesterolemia, unspecified: Secondary | ICD-10-CM

## 2016-09-07 DIAGNOSIS — Z131 Encounter for screening for diabetes mellitus: Secondary | ICD-10-CM

## 2016-09-18 DIAGNOSIS — J309 Allergic rhinitis, unspecified: Secondary | ICD-10-CM | POA: Insufficient documentation

## 2016-09-18 NOTE — Assessment & Plan Note (Signed)
Fish oil daily, diet controlled  Will check near future with fasting blood work

## 2016-09-18 NOTE — Assessment & Plan Note (Addendum)
Explained to patient what BMI refers to, and what it means medically.    Told patient to think about it as a "medical risk stratification measurement" and how increasing BMI is associated with increasing risk/ or worsening state of various diseases such as hypertension, hyperlipidemia, diabetes, premature OA, depression etc.  American Heart Association guidelines for healthy diet, basically Mediterranean diet, and exercise guidelines of 30 minutes 5 days per week or more discussed in detail.  Please use the lose it app or my fitness [place] to track all your foods. Don't purposely try to eat healthy or anything, just track everything you put in your mouth. We can discuss this at follow-up one we are reviewing your labs.  We briefly discussed phentermine usage and other weight loss drugs.   Health counseling performed.  All questions answered.

## 2016-09-20 ENCOUNTER — Other Ambulatory Visit: Payer: Self-pay | Admitting: Internal Medicine

## 2016-09-20 NOTE — Telephone Encounter (Signed)
Pt est care with you 09/01/16-forwarded refill request to you

## 2016-09-27 ENCOUNTER — Ambulatory Visit: Payer: Self-pay | Admitting: Family Medicine

## 2016-12-06 ENCOUNTER — Other Ambulatory Visit: Payer: Self-pay

## 2016-12-06 ENCOUNTER — Other Ambulatory Visit (INDEPENDENT_AMBULATORY_CARE_PROVIDER_SITE_OTHER): Payer: Commercial Managed Care - PPO

## 2016-12-06 DIAGNOSIS — Z130 Encounter for screening for diseases of the blood and blood-forming organs and certain disorders involving the immune mechanism: Secondary | ICD-10-CM

## 2016-12-06 DIAGNOSIS — E7800 Pure hypercholesterolemia, unspecified: Secondary | ICD-10-CM

## 2016-12-06 DIAGNOSIS — R5383 Other fatigue: Secondary | ICD-10-CM

## 2016-12-06 DIAGNOSIS — Z131 Encounter for screening for diabetes mellitus: Secondary | ICD-10-CM

## 2016-12-06 NOTE — Addendum Note (Signed)
Addended by: Narda Rutherford on: 12/06/2016 07:24 AM   Modules accepted: Orders

## 2016-12-06 NOTE — Addendum Note (Signed)
Addended by: Narda Rutherford on: 12/06/2016 07:25 AM   Modules accepted: Orders

## 2016-12-07 LAB — COMPREHENSIVE METABOLIC PANEL
ALBUMIN: 4.1 g/dL (ref 3.5–5.5)
ALK PHOS: 69 IU/L (ref 39–117)
ALT: 11 IU/L (ref 0–32)
AST: 11 IU/L (ref 0–40)
Albumin/Globulin Ratio: 1.5 (ref 1.2–2.2)
BUN / CREAT RATIO: 25 — AB (ref 9–23)
BUN: 16 mg/dL (ref 6–24)
Bilirubin Total: 0.4 mg/dL (ref 0.0–1.2)
CO2: 24 mmol/L (ref 18–29)
CREATININE: 0.64 mg/dL (ref 0.57–1.00)
Calcium: 9.3 mg/dL (ref 8.7–10.2)
Chloride: 98 mmol/L (ref 96–106)
GFR calc Af Amer: 126 mL/min/{1.73_m2} (ref >59)
GFR calc non Af Amer: 109 mL/min/{1.73_m2} (ref >59)
GLUCOSE: 91 mg/dL (ref 65–99)
Globulin, Total: 2.7 g/dL (ref 1.5–4.5)
Potassium: 3.8 mmol/L (ref 3.5–5.2)
Sodium: 140 mmol/L (ref 134–144)
Total Protein: 6.8 g/dL (ref 6.0–8.5)

## 2016-12-07 LAB — CBC WITH DIFFERENTIAL/PLATELET
BASOS: 0 %
Basophils Absolute: 0 10*3/uL (ref 0.0–0.2)
EOS (ABSOLUTE): 0.1 10*3/uL (ref 0.0–0.4)
EOS: 2 %
HEMATOCRIT: 38.3 % (ref 34.0–46.6)
Hemoglobin: 12.9 g/dL (ref 11.1–15.9)
IMMATURE GRANS (ABS): 0.1 10*3/uL (ref 0.0–0.1)
IMMATURE GRANULOCYTES: 1 %
LYMPHS: 22 %
Lymphocytes Absolute: 1.6 10*3/uL (ref 0.7–3.1)
MCH: 27 pg (ref 26.6–33.0)
MCHC: 33.7 g/dL (ref 31.5–35.7)
MCV: 80 fL (ref 79–97)
Monocytes Absolute: 0.5 10*3/uL (ref 0.1–0.9)
Monocytes: 7 %
NEUTROS PCT: 68 %
Neutrophils Absolute: 4.9 10*3/uL (ref 1.4–7.0)
Platelets: 268 10*3/uL (ref 150–379)
RBC: 4.78 x10E6/uL (ref 3.77–5.28)
RDW: 14.3 % (ref 12.3–15.4)
WBC: 7.2 10*3/uL (ref 3.4–10.8)

## 2016-12-07 LAB — TSH: TSH: 2.26 u[IU]/mL (ref 0.450–4.500)

## 2016-12-07 LAB — LIPID PANEL
CHOLESTEROL TOTAL: 194 mg/dL (ref 100–199)
Chol/HDL Ratio: 4.2 ratio units (ref 0.0–4.4)
HDL: 46 mg/dL (ref >39)
LDL Calculated: 125 mg/dL — ABNORMAL HIGH (ref 0–99)
TRIGLYCERIDES: 115 mg/dL (ref 0–149)
VLDL Cholesterol Cal: 23 mg/dL (ref 5–40)

## 2016-12-07 LAB — HEMOGLOBIN A1C
Est. average glucose Bld gHb Est-mCnc: 100 mg/dL
Hgb A1c MFr Bld: 5.1 % (ref 4.8–5.6)

## 2016-12-07 LAB — VITAMIN D 25 HYDROXY (VIT D DEFICIENCY, FRACTURES): Vit D, 25-Hydroxy: 67.4 ng/mL (ref 30.0–100.0)

## 2016-12-20 ENCOUNTER — Encounter: Payer: Self-pay | Admitting: Family Medicine

## 2016-12-20 ENCOUNTER — Other Ambulatory Visit: Payer: Self-pay

## 2016-12-20 MED ORDER — MONTELUKAST SODIUM 10 MG PO TABS: 10.0000 mg | ORAL_TABLET | Freq: Every day | ORAL | 0 refills | Status: DC

## 2016-12-20 MED ORDER — OLMESARTAN MEDOXOMIL-HCTZ 40-25 MG PO TABS: 1.0000 | ORAL_TABLET | Freq: Every day | ORAL | 0 refills | Status: DC

## 2017-02-17 ENCOUNTER — Other Ambulatory Visit: Payer: Self-pay

## 2017-02-17 ENCOUNTER — Encounter: Payer: Self-pay | Admitting: Family Medicine

## 2017-02-17 DIAGNOSIS — R002 Palpitations: Secondary | ICD-10-CM

## 2017-02-17 NOTE — Progress Notes (Signed)
Received MyChart message from patient requesting referral to cardiology, Dr. Burt Knack, for palpitations. Pt stated that she has seen Dr. Burt Knack approximately 3 years ago.  Referral placed per standing orders.  Pt notified by MyChart.  Charyl Bigger, CMA

## 2017-02-23 ENCOUNTER — Ambulatory Visit: Payer: Self-pay | Admitting: Family Medicine

## 2017-03-14 ENCOUNTER — Telehealth: Payer: Self-pay | Admitting: Family Medicine

## 2017-03-14 MED ORDER — OLMESARTAN MEDOXOMIL-HCTZ 40-25 MG PO TABS: 1.0000 | ORAL_TABLET | Freq: Every day | ORAL | 0 refills | Status: DC

## 2017-03-14 MED ORDER — MONTELUKAST SODIUM 10 MG PO TABS: 10.0000 mg | ORAL_TABLET | Freq: Every day | ORAL | 0 refills | Status: DC

## 2017-03-14 NOTE — Telephone Encounter (Signed)
Pt has appt 04/17/17.  Charyl Bigger, CMA

## 2017-03-14 NOTE — Telephone Encounter (Signed)
Pt called to reschedule March OV w/ Dr. Jenetta Downer--   Patient also states is getting low on Rx for 2 medications & needs refills in about 2 weeks call to [facility]) for:   1)--- Montelukast (Singulair) 10 MG tablets (takes 1 once at bedtime).  2)--- olmesartan-hydrochlorothazide ( BENICAR HCT) 40-25 MG tablets (take 1 table daily).   --glh

## 2017-03-15 ENCOUNTER — Ambulatory Visit: Payer: Commercial Managed Care - PPO | Admitting: Cardiovascular Disease

## 2017-03-28 DIAGNOSIS — Z01419 Encounter for gynecological examination (general) (routine) without abnormal findings: Secondary | ICD-10-CM | POA: Diagnosis not present

## 2017-03-28 DIAGNOSIS — Z1231 Encounter for screening mammogram for malignant neoplasm of breast: Secondary | ICD-10-CM | POA: Diagnosis not present

## 2017-03-30 ENCOUNTER — Encounter: Payer: Self-pay | Admitting: Anesthesiology

## 2017-03-30 ENCOUNTER — Other Ambulatory Visit: Payer: Self-pay | Admitting: Obstetrics and Gynecology

## 2017-03-30 DIAGNOSIS — R928 Other abnormal and inconclusive findings on diagnostic imaging of breast: Secondary | ICD-10-CM

## 2017-04-03 ENCOUNTER — Ambulatory Visit
Admission: RE | Admit: 2017-04-03 | Discharge: 2017-04-03 | Disposition: A | Discharge status: Home / Self Care | Payer: Commercial Managed Care - PPO | Source: Ambulatory Visit | Attending: Obstetrics and Gynecology | Admitting: Obstetrics and Gynecology

## 2017-04-03 ENCOUNTER — Other Ambulatory Visit: Payer: Self-pay | Admitting: Obstetrics and Gynecology

## 2017-04-03 DIAGNOSIS — R928 Other abnormal and inconclusive findings on diagnostic imaging of breast: Secondary | ICD-10-CM

## 2017-04-03 DIAGNOSIS — N6489 Other specified disorders of breast: Secondary | ICD-10-CM

## 2017-04-04 ENCOUNTER — Ambulatory Visit: Payer: Commercial Managed Care - PPO | Admitting: Cardiovascular Disease

## 2017-04-12 DIAGNOSIS — R319 Hematuria, unspecified: Secondary | ICD-10-CM | POA: Diagnosis not present

## 2017-04-17 ENCOUNTER — Ambulatory Visit (INDEPENDENT_AMBULATORY_CARE_PROVIDER_SITE_OTHER): Payer: Commercial Managed Care - PPO | Admitting: Family Medicine

## 2017-04-17 ENCOUNTER — Encounter: Payer: Self-pay | Admitting: Family Medicine

## 2017-04-17 VITALS — BP 100/71 | HR 77 | Ht 64.25 in | Wt 261.4 lb

## 2017-04-17 DIAGNOSIS — Z6841 Body Mass Index (BMI) 40.0 and over, adult: Secondary | ICD-10-CM | POA: Diagnosis not present

## 2017-04-17 DIAGNOSIS — I10 Essential (primary) hypertension: Secondary | ICD-10-CM | POA: Diagnosis not present

## 2017-04-17 DIAGNOSIS — E785 Hyperlipidemia, unspecified: Secondary | ICD-10-CM | POA: Diagnosis not present

## 2017-04-17 DIAGNOSIS — E559 Vitamin D deficiency, unspecified: Secondary | ICD-10-CM

## 2017-04-17 DIAGNOSIS — K219 Gastro-esophageal reflux disease without esophagitis: Secondary | ICD-10-CM

## 2017-04-17 DIAGNOSIS — Z9109 Other allergy status, other than to drugs and biological substances: Secondary | ICD-10-CM | POA: Diagnosis not present

## 2017-04-17 MED ORDER — PHENTERMINE HCL 37.5 MG PO TABS: 37.5000 mg | ORAL_TABLET | Freq: Every day | ORAL | 2 refills | Status: DC

## 2017-04-17 MED ORDER — MONTELUKAST SODIUM 10 MG PO TABS: 10.0000 mg | ORAL_TABLET | Freq: Every day | ORAL | 3 refills | Status: DC

## 2017-04-17 MED ORDER — OLMESARTAN MEDOXOMIL-HCTZ 40-25 MG PO TABS: 1.0000 | ORAL_TABLET | Freq: Every day | ORAL | 3 refills | Status: DC

## 2017-04-17 NOTE — Assessment & Plan Note (Signed)
Last checked in January.  LDL under 130  Diet and exercise discussed with patient.  Reck 1 yr from prior

## 2017-04-17 NOTE — Progress Notes (Addendum)
Impression and Recommendations:    1. Benign essential hypertension   2. Hyperlipidemia, unspecified hyperlipidemia type   3. Environmental allergies   4. Gastroesophageal reflux disease without esophagitis   5. Vitamin D deficiency   6. BMI 40.0-44.9, adult ([name])     Goals for next OV- 13 lb wt loss goal; walk 16min 5d/wk   --> in addition to [employer]- pt wants to come here for check ins and to keep her accountable  Benign essential hypertension Hypertension ever since second pregnancy.  Cont meds  Well controlled  Cont to monitor at home esp with starting phenteramine-- d/c pt risks  Diet/ exercise/ low salt d/c pt  Environmental allergies All Rhin/ All Sin:   - cont Singulair   Hyperlipidemia Last checked in January.  LDL under 130  Diet and exercise discussed with patient.  Reck 1 yr from prior  Vitamin D deficiency In 60's.  Pt remains on 5k IU Vit D3 per day.   GERD (gastroesophageal reflux disease) Only has symptoms when she eats salsa or spicy foods.  She avoids her triggers.  Symptoms well-controlled.  BMI 40.0-44.9, adult [facility]) Counseling done- advised wt loss.  Use [employer] App---> track all food and drinks.   Phentermine started today  F/up 1 mo with 13 lb wt loss goal  Pt was in the office today for 40+ minutes, with over 50% time spent in face to face counseling of patients obesity, treatment plans of it including medicine management and lifestyle modification, strategies to improve health and well being- stress mgt and avoid stress eating and late night eating; and in coordination of care.   New Prescriptions   PHENTERMINE (ADIPEX-P) 37.5 MG TABLET    Take 1 tablet (37.5 mg total) by mouth daily.    Modified Medications   Modified Medication Previous Medication   MONTELUKAST (SINGULAIR) 10 MG TABLET montelukast (SINGULAIR) 10 MG tablet      Take 1 tablet (10 mg total) by mouth at bedtime.    Take 1 tablet (10 mg total) by mouth at bedtime.     OLMESARTAN-HYDROCHLOROTHIAZIDE (BENICAR HCT) 40-25 MG TABLET olmesartan-hydrochlorothiazide (BENICAR HCT) 40-25 MG tablet      Take 1 tablet by mouth daily.    Take 1 tablet by mouth daily.    Return in about 4 weeks (around 05/15/2017) for wt mgt OV.  13 lb wt loss goal; walk 22min 5d/wk  The patient was counseled, risk factors were discussed, anticipatory guidance given.   Please see AVS handed out to patient at the end of our visit for further patient instructions/ counseling done pertaining to today's office visit.    Note: This document was prepared using Dragon voice recognition software and may include unintentional dictation errors.  ----------------------------------------------------------------------------------------------------------------------    Subjective:    Chief Complaint  Patient presents with  . Hyperlipidemia  . Hypertension    HPI: Gina Farrell is a pleasant 46 y.o. female who presents to [place] at [facility] today to review BP, chronic med issues.   1) BP:  No problems- tol meds, no issues  2) WT:  Wt up 4 lbs from Oct.   Living with in-laws now--> building hone- set to move in July.  Exercise--> walking 3 times/wlk 30 min.   Signed up for [employer] 2 wks ago.   Doesn't want to take meds unless she has to.   Portion control and sweets are her weaknesses.     3)  recently had exam thru [facility]- UTD on Mammo, GYN PAP etc.   _______________________________________________________________________________________  From last OV:  I asked the patient to review their chronic problem list with me to ensure everything was updated and accurate.     Patient is married 11 years to [employer] who is the [employer], 3 kids ages 23, 7, 70. Works as Solicitor for [employer]  Patient with 7-pack-year history of smoking and quit in 2003.  Walks\dances one hour 2-3 times a week.  Family history is negative for diabetes and  stroke in her mother  Hypertension:  Had preeclampsia with her first pregnancy. And ever since her second pregnancy, 17 years ago, she's been on anti--hypertensive medicines.  Stable, well controlled always under 140/90 regularly.  Obesity:  Struggled most of her life. Various diets, always been difficult to keep it off.    Wt Readings from Last 3 Encounters:  04/17/17 261 lb 6.4 oz (118.6 kg)  09/01/16 257 lb 6.4 oz (116.8 kg)  08/04/16 257 lb (116.6 kg)   BP Readings from Last 3 Encounters:  04/17/17 100/71  09/01/16 112/81  08/04/16 120/84   Pulse Readings from Last 3 Encounters:  04/17/17 77  09/01/16 81  08/04/16 76   BMI Readings from Last 3 Encounters:  04/17/17 44.52 kg/m  09/01/16 43.84 kg/m  08/04/16 44.11 kg/m    Patient Active Problem List   Diagnosis Date Noted  . BMI 40.0-44.9, adult ([place]) 04/17/2017    Priority: High  . Benign essential hypertension 02/27/2012    Priority: High  . Hypertriglyceridemia 09/01/2016    Priority: Medium  . Hyperlipidemia     Priority: Medium  . GERD (gastroesophageal reflux disease) 01/13/2015    Priority: Low  . Environmental allergies     Priority: Low  . Vitamin D deficiency     Priority: Low  . Allergic rhinitis 09/18/2016  . H/O tinea corporis 09/01/2016  . Hypomagnesemia 09/01/2016     Past Medical History:  Diagnosis Date  . Allergic rhinitis   . Chicken pox   . GERD (gastroesophageal reflux disease)   . Hypertension   . Miscarriage 1998/2008  . Vitamin D deficiency   . Vitamin D deficiency      Past Surgical History:  Procedure Laterality Date  . CESAREAN SECTION     x 2  . d and c    . TUBAL LIGATION       Family History  Problem Relation Age of Onset  . Hypertension Mother   . Stroke Mother   . Atrial fibrillation Mother   . Diabetes Mother   . Rheum arthritis Maternal Grandmother   . Coronary artery disease Maternal Grandmother   . Hypertension Maternal Grandmother   . Ulcerative  colitis Father   . Cancer Neg Hx      History  Drug Use No    History  Alcohol Use No    Comment: rare    History  Smoking Status  . Former Smoker  . Packs/day: 0.50  . Years: 14.00  . Quit date: 11/28/2001  Smokeless Tobacco  . Never Used    Patient's Medications  New Prescriptions   PHENTERMINE (ADIPEX-P) 37.5 MG TABLET    Take 1 tablet (37.5 mg total) by mouth daily.  Previous Medications   ASPIRIN 81 MG TABLET    Take 81 mg by mouth daily.   CHOLECALCIFEROL (VITAMIN D3) 5000 UNITS TABS    Take 1 tablet by mouth daily.  CICLOPIROX (LOPROX) 0.77 % CREAM    Apply topically 2 (two) times daily.   CYANOCOBALAMIN 500 MCG TABLET    Take 500 mcg by mouth daily.   FAMCICLOVIR (FAMVIR) 500 MG TABLET    Take 1 tablet (500 mg total) by mouth 3 (three) times daily.   FLUTICASONE (FLONASE) 50 MCG/ACT NASAL SPRAY    Place into both nostrils as needed.    MULTIPLE VITAMINS-MINERALS (MULTIVITAMIN PO)    Take by mouth daily.   OMEGA-3 FATTY ACIDS (FISH OIL) 1000 MG CAPS    Take 100 mg by mouth daily.  Modified Medications   Modified Medication Previous Medication   MONTELUKAST (SINGULAIR) 10 MG TABLET montelukast (SINGULAIR) 10 MG tablet      Take 1 tablet (10 mg total) by mouth at bedtime.    Take 1 tablet (10 mg total) by mouth at bedtime.   OLMESARTAN-HYDROCHLOROTHIAZIDE (BENICAR HCT) 40-25 MG TABLET olmesartan-hydrochlorothiazide (BENICAR HCT) 40-25 MG tablet      Take 1 tablet by mouth daily.    Take 1 tablet by mouth daily.  Discontinued Medications   MAGNESIUM OXIDE (MAG-OX) 400 MG TABLET    400 mg every other day.    Allergies: Red yeast rice [cholestin]  Review of Systems  Constitutional: Negative.  Negative for chills, diaphoresis, fever, malaise/fatigue and weight loss.  HENT: Negative.  Negative for congestion, sore throat and tinnitus.   Eyes: Negative.  Negative for blurred vision, double vision and photophobia.  Respiratory: Negative.  Negative for cough and  wheezing.   Cardiovascular: Negative.  Negative for chest pain and palpitations.  Gastrointestinal: Negative.  Negative for blood in stool, diarrhea, nausea and vomiting.  Genitourinary: Negative.  Negative for dysuria, frequency and urgency.  Musculoskeletal: Negative.  Negative for joint pain and myalgias.  Skin: Negative.  Negative for itching and rash.  Neurological: Negative.  Negative for dizziness, focal weakness, weakness and headaches.  Endo/Heme/Allergies: Negative.  Negative for environmental allergies and polydipsia. Does not bruise/bleed easily.  Psychiatric/Behavioral: Negative.  Negative for depression and memory loss. The patient is not nervous/anxious and does not have insomnia.     Objective:   Blood pressure 100/71, pulse 77, height 5' 4.25" (1.632 m), weight 261 lb 6.4 oz (118.6 kg), last menstrual period 03/31/2017. Body mass index is 44.52 kg/m. General: Well Developed, well nourished, and in no acute distress.  Neuro: Alert and oriented x3, extra-ocular muscles intact, sensation grossly intact.  HEENT: Normocephalic, atraumatic, pupils equal round reactive to light, neck supple Skin: no gross suspicious lesions or rashes  Cardiac: Regular rate and rhythm, no murmurs rubs or gallops.  Respiratory: Essentially clear to auscultation bilaterally. Not using accessory muscles, speaking in full sentences.  Abdominal: Soft, not grossly distended Musculoskeletal: Ambulates w/o diff, FROM * 4 ext.  Vasc: less 2 sec cap RF, warm and pink  Psych:  No HI/SI, judgement and insight good, Euthymic mood. Full Affect.

## 2017-04-17 NOTE — Assessment & Plan Note (Signed)
All Rhin/ All Sin:   - cont Singulair

## 2017-04-17 NOTE — Assessment & Plan Note (Signed)
Counseling done- advised wt loss.  Use [employer] App---> track all food and drinks.   Phentermine started today  F/up 1 mo with 13 lb wt loss goal

## 2017-04-17 NOTE — Assessment & Plan Note (Signed)
Only has symptoms when she eats salsa or spicy foods.  She avoids her triggers.  Symptoms well-controlled.

## 2017-04-17 NOTE — Patient Instructions (Addendum)
5 % wt loss goal --> 13 lbs in 4 wks.   June 18th --> Goal is to walk 4min 5d/wk    Phentermine 1/2 tab daily--> depending on how you feel    Phentermine tablets or capsules What is this medicine? PHENTERMINE (FEN ter meen) decreases your appetite. It is used with a reduced calorie diet and exercise to help you lose weight. This medicine may be used for other purposes; ask your health care provider or pharmacist if you have questions. COMMON BRAND NAME(S): Adipex-P, Atti-Plex P, Atti-Plex P Spansule, Fastin, Lomaira, Pro-Fast, Tara-8 What should I tell my health care provider before I take this medicine? They need to know if you have any of these conditions: -agitation -glaucoma -heart disease -high blood pressure -history of substance abuse -lung disease called Primary Pulmonary Hypertension (PPH) -taken an MAOI like Carbex, Eldepryl, Marplan, Nardil, or Parnate in last 14 days -thyroid disease -an unusual or allergic reaction to phentermine, other medicines, foods, dyes, or preservatives -pregnant or trying to get pregnant -breast-feeding How should I use this medicine? Take this medicine by mouth with a glass of water. Follow the directions on the prescription label. The instructions for use may differ based on the product and dose you are taking. Avoid taking this medicine in the evening. It may interfere with sleep. Take your doses at regular intervals. Do not take your medicine more often than directed. Talk to your pediatrician regarding the use of this medicine in children. While this drug may be prescribed for children 17 years or older for selected conditions, precautions do apply. Overdosage: If you think you have taken too much of this medicine contact a poison control center or emergency room at once. NOTE: This medicine is only for you. Do not share this medicine with others. What if I miss a dose? If you miss a dose, take it as soon as you can. If it is almost time  for your next dose, take only that dose. Do not take double or extra doses. What may interact with this medicine? Do not take this medicine with any of the following medications: -duloxetine -MAOIs like Carbex, Eldepryl, Marplan, Nardil, and Parnate -medicines for colds or breathing difficulties like pseudoephedrine or phenylephrine -procarbazine -sibutramine -SSRIs like citalopram, escitalopram, fluoxetine, fluvoxamine, paroxetine, and sertraline -stimulants like dexmethylphenidate, methylphenidate or modafinil -venlafaxine This medicine may also interact with the following medications: -medicines for diabetes This list may not describe all possible interactions. Give your health care provider a list of all the medicines, herbs, non-prescription drugs, or dietary supplements you use. Also tell them if you smoke, drink alcohol, or use illegal drugs. Some items may interact with your medicine. What should I watch for while using this medicine? Notify your physician immediately if you become short of breath while doing your normal activities. Do not take this medicine within 6 hours of bedtime. It can keep you from getting to sleep. Avoid drinks that contain caffeine and try to stick to a regular bedtime every night. This medicine was intended to be used in addition to a healthy diet and exercise. The best results are achieved this way. This medicine is only indicated for short-term use. Eventually your weight loss may level out. At that point, the drug will only help you maintain your new weight. Do not increase or in any way change your dose without consulting your doctor. You may get drowsy or dizzy. Do not drive, use machinery, or do anything that needs mental alertness until you  know how this medicine affects you. Do not stand or sit up quickly, especially if you are an older patient. This reduces the risk of dizzy or fainting spells. Alcohol may increase dizziness and drowsiness. Avoid  alcoholic drinks. What side effects may I notice from receiving this medicine? Side effects that you should report to your doctor or health care professional as soon as possible: -chest pain, palpitations -depression or severe changes in mood -increased blood pressure -irritability -nervousness or restlessness -severe dizziness -shortness of breath -problems urinating -unusual swelling of the legs -vomiting Side effects that usually do not require medical attention (report to your doctor or health care professional if they continue or are bothersome): -blurred vision or other eye problems -changes in sexual ability or desire -constipation or diarrhea -difficulty sleeping -dry mouth or unpleasant taste -headache -nausea This list may not describe all possible side effects. Call your doctor for medical advice about side effects. You may report side effects to FDA at 1-800-FDA-1088. Where should I keep my medicine? Keep out of the reach of children. This medicine can be abused. Keep your medicine in a safe place to protect it from theft. Do not share this medicine with anyone. Selling or giving away this medicine is dangerous and against the law. This medicine may cause accidental overdose and death if taken by other adults, children, or pets. Mix any unused medicine with a substance like cat litter or coffee grounds. Then throw the medicine away in a sealed container like a sealed bag or a coffee can with a lid. Do not use the medicine after the expiration date. Store at room temperature between 20 and 25 degrees C (68 and 77 degrees F). Keep container tightly closed. NOTE: This sheet is a summary. It may not cover all possible information. If you have questions about this medicine, talk to your doctor, pharmacist, or health care provider.  2018 Elsevier/Gold Standard (2015-08-21 12:53:15)

## 2017-04-17 NOTE — Assessment & Plan Note (Signed)
Hypertension ever since second pregnancy.  Cont meds  Well controlled  Cont to monitor at home esp with starting phenteramine-- d/c pt risks  Diet/ exercise/ low salt d/c pt

## 2017-04-17 NOTE — Assessment & Plan Note (Signed)
In 60's.  Pt remains on 5k IU Vit D3 per day.

## 2017-05-15 DIAGNOSIS — H524 Presbyopia: Secondary | ICD-10-CM | POA: Diagnosis not present

## 2017-05-15 DIAGNOSIS — H52223 Regular astigmatism, bilateral: Secondary | ICD-10-CM | POA: Diagnosis not present

## 2017-05-15 DIAGNOSIS — H5203 Hypermetropia, bilateral: Secondary | ICD-10-CM | POA: Diagnosis not present

## 2017-05-18 ENCOUNTER — Ambulatory Visit: Payer: Self-pay | Admitting: Family Medicine

## 2017-07-12 DIAGNOSIS — N644 Mastodynia: Secondary | ICD-10-CM | POA: Diagnosis not present

## 2017-10-05 ENCOUNTER — Other Ambulatory Visit: Payer: Self-pay

## 2017-10-06 ENCOUNTER — Encounter: Payer: Self-pay | Admitting: Family Medicine

## 2017-10-06 ENCOUNTER — Ambulatory Visit: Payer: Commercial Managed Care - PPO | Admitting: Family Medicine

## 2017-10-06 VITALS — BP 137/85 | HR 96 | Ht 64.25 in | Wt 259.7 lb

## 2017-10-06 DIAGNOSIS — B9689 Other specified bacterial agents as the cause of diseases classified elsewhere: Secondary | ICD-10-CM

## 2017-10-06 DIAGNOSIS — J310 Chronic rhinitis: Secondary | ICD-10-CM

## 2017-10-06 DIAGNOSIS — J329 Chronic sinusitis, unspecified: Secondary | ICD-10-CM

## 2017-10-06 DIAGNOSIS — R059 Cough, unspecified: Secondary | ICD-10-CM

## 2017-10-06 DIAGNOSIS — H938X3 Other specified disorders of ear, bilateral: Secondary | ICD-10-CM

## 2017-10-06 DIAGNOSIS — J208 Acute bronchitis due to other specified organisms: Secondary | ICD-10-CM

## 2017-10-06 DIAGNOSIS — R05 Cough: Secondary | ICD-10-CM

## 2017-10-06 MED ORDER — PREDNISONE 20 MG PO TABS: ORAL_TABLET | ORAL | 0 refills | Status: DC

## 2017-10-06 MED ORDER — HYDROCOD POLST-CPM POLST ER 10-8 MG/5ML PO SUER: 5.0000 mL | Freq: Two times a day (BID) | ORAL | 0 refills | Status: DC | PRN

## 2017-10-06 MED ORDER — AMOXICILLIN-POT CLAVULANATE 875-125 MG PO TABS: 1.0000 | ORAL_TABLET | Freq: Two times a day (BID) | ORAL | 0 refills | Status: AC

## 2017-10-06 NOTE — Progress Notes (Signed)
    Acute Care Office visit  Assessment and plan:  1. Acute bacterial bronchitis-symptoms greater than 14 days and worse   2. Rhinosinusitis   3. Congestion of both ears   4. Cough- prod    -Since symptoms greater than 12-14 days and now worse, will treat with antibiotics-Augmentin, prednisone taper, and Tussionex as needed for cough at night.    Supportive care discussed with patient.  All questions were answered about over-the-counter and prescription medications  Patient is flying on an airplane in 6-7 days and I cautioned her about head colds and bronchitis is along with flying  Education and routine counseling performed. Handouts provided.  Anticipatory guidance and routine counseling done re: condition, txmnt options and need for follow up. All questions of patient's were answered.    Gross side effects, risk and benefits, and alternatives of medications discussed with patient.  Patient is aware that all medications have potential side effects and we are unable to predict every sideeffect or drug-drug interaction that may occur.  Expresses verbal understanding and consents to current therapy plan and treatment regiment.  Return if symptoms worsen or fail to improve.  Please see AVS handed out to patient at the end of our visit for additional patient instructions/ counseling done pertaining to today's office visit.  Note:  This document was prepared using Dragon voice recognition software and may include unintentional dictation errors.    Subjective:    Chief Complaint  Patient presents with   Cough    HPI:  Pt presents with Sx for    C/o:   Denies:  objective F/C,   No face pain or ear pain,   No N/V/D,   No SOB/DIB, no Cough or  Pleuritic CP,  No Rash.    For symptoms patient has tried:    Overall getting:     No problems updated.   Past medical history, Surgical history, Family history reviewed and noted below, Social history, Allergies, and Medications  have been entered into the medical record, reviewed and changed as needed.   Allergies  Allergen Reactions   Red Yeast Rice [Cholestin]     Cramps/Heartburn    Review of Systems: General:   No F/C, wt loss Pulm:   No DIB, pleuritic chest pain Card:  No CP, palpitations Abd:  No n/v/d or pain Ext:  No inc edema from baseline   Objective:   Blood pressure 137/85, pulse 96, height 5' 4.25" (1.632 m), weight 259 lb 11.2 oz (117.8 kg). Body mass index is 44.23 kg/m. General: Well Developed, well nourished, appropriate for stated age.  Neuro: Alert and oriented x3, extra-ocular muscles intact, sensation grossly intact.  HEENT: Normocephalic, atraumatic, pupils equal round reactive to light, neck supple, no masses, no painful lymphadenopathy, TM's intact B/L, no acute findings. Nares- patent, clear d/c, OP- clear, mild erythema, No TTP sinuses Skin: Warm and dry, no gross rash. Cardiac: RRR, S1 S2,  no murmurs rubs or gallops.  Respiratory: ECTA B/L and A/P, Not using accessory muscles, speaking in full sentences- unlabored. Vascular:  No gross lower ext edema, cap RF less 2 sec. Psych: No HI/SI, judgement and insight good, Euthymic mood. Full Affect.   Patient Care Team    Relationship Specialty Notifications Start End  Thomasene Lot, DO PCP - General Family Medicine  09/01/16   Richardean Chimera, MD Consulting Physician Obstetrics and Gynecology  09/01/16

## 2017-10-11 ENCOUNTER — Ambulatory Visit
Admission: RE | Admit: 2017-10-11 | Discharge: 2017-10-11 | Disposition: A | Discharge status: Home / Self Care | Payer: Commercial Managed Care - PPO | Source: Ambulatory Visit | Attending: Obstetrics and Gynecology | Admitting: Obstetrics and Gynecology

## 2017-10-11 ENCOUNTER — Other Ambulatory Visit: Payer: Self-pay | Admitting: Obstetrics and Gynecology

## 2017-10-11 ENCOUNTER — Ambulatory Visit: Payer: Self-pay

## 2017-10-11 DIAGNOSIS — N6489 Other specified disorders of breast: Secondary | ICD-10-CM

## 2017-10-11 DIAGNOSIS — R928 Other abnormal and inconclusive findings on diagnostic imaging of breast: Secondary | ICD-10-CM | POA: Diagnosis not present

## 2017-12-20 ENCOUNTER — Encounter: Payer: Self-pay | Admitting: Family Medicine

## 2017-12-20 ENCOUNTER — Ambulatory Visit (INDEPENDENT_AMBULATORY_CARE_PROVIDER_SITE_OTHER): Payer: Commercial Managed Care - PPO | Admitting: Family Medicine

## 2017-12-20 VITALS — BP 126/84 | HR 86 | Ht 64.25 in | Wt 258.9 lb

## 2017-12-20 DIAGNOSIS — Z0000 Encounter for general adult medical examination without abnormal findings: Secondary | ICD-10-CM | POA: Diagnosis not present

## 2017-12-20 DIAGNOSIS — E781 Pure hyperglyceridemia: Secondary | ICD-10-CM

## 2017-12-20 DIAGNOSIS — E785 Hyperlipidemia, unspecified: Secondary | ICD-10-CM

## 2017-12-20 DIAGNOSIS — Z6841 Body Mass Index (BMI) 40.0 and over, adult: Secondary | ICD-10-CM | POA: Diagnosis not present

## 2017-12-20 DIAGNOSIS — E559 Vitamin D deficiency, unspecified: Secondary | ICD-10-CM | POA: Diagnosis not present

## 2017-12-20 DIAGNOSIS — R3121 Asymptomatic microscopic hematuria: Secondary | ICD-10-CM | POA: Insufficient documentation

## 2017-12-20 DIAGNOSIS — I10 Essential (primary) hypertension: Secondary | ICD-10-CM | POA: Diagnosis not present

## 2017-12-20 NOTE — Progress Notes (Signed)
Impression and Recommendations:    1. Encounter for wellness examination   2. h/o Asymptomatic microscopic hematuria- in GYN office   3. Benign essential hypertension   4. BMI 40.0-44.9, adult ([place])   5. Hyperlipidemia, unspecified hyperlipidemia type   6. Hypertriglyceridemia   7. Vitamin D deficiency   8. Hypomagnesemia      1) Anticipatory Guidance: Discussed importance of wearing a seatbelt while driving, not texting while driving; sunscreen when outside along with yearly skin surveillance; eating a well balanced and modest diet; physical activity at least 25 minutes per day or 150 min/ week of moderate to intense activity.  2) Immunizations / Screenings / Labs:  All immunizations and screenings that patient agrees to, are up-to-date per recommendations or will be updated today.  Patient understands the needs for q 70mo dental and yearly vision screens which pt will schedule independently. Obtain CBC, CMP, HgA1c, Lipid panel, TSH and vit D when fasting if not already done recently.   3) Weight:   Discussed goal of losing even 5-10% of current body weight which would improve overall feelings of well being and improve objective health data significantly.   Improve nutrient density of diet through increasing intake of fruits and vegetables and decreasing saturated/trans fats, white flour products and refined sugar products.   4) Asymptomatic microscopic hematuria: Pt has a h/o asymptomatic hematuria found by her OBGYN in the office when they check her urine. As she is typically spotting or coming off her period during the times of these tests at the OBGYN, pt recommended to schedule an appointment for a UA in the office 1 week after her period ends to check for microscopic blood.   5) Benign hypertension  6)HLD-  7)hyperglyceridemia  8)Vitamin D deficiency  9)hypomagnesemia  As pt is fasting today, we will order labs including: A1c, lipid profile, CMP, vitamin D, TSH, Mag,  Phos, and CBC.  Will order a urinalysis in the near future to recheck for her hematuria to see if it has resolved  Follow up in 4-6 months to discuss labs and manage chronic care.   Orders Placed This Encounter  Procedures  . Comprehensive metabolic panel  . Lipid panel  . Hemoglobin A1c  . TSH  . VITAMIN D 25 Hydroxy (Vit-D Deficiency, Fractures)  . CBC with Differential/Platelet  . Magnesium  . Phosphorus  . POCT urinalysis dipstick    Gross side effects, risk and benefits, and alternatives of medications discussed with patient.  Patient is aware that all medications have potential side effects and we are unable to predict every side effect or drug-drug interaction that may occur.  Expresses verbal understanding and consents to current therapy plan and treatment regimen.  F-up preventative CPE in 1 year. F/up sooner for chronic care management as discussed and/or prn.  Please see orders placed and AVS handed out to patient at the end of our visit for further patient instructions/ counseling done pertaining to today's office visit.  This document serves as a record of services personally performed by Mellody Dance, DO. It was created on her behalf by [name], a trained medical scribe. The creation of this record is based on the scribe's personal observations and the provider's statements to them.   I have reviewed the above medical documentation for accuracy and completeness and I concur.  Mellody Dance 12/20/17 9:14 AM   Subjective:    Chief Complaint  Patient presents with  . Annual Exam   CC: complete physical exam  HPI: Gina Farrell is a 47 y.o. female who presents to [place] at [facility] today a yearly health maintenance exam.  Health Maintenance Summary Reviewed and updated, unless pt declines services.  Colonoscopy:     Unnecessary (Unnecessary secondary to < 66 or > 59 years old.) Tobacco History Reviewed:   Y  CT scan for screening  lung CA:   NA Abdominal Ultrasound:  unnecessary   ( Unnecessary secondary to < 24 or > 29 years old) Alcohol:    No concerns, no excessive use Exercise Habits:   Not meeting AHA guidelines; she is not gaining or losing anything. STD concerns:   none Drug Use:   None Birth control method:   n/a Menses regular:     n/a Lumps or breast concerns:      no Breast Cancer Family History:      No Bone/ DEXA scan:    NA ( Unnecessary due to < 65 and average risk)   Mammogram: she receives them every 6 months. She has a gynecologist, Dr[facility] at [facility]. She found something on her L breast in May 2018, but Dr. Radene Knee said it should not be of too much concern. Her next appointment with them is May 2019. She also gets her pap smears there. She notes there is occasionally a trace amount of blood found in her urine, but she denies any symptoms. She states this is usually around the time she is on her period, but she usually spots a little bit before her period.  GI: No h/o GI cancers. She denies any symptoms.  Sexual health: not relevant to this visit, no change.   Mental health: pt denies any symptoms beyond her usual baseline.   Hearing: she denies any problems  Eye: she is UTD and goes to her eye doctor every year.  Dermatologist: She sees Dr. Louis Meckel, who is retiring, and she will be switching to a new physician some time in the near future. She goes to him every 2 years for full-body skin scans.  GU: She denies any problems urinating or hematuria.   She states she tries to get an adequate amount of fiber in her diet, as well as trying to drink her body weight in oz of water/day. She denies any other adverse symptoms like CP, abdominal pain, and SOB. Pt is fasting today and she is compliant with her medications and supplements.  Immunization History  Administered Date(s) Administered  . Influenza-Unspecified 09/01/2014  . Td 11/28/2001  . Tdap 07/30/2011    Health  Maintenance  Topic Date Due  . PAP SMEAR  12/20/2017 (Originally 09/25/2017)  . INFLUENZA VACCINE  08/22/2018 (Originally 06/28/2017)  . TETANUS/TDAP  07/29/2021  . HIV Screening  Completed     Wt Readings from Last 3 Encounters:  12/20/17 258 lb 14.4 oz (117.4 kg)  10/06/17 259 lb 11.2 oz (117.8 kg)  04/17/17 261 lb 6.4 oz (118.6 kg)   BP Readings from Last 3 Encounters:  12/20/17 126/84  10/06/17 137/85  04/17/17 100/71   Pulse Readings from Last 3 Encounters:  12/20/17 86  10/06/17 96  04/17/17 77     Past Medical History:  Diagnosis Date  . Allergic rhinitis   . Chicken pox   . GERD (gastroesophageal reflux disease)   . Hypertension   . Miscarriage 1998/2008  . Vitamin D deficiency   . Vitamin D deficiency       Past Surgical History:  Procedure Laterality  Date  . CESAREAN SECTION     x 2  . d and c    . TUBAL LIGATION        Family History  Problem Relation Age of Onset  . Hypertension Mother   . Stroke Mother   . Atrial fibrillation Mother   . Diabetes Mother   . Rheum arthritis Maternal Grandmother   . Coronary artery disease Maternal Grandmother   . Hypertension Maternal Grandmother   . Ulcerative colitis Father   . Cancer Neg Hx       Social History   Substance and Sexual Activity  Drug Use No  ,   Social History   Substance and Sexual Activity  Alcohol Use No  . Alcohol/week: 0.0 oz   Comment: rare  ,   Social History   Tobacco Use  Smoking Status Former Smoker  . Packs/day: 0.50  . Years: 14.00  . Pack years: 7.00  . Last attempt to quit: 11/28/2001  . Years since quitting: 16.0  Smokeless Tobacco Never Used  ,   Social History   Substance and Sexual Activity  Sexual Activity Yes    Current Outpatient Medications on File Prior to Visit  Medication Sig Dispense Refill  . aspirin 81 MG tablet Take 81 mg by mouth daily.    . Cholecalciferol (VITAMIN D3) 5000 units TABS Take 1 tablet by mouth daily.    . ciclopirox  (LOPROX) 0.77 % cream Apply topically 2 (two) times daily. (Patient taking differently: Apply 1 application topically as needed. ) 15 g 0  . cyanocobalamin 500 MCG tablet Take 500 mcg by mouth daily.    . famciclovir (FAMVIR) 500 MG tablet Take 1 tablet (500 mg total) by mouth 3 (three) times daily. (Patient taking differently: Take 500 mg by mouth as needed. ) 21 tablet 1  . fluticasone (FLONASE) 50 MCG/ACT nasal spray Place into both nostrils as needed.     . montelukast (SINGULAIR) 10 MG tablet Take 1 tablet (10 mg total) by mouth at bedtime. 90 tablet 3  . Multiple Vitamins-Minerals (MULTIVITAMIN PO) Take by mouth daily.    Marland Kitchen olmesartan-hydrochlorothiazide (BENICAR HCT) 40-25 MG tablet Take 1 tablet by mouth daily. 90 tablet 3  . Omega-3 Fatty Acids (FISH OIL) 1000 MG CAPS Take 100 mg by mouth daily.     No current facility-administered medications on file prior to visit.     Allergies: Red yeast rice [cholestin]  Review of Systems: General:   Denies fever, chills, unexplained weight loss.  Optho/Auditory:   Denies visual changes, blurred vision/LOV Respiratory:   Denies SOB, DOE more than baseline levels.  Cardiovascular:   Denies chest pain, palpitations, new onset peripheral edema  Gastrointestinal:   Denies nausea, vomiting, diarrhea.  Genitourinary: Denies dysuria, freq/ urgency, flank pain or discharge from genitals.  Endocrine:     Denies hot or cold intolerance, polyuria, polydipsia. Musculoskeletal:   Denies unexplained myalgias, joint swelling, unexplained arthralgias, gait problems.  Skin:  Denies rash, suspicious lesions Neurological:     Denies dizziness, unexplained weakness, numbness  Psychiatric/Behavioral:   Denies mood changes, suicidal or homicidal ideations, hallucinations    Objective:    Blood pressure 126/84, pulse 86, height 5' 4.25" (1.632 m), weight 258 lb 14.4 oz (117.4 kg), last menstrual period 11/29/2017, SpO2 99 %. Body mass index is 44.09  kg/m. General Appearance:    Alert, cooperative, no distress, appears stated age  Head:    Normocephalic, without obvious abnormality, atraumatic  Eyes:  PERRL, conjunctiva/corneas clear, EOM's intact, fundi    benign, both eyes  Ears:    Normal TM's and external ear canals, both ears  Nose:   Nares normal, septum midline, mucosa normal, no drainage    or sinus tenderness  Throat:   Lips w/o lesion, mucosa moist, and tongue normal; teeth and   gums normal  Neck:   Supple, symmetrical, trachea midline, no adenopathy;    thyroid:  no enlargement/tenderness/nodules; no carotid   bruit or JVD  Back:     Symmetric, no curvature, ROM normal, no CVA tenderness  Lungs:     Clear to auscultation bilaterally, respirations unlabored, no       Wh/ R/ R  Chest Wall:    No tenderness or gross deformity; normal excursion   Heart:    Regular rate and rhythm, S1 and S2 normal, no murmur, rub   or gallop  Breast Exam:    NA  Abdomen:     Soft, non-tender, bowel sounds active all four quadrants, NO   G/R/R, no masses, no organomegaly  Genitalia:    NA   Rectal:    NA  Extremities:   Extremities normal, atraumatic, no cyanosis or gross edema  Pulses:   2+ and symmetric all extremities  Skin:   Warm, dry, Skin color, texture, turgor normal, no obvious rashes or lesions Psych: No HI/SI, judgement and insight good, Euthymic mood. Full Affect.  Neurologic:   CNII-XII intact, normal strength, sensation and reflexes    Throughout

## 2017-12-20 NOTE — Patient Instructions (Addendum)
with your history of microscopic hematuria, since you are starting her period on the 27th and already starting a little spotting, please come in 1 week after your period is completely gone and you notice absolutely no spotting to come in for a UA to check for hematuria.     Preventive Care for Adults, Female  A healthy lifestyle and preventive care can promote health and wellness. Preventive health guidelines for women include the following key practices.   A routine yearly physical is a good way to check with your health care provider about your health and preventive screening. It is a chance to share any concerns and updates on your health and to receive a thorough exam.   Visit your dentist for a routine exam and preventive care every 6 months. Brush your teeth twice a day and floss once a day. Good oral hygiene prevents tooth decay and gum disease.   The frequency of eye exams is based on your age, health, family medical history, use of contact lenses, and other factors. Follow your health care provider's recommendations for frequency of eye exams.   Eat a healthy diet. Foods like vegetables, fruits, whole grains, low-fat dairy products, and lean protein foods contain the nutrients you need without too many calories. Decrease your intake of foods high in solid fats, added sugars, and salt. Eat the right amount of calories for you.Get information about a proper diet from your health care provider, if necessary.   Regular physical exercise is one of the most important things you can do for your health. Most adults should get at least 150 minutes of moderate-intensity exercise (any activity that increases your heart rate and causes you to sweat) each week. In addition, most adults need muscle-strengthening exercises on 2 or more days a week.   Maintain a healthy weight. The body mass index (BMI) is a screening tool to identify possible weight problems. It provides an estimate of body fat  based on height and weight. Your health care provider can find your BMI, and can help you achieve or maintain a healthy weight.For adults 20 years and older:   - A BMI below 18.5 is considered underweight.   - A BMI of 18.5 to 24.9 is normal.   - A BMI of 25 to 29.9 is considered overweight.   - A BMI of 30 and above is considered obese.   Maintain normal blood lipids and cholesterol levels by exercising and minimizing your intake of trans and saturated fats.  Eat a balanced diet with plenty of fruit and vegetables. Blood tests for lipids and cholesterol should begin at age 2 and be repeated every 5 years minimum.  If your lipid or cholesterol levels are high, you are over 40, or you are at high risk for heart disease, you may need your cholesterol levels checked more frequently.Ongoing high lipid and cholesterol levels should be treated with medicines if diet and exercise are not working.   If you smoke, find out from your health care provider how to quit. If you do not use tobacco, do not start.   Lung cancer screening is recommended for adults aged 75-80 years who are at high risk for developing lung cancer because of a history of smoking. A yearly low-dose CT scan of the lungs is recommended for people who have at least a 30-pack-year history of smoking and are a current smoker or have quit within the past 15 years. A pack year of smoking is smoking an average  of 1 pack of cigarettes a day for 1 year (for example: 1 pack a day for 30 years or 2 packs a day for 15 years). Yearly screening should continue until the smoker has stopped smoking for at least 15 years. Yearly screening should be stopped for people who develop a health problem that would prevent them from having lung cancer treatment.   If you are pregnant, do not drink alcohol. If you are breastfeeding, be very cautious about drinking alcohol. If you are not pregnant and choose to drink alcohol, do not have more than 1 drink per day.  One drink is considered to be 12 ounces (355 mL) of beer, 5 ounces (148 mL) of wine, or 1.5 ounces (44 mL) of liquor.   Avoid use of street drugs. Do not share needles with anyone. Ask for help if you need support or instructions about stopping the use of drugs.   High blood pressure causes heart disease and increases the risk of stroke. Your blood pressure should be checked at least yearly.  Ongoing high blood pressure should be treated with medicines if weight loss and exercise do not work.   If you are 55-79 years old, ask your health care provider if you should take aspirin to prevent strokes.   Diabetes screening involves taking a blood sample to check your fasting blood sugar level. This should be done once every 3 years, after age 45, if you are within normal weight and without risk factors for diabetes. Testing should be considered at a younger age or be carried out more frequently if you are overweight and have at least 1 risk factor for diabetes.   Breast cancer screening is essential preventive care for women. You should practice "breast self-awareness."  This means understanding the normal appearance and feel of your breasts and may include breast self-examination.  Any changes detected, no matter how small, should be reported to a health care provider.  Women in their 20s and 30s should have a clinical breast exam (CBE) by a health care provider as part of a regular health exam every 1 to 3 years.  After age 40, women should have a CBE every year.  Starting at age 40, women should consider having a mammogram (breast X-ray test) every year.  Women who have a family history of breast cancer should talk to their health care provider about genetic screening.  Women at a high risk of breast cancer should talk to their health care providers about having an MRI and a mammogram every year.   -Breast cancer gene (BRCA)-related cancer risk assessment is recommended for women who have family  members with BRCA-related cancers. BRCA-related cancers include breast, ovarian, tubal, and peritoneal cancers. Having family members with these cancers may be associated with an increased risk for harmful changes (mutations) in the breast cancer genes BRCA1 and BRCA2. Results of the assessment will determine the need for genetic counseling and BRCA1 and BRCA2 testing.   The Pap test is a screening test for cervical cancer. A Pap test can show cell changes on the cervix that might become cervical cancer if left untreated. A Pap test is a procedure in which cells are obtained and examined from the lower end of the uterus (cervix).   - Women should have a Pap test starting at age 21.   - Between ages 21 and 29, Pap tests should be repeated every 2 years.   - Beginning at age 30, you should have a Pap test   every 3 years as long as the past 3 Pap tests have been normal.   - Some women have medical problems that increase the chance of getting cervical cancer. Talk to your health care provider about these problems. It is especially important to talk to your health care provider if a new problem develops soon after your last Pap test. In these cases, your health care provider may recommend more frequent screening and Pap tests.   - The above recommendations are the same for women who have or have not gotten the vaccine for human papillomavirus (HPV).   - If you had a hysterectomy for a problem that was not cancer or a condition that could lead to cancer, then you no longer need Pap tests. Even if you no longer need a Pap test, a regular exam is a good idea to make sure no other problems are starting.   - If you are between ages 40 and 20 years, and you have had normal Pap tests going back 10 years, you no longer need Pap tests. Even if you no longer need a Pap test, a regular exam is a good idea to make sure no other problems are starting.   - If you have had past treatment for cervical cancer or a  condition that could lead to cancer, you need Pap tests and screening for cancer for at least 20 years after your treatment.   - If Pap tests have been discontinued, risk factors (such as a new sexual partner) need to be reassessed to determine if screening should be resumed.   - The HPV test is an additional test that may be used for cervical cancer screening. The HPV test looks for the virus that can cause the cell changes on the cervix. The cells collected during the Pap test can be tested for HPV. The HPV test could be used to screen women aged 28 years and older, and should be used in women of any age who have unclear Pap test results. After the age of 19, women should have HPV testing at the same frequency as a Pap test.   Colorectal cancer can be detected and often prevented. Most routine colorectal cancer screening begins at the age of 61 years and continues through age 6 years. However, your health care provider may recommend screening at an earlier age if you have risk factors for colon cancer. On a yearly basis, your health care provider may provide home test kits to check for hidden blood in the stool.  Use of a small camera at the end of a tube, to directly examine the colon (sigmoidoscopy or colonoscopy), can detect the earliest forms of colorectal cancer. Talk to your health care provider about this at age 20, when routine screening begins. Direct exam of the colon should be repeated every 5 -10 years through age 23 years, unless early forms of pre-cancerous polyps or small growths are found.   People who are at an increased risk for hepatitis B should be screened for this virus. You are considered at high risk for hepatitis B if:  -You were born in a country where hepatitis B occurs often. Talk with your health care provider about which countries are considered high risk.  - Your parents were born in a high-risk country and you have not received a shot to protect against hepatitis B  (hepatitis B vaccine).  - You have HIV or AIDS.  - You use needles to inject street drugs.  -  You live with, or have sex with, someone who has Hepatitis B.  - You get hemodialysis treatment.  - You take certain medicines for conditions like cancer, organ transplantation, and autoimmune conditions.   Hepatitis C blood testing is recommended for all people born from [birth year] through 1965 and any individual with known risks for hepatitis C.   Practice safe sex. Use condoms and avoid high-risk sexual practices to reduce the spread of sexually transmitted infections (STIs). STIs include gonorrhea, chlamydia, syphilis, trichomonas, herpes, HPV, and human immunodeficiency virus (HIV). Herpes, HIV, and HPV are viral illnesses that have no cure. They can result in disability, cancer, and death. Sexually active women aged 73 years and younger should be checked for chlamydia. Older women with new or multiple partners should also be tested for chlamydia. Testing for other STIs is recommended if you are sexually active and at increased risk.   Osteoporosis is a disease in which the bones lose minerals and strength with aging. This can result in serious bone fractures or breaks. The risk of osteoporosis can be identified using a bone density scan. Women ages 10 years and over and women at risk for fractures or osteoporosis should discuss screening with their health care providers. Ask your health care provider whether you should take a calcium supplement or vitamin D to There are also several preventive steps women can take to avoid osteoporosis and resulting fractures or to keep osteoporosis from worsening. -->Recommendations include:  Eat a balanced diet high in fruits, vegetables, calcium, and vitamins.  Get enough calcium. The recommended total intake of is 1,200 mg daily; for best absorption, if taking supplements, divide doses into 250-500 mg doses throughout the day. Of the two types of calcium, calcium  carbonate is best absorbed when taken with food but calcium citrate can be taken on an empty stomach.  Get enough vitamin D. [employer] and the [place] recommend at least 1,000 IU per day for women age 16 and over who are at risk of vitamin D deficiency. Vitamin D deficiency can be caused by inadequate sun exposure (for example, those who live in [place]).  Avoid alcohol and smoking. Heavy alcohol intake (more than 7 drinks per week) increases the risk of falls and hip fracture and women smokers tend to lose bone more rapidly and have lower bone mass than nonsmokers. Stopping smoking is one of the most important changes women can make to improve their health and decrease risk for disease.  Be physically active every day. Weight-bearing exercise (for example, fast walking, hiking, jogging, and weight training) may strengthen bones or slow the rate of bone loss that comes with aging. Balancing and muscle-strengthening exercises can reduce the risk of falling and fracture.  Consider therapeutic medications. Currently, several types of effective drugs are available. Healthcare providers can recommend the type most appropriate for each woman.  Eliminate environmental factors that may contribute to accidents. Falls cause nearly 90% of all osteoporotic fractures, so reducing this risk is an important bone-health strategy. Measures include ample lighting, removing obstructions to walking, using nonskid rugs on floors, and placing mats and/or grab bars in showers.  Be aware of medication side effects. Some common medicines make bones weaker. These include a type of steroid drug called glucocorticoids used for arthritis and asthma, some antiseizure drugs, certain sleeping pills, treatments for endometriosis, and some cancer drugs. An overactive thyroid gland or using too much thyroid hormone for an underactive thyroid can also be a problem. If you are  taking these medicines, talk to  your doctor about what you can do to help protect your bones.reduce the rate of osteoporosis.    Menopause can be associated with physical symptoms and risks. Hormone replacement therapy is available to decrease symptoms and risks. You should talk to your health care provider about whether hormone replacement therapy is right for you.   Use sunscreen. Apply sunscreen liberally and repeatedly throughout the day. You should seek shade when your shadow is shorter than you. Protect yourself by wearing long sleeves, pants, a wide-brimmed hat, and sunglasses year round, whenever you are outdoors.   Once a month, do a whole body skin exam, using a mirror to look at the skin on your back. Tell your health care provider of new moles, moles that have irregular borders, moles that are larger than a pencil eraser, or moles that have changed in shape or color.   -Stay current with required vaccines (immunizations).   Influenza vaccine. All adults should be immunized every year.  Tetanus, diphtheria, and acellular pertussis (Td, Tdap) vaccine. Pregnant women should receive 1 dose of Tdap vaccine during each pregnancy. The dose should be obtained regardless of the length of time since the last dose. Immunization is preferred during the 27th 36th week of gestation. An adult who has not previously received Tdap or who does not know her vaccine status should receive 1 dose of Tdap. This initial dose should be followed by tetanus and diphtheria toxoids (Td) booster doses every 10 years. Adults with an unknown or incomplete history of completing a 3-dose immunization series with Td-containing vaccines should begin or complete a primary immunization series including a Tdap dose. Adults should receive a Td booster every 10 years.  Varicella vaccine. An adult without evidence of immunity to varicella should receive 2 doses or a second dose if she has previously received 1 dose. Pregnant females who do not have evidence  of immunity should receive the first dose after pregnancy. This first dose should be obtained before leaving the health care facility. The second dose should be obtained 4 8 weeks after the first dose.  Human papillomavirus (HPV) vaccine. Females aged 13 26 years who have not received the vaccine previously should obtain the 3-dose series. The vaccine is not recommended for use in pregnant females. However, pregnancy testing is not needed before receiving a dose. If a female is found to be pregnant after receiving a dose, no treatment is needed. In that case, the remaining doses should be delayed until after the pregnancy. Immunization is recommended for any person with an immunocompromised condition through the age of 26 years if she did not get any or all doses earlier. During the 3-dose series, the second dose should be obtained 4 8 weeks after the first dose. The third dose should be obtained 24 weeks after the first dose and 16 weeks after the second dose.  Zoster vaccine. One dose is recommended for adults aged 60 years or older unless certain conditions are present.  Measles, mumps, and rubella (MMR) vaccine. Adults born before 1957 generally are considered immune to measles and mumps. Adults born in 1957 or later should have 1 or more doses of MMR vaccine unless there is a contraindication to the vaccine or there is laboratory evidence of immunity to each of the three diseases. A routine second dose of MMR vaccine should be obtained at least 28 days after the first dose for students attending postsecondary schools, health care workers, or international travelers. People   who received inactivated measles vaccine or an unknown type of measles vaccine during 1963 1967 should receive 2 doses of MMR vaccine. People who received inactivated mumps vaccine or an unknown type of mumps vaccine before 1979 and are at high risk for mumps infection should consider immunization with 2 doses of MMR vaccine. For  females of childbearing age, rubella immunity should be determined. If there is no evidence of immunity, females who are not pregnant should be vaccinated. If there is no evidence of immunity, females who are pregnant should delay immunization until after pregnancy. Unvaccinated health care workers born before 1957 who lack laboratory evidence of measles, mumps, or rubella immunity or laboratory confirmation of disease should consider measles and mumps immunization with 2 doses of MMR vaccine or rubella immunization with 1 dose of MMR vaccine.  Pneumococcal 13-valent conjugate (PCV13) vaccine. When indicated, a person who is uncertain of her immunization history and has no record of immunization should receive the PCV13 vaccine. An adult aged 19 years or older who has certain medical conditions and has not been previously immunized should receive 1 dose of PCV13 vaccine. This PCV13 should be followed with a dose of pneumococcal polysaccharide (PPSV23) vaccine. The PPSV23 vaccine dose should be obtained at least 8 weeks after the dose of PCV13 vaccine. An adult aged 19 years or older who has certain medical conditions and previously received 1 or more doses of PPSV23 vaccine should receive 1 dose of PCV13. The PCV13 vaccine dose should be obtained 1 or more years after the last PPSV23 vaccine dose.  Pneumococcal polysaccharide (PPSV23) vaccine. When PCV13 is also indicated, PCV13 should be obtained first. All adults aged 65 years and older should be immunized. An adult younger than age 65 years who has certain medical conditions should be immunized. Any person who resides in a nursing home or long-term care facility should be immunized. An adult smoker should be immunized. People with an immunocompromised condition and certain other conditions should receive both PCV13 and PPSV23 vaccines. People with human immunodeficiency virus (HIV) infection should be immunized as soon as possible after diagnosis.  Immunization during chemotherapy or radiation therapy should be avoided. Routine use of PPSV23 vaccine is not recommended for American Indians, [place] Natives, or people younger than 65 years unless there are medical conditions that require PPSV23 vaccine. When indicated, people who have unknown immunization and have no record of immunization should receive PPSV23 vaccine. One-time revaccination 5 years after the first dose of PPSV23 is recommended for people aged 19 64 years who have chronic kidney failure, nephrotic syndrome, asplenia, or immunocompromised conditions. People who received 1 2 doses of PPSV23 before age 65 years should receive another dose of PPSV23 vaccine at age 65 years or later if at least 5 years have passed since the previous dose. Doses of PPSV23 are not needed for people immunized with PPSV23 at or after age 65 years.  Meningococcal vaccine. Adults with asplenia or persistent complement component deficiencies should receive 2 doses of quadrivalent meningococcal conjugate (MenACWY-D) vaccine. The doses should be obtained at least 2 months apart. Microbiologists working with certain meningococcal bacteria, military recruits, people at risk during an outbreak, and people who travel to or live in countries with a high rate of meningitis should be immunized. A first-year college student up through age 21 years who is living in a residence hall should receive a dose if she did not receive a dose on or after her 16th birthday. Adults who have certain high-risk conditions   should receive one or more doses of vaccine.  Hepatitis A vaccine. Adults who wish to be protected from this disease, have certain high-risk conditions, work with hepatitis A-infected animals, work in hepatitis A research labs, or travel to or work in countries with a high rate of hepatitis A should be immunized. Adults who were previously unvaccinated and who anticipate close contact with an international adoptee during the  first 60 days after arrival in the [place] from a country with a high rate of hepatitis A should be immunized.  Hepatitis B vaccine.  Adults who wish to be protected from this disease, have certain high-risk conditions, may be exposed to blood or other infectious body fluids, are household contacts or sex partners of hepatitis B positive people, are clients or workers in certain care facilities, or travel to or work in countries with a high rate of hepatitis B should be immunized.  Haemophilus influenzae type b (Hib) vaccine. A previously unvaccinated person with asplenia or sickle cell disease or having a scheduled splenectomy should receive 1 dose of Hib vaccine. Regardless of previous immunization, a recipient of a hematopoietic stem cell transplant should receive a 3-dose series 6 12 months after her successful transplant. Hib vaccine is not recommended for adults with HIV infection.  Preventive Services / Frequency Ages 19 to 39years  Blood pressure check.** / Every 1 to 2 years.  Lipid and cholesterol check.** / Every 5 years beginning at age 20.  Clinical breast exam.** / Every 3 years for women in their 20s and 30s.  BRCA-related cancer risk assessment.** / For women who have family members with a BRCA-related cancer (breast, ovarian, tubal, or peritoneal cancers).  Pap test.** / Every 2 years from ages 21 through 29. Every 3 years starting at age 30 through age 65 or 70 with a history of 3 consecutive normal Pap tests.  HPV screening.** / Every 3 years from ages 30 through ages 65 to 70 with a history of 3 consecutive normal Pap tests.  Hepatitis C blood test.** / For any individual with known risks for hepatitis C.  Skin self-exam. / Monthly.  Influenza vaccine. / Every year.  Tetanus, diphtheria, and acellular pertussis (Tdap, Td) vaccine.** / Consult your health care provider. Pregnant women should receive 1 dose of Tdap vaccine during each pregnancy. 1 dose of Td every  10 years.  Varicella vaccine.** / Consult your health care provider. Pregnant females who do not have evidence of immunity should receive the first dose after pregnancy.  HPV vaccine. / 3 doses over 6 months, if 26 and younger. The vaccine is not recommended for use in pregnant females. However, pregnancy testing is not needed before receiving a dose.  Measles, mumps, rubella (MMR) vaccine.** / You need at least 1 dose of MMR if you were born in 1957 or later. You may also need a 2nd dose. For females of childbearing age, rubella immunity should be determined. If there is no evidence of immunity, females who are not pregnant should be vaccinated. If there is no evidence of immunity, females who are pregnant should delay immunization until after pregnancy.  Pneumococcal 13-valent conjugate (PCV13) vaccine.** / Consult your health care provider.  Pneumococcal polysaccharide (PPSV23) vaccine.** / 1 to 2 doses if you smoke cigarettes or if you have certain conditions.  Meningococcal vaccine.** / 1 dose if you are age 19 to 21 years and a first-year college student living in a residence hall, or have one of several medical conditions, you   need to get vaccinated against meningococcal disease. You may also need additional booster doses.  Hepatitis A vaccine.** / Consult your health care provider.  Hepatitis B vaccine.** / Consult your health care provider.  Haemophilus influenzae type b (Hib) vaccine.** / Consult your health care provider.  Ages 40 to 64years  Blood pressure check.** / Every 1 to 2 years.  Lipid and cholesterol check.** / Every 5 years beginning at age 20 years.  Lung cancer screening. / Every year if you are aged 55 80 years and have a 30-pack-year history of smoking and currently smoke or have quit within the past 15 years. Yearly screening is stopped once you have quit smoking for at least 15 years or develop a health problem that would prevent you from having lung cancer  treatment.  Clinical breast exam.** / Every year after age 40 years.  BRCA-related cancer risk assessment.** / For women who have family members with a BRCA-related cancer (breast, ovarian, tubal, or peritoneal cancers).  Mammogram.** / Every year beginning at age 40 years and continuing for as long as you are in good health. Consult with your health care provider.  Pap test.** / Every 3 years starting at age 30 years through age 65 or 70 years with a history of 3 consecutive normal Pap tests.  HPV screening.** / Every 3 years from ages 30 years through ages 65 to 70 years with a history of 3 consecutive normal Pap tests.  Fecal occult blood test (FOBT) of stool. / Every year beginning at age 50 years and continuing until age 75 years. You may not need to do this test if you get a colonoscopy every 10 years.  Flexible sigmoidoscopy or colonoscopy.** / Every 5 years for a flexible sigmoidoscopy or every 10 years for a colonoscopy beginning at age 50 years and continuing until age 75 years.  Hepatitis C blood test.** / For all people born from 1945 through 1965 and any individual with known risks for hepatitis C.  Skin self-exam. / Monthly.  Influenza vaccine. / Every year.  Tetanus, diphtheria, and acellular pertussis (Tdap/Td) vaccine.** / Consult your health care provider. Pregnant women should receive 1 dose of Tdap vaccine during each pregnancy. 1 dose of Td every 10 years.  Varicella vaccine.** / Consult your health care provider. Pregnant females who do not have evidence of immunity should receive the first dose after pregnancy.  Zoster vaccine.** / 1 dose for adults aged 60 years or older.  Measles, mumps, rubella (MMR) vaccine.** / You need at least 1 dose of MMR if you were born in 1957 or later. You may also need a 2nd dose. For females of childbearing age, rubella immunity should be determined. If there is no evidence of immunity, females who are not pregnant should be  vaccinated. If there is no evidence of immunity, females who are pregnant should delay immunization until after pregnancy.  Pneumococcal 13-valent conjugate (PCV13) vaccine.** / Consult your health care provider.  Pneumococcal polysaccharide (PPSV23) vaccine.** / 1 to 2 doses if you smoke cigarettes or if you have certain conditions.  Meningococcal vaccine.** / Consult your health care provider.  Hepatitis A vaccine.** / Consult your health care provider.  Hepatitis B vaccine.** / Consult your health care provider.  Haemophilus influenzae type b (Hib) vaccine.** / Consult your health care provider.  Ages 65 years and over  Blood pressure check.** / Every 1 to 2 years.  Lipid and cholesterol check.** / Every 5 years beginning at age 20   years.  Lung cancer screening. / Every year if you are aged 55 80 years and have a 30-pack-year history of smoking and currently smoke or have quit within the past 15 years. Yearly screening is stopped once you have quit smoking for at least 15 years or develop a health problem that would prevent you from having lung cancer treatment.  Clinical breast exam.** / Every year after age 40 years.  BRCA-related cancer risk assessment.** / For women who have family members with a BRCA-related cancer (breast, ovarian, tubal, or peritoneal cancers).  Mammogram.** / Every year beginning at age 40 years and continuing for as long as you are in good health. Consult with your health care provider.  Pap test.** / Every 3 years starting at age 30 years through age 65 or 70 years with 3 consecutive normal Pap tests. Testing can be stopped between 65 and 70 years with 3 consecutive normal Pap tests and no abnormal Pap or HPV tests in the past 10 years.  HPV screening.** / Every 3 years from ages 30 years through ages 65 or 70 years with a history of 3 consecutive normal Pap tests. Testing can be stopped between 65 and 70 years with 3 consecutive normal Pap tests and no  abnormal Pap or HPV tests in the past 10 years.  Fecal occult blood test (FOBT) of stool. / Every year beginning at age 50 years and continuing until age 75 years. You may not need to do this test if you get a colonoscopy every 10 years.  Flexible sigmoidoscopy or colonoscopy.** / Every 5 years for a flexible sigmoidoscopy or every 10 years for a colonoscopy beginning at age 50 years and continuing until age 75 years.  Hepatitis C blood test.** / For all people born from 1945 through 1965 and any individual with known risks for hepatitis C.  Osteoporosis screening.** / A one-time screening for women ages 65 years and over and women at risk for fractures or osteoporosis.  Skin self-exam. / Monthly.  Influenza vaccine. / Every year.  Tetanus, diphtheria, and acellular pertussis (Tdap/Td) vaccine.** / 1 dose of Td every 10 years.  Varicella vaccine.** / Consult your health care provider.  Zoster vaccine.** / 1 dose for adults aged 60 years or older.  Pneumococcal 13-valent conjugate (PCV13) vaccine.** / Consult your health care provider.  Pneumococcal polysaccharide (PPSV23) vaccine.** / 1 dose for all adults aged 65 years and older.  Meningococcal vaccine.** / Consult your health care provider.  Hepatitis A vaccine.** / Consult your health care provider.  Hepatitis B vaccine.** / Consult your health care provider.  Haemophilus influenzae type b (Hib) vaccine.** / Consult your health care provider. ** Family history and personal history of risk and conditions may change your health care provider's recommendations. Document Released: 01/10/2002 Document Revised: 09/04/2013  [employer] Patient Information 2014 [employer]   EXERCISE AND DIET:  We recommended that you start or continue a regular exercise program for good health. Regular exercise means any activity that makes your heart beat faster and makes you sweat.  We recommend exercising at least 30 minutes per day at least 3  days a week, preferably 5.  We also recommend a diet low in fat and sugar / carbohydrates.  Inactivity, poor dietary choices and obesity can cause diabetes, heart attack, stroke, and kidney damage, among others.     ALCOHOL AND SMOKING:  Women should limit their alcohol intake to no more than 7 drinks/beers/glasses of wine (combined, not each!) per   week. Moderation of alcohol intake to this level decreases your risk of breast cancer and liver damage.  ( And of course, no recreational drugs are part of a healthy lifestyle.)  Also, you should not be smoking at all or even being exposed to second hand smoke. Most people know smoking can cause cancer, and various heart and lung diseases, but did you know it also contributes to weakening of your bones?  Aging of your skin?  Yellowing of your teeth and nails?   CALCIUM AND VITAMIN D:  Adequate intake of calcium and Vitamin D are recommended.  The recommendations for exact amounts of these supplements seem to change often, but generally speaking 600 mg of calcium (either carbonate or citrate) and 800 units of Vitamin D per day seems prudent. Certain women may benefit from higher intake of Vitamin D.  If you are among these women, your doctor will have told you during your visit.     PAP SMEARS:  Pap smears, to check for cervical cancer or precancers,  have traditionally been done yearly, although recent scientific advances have shown that most women can have pap smears less often.  However, every woman still should have a physical exam from her gynecologist or primary care physician every year. It will include a breast check, inspection of the vulva and vagina to check for abnormal growths or skin changes, a visual exam of the cervix, and then an exam to evaluate the size and shape of the uterus and ovaries.  And after 46 years of age, a rectal exam is indicated to check for rectal cancers. We will also provide age appropriate advice regarding health  maintenance, like when you should have certain vaccines, screening for sexually transmitted diseases, bone density testing, colonoscopy, mammograms, etc.    MAMMOGRAMS:  All women over 42 years old should have a yearly mammogram. Many facilities now offer a "3D" mammogram, which may cost around $50 extra out of pocket. If possible,  we recommend you accept the option to have the 3D mammogram performed.  It both reduces the number of women who will be called back for extra views which then turn out to be normal, and it is better than the routine mammogram at detecting truly abnormal areas.     COLONOSCOPY:  Colonoscopy to screen for colon cancer is recommended for all women at age 32.  We know, you hate the idea of the prep.  We agree, BUT, having colon cancer and not knowing it is worse!!  Colon cancer so often starts as a polyp that can be seen and removed at colonscopy, which can quite literally save your life!  And if your first colonoscopy is normal and you have no family history of colon cancer, most women don't have to have it again for 10 years.  Once every ten years, you can do something that may end up saving your life, right?  We will be happy to help you get it scheduled when you are ready.  Be sure to check your insurance coverage so you understand how much it will cost.  It may be covered as a preventative service at no cost, but you should check your particular policy.

## 2017-12-21 LAB — LIPID PANEL
CHOL/HDL RATIO: 4.3 ratio (ref 0.0–4.4)
Cholesterol, Total: 184 mg/dL (ref 100–199)
HDL: 43 mg/dL (ref >39)
LDL CALC: 125 mg/dL — AB (ref 0–99)
TRIGLYCERIDES: 80 mg/dL (ref 0–149)
VLDL CHOLESTEROL CAL: 16 mg/dL (ref 5–40)

## 2017-12-21 LAB — CBC WITH DIFFERENTIAL/PLATELET
BASOS ABS: 0 10*3/uL (ref 0.0–0.2)
BASOS: 0 %
EOS (ABSOLUTE): 0.1 10*3/uL (ref 0.0–0.4)
EOS: 2 %
HEMATOCRIT: 39.2 % (ref 34.0–46.6)
HEMOGLOBIN: 13.3 g/dL (ref 11.1–15.9)
IMMATURE GRANS (ABS): 0 10*3/uL (ref 0.0–0.1)
Immature Granulocytes: 1 %
LYMPHS: 21 %
Lymphocytes Absolute: 1.7 10*3/uL (ref 0.7–3.1)
MCH: 28.1 pg (ref 26.6–33.0)
MCHC: 33.9 g/dL (ref 31.5–35.7)
MCV: 83 fL (ref 79–97)
MONOCYTES: 9 %
Monocytes Absolute: 0.7 10*3/uL (ref 0.1–0.9)
Neutrophils Absolute: 5.2 10*3/uL (ref 1.4–7.0)
Neutrophils: 67 %
Platelets: 244 10*3/uL (ref 150–379)
RBC: 4.73 x10E6/uL (ref 3.77–5.28)
RDW: 14.2 % (ref 12.3–15.4)
WBC: 7.7 10*3/uL (ref 3.4–10.8)

## 2017-12-21 LAB — TSH: TSH: 1.2 u[IU]/mL (ref 0.450–4.500)

## 2017-12-21 LAB — COMPREHENSIVE METABOLIC PANEL
ALT: 11 IU/L (ref 0–32)
AST: 9 IU/L (ref 0–40)
Albumin/Globulin Ratio: 1.5 (ref 1.2–2.2)
Albumin: 4 g/dL (ref 3.5–5.5)
Alkaline Phosphatase: 68 IU/L (ref 39–117)
BUN/Creatinine Ratio: 21 (ref 9–23)
BUN: 16 mg/dL (ref 6–24)
Bilirubin Total: 0.3 mg/dL (ref 0.0–1.2)
CO2: 24 mmol/L (ref 20–29)
CREATININE: 0.76 mg/dL (ref 0.57–1.00)
Calcium: 9.5 mg/dL (ref 8.7–10.2)
Chloride: 100 mmol/L (ref 96–106)
GFR calc non Af Amer: 94 mL/min/{1.73_m2} (ref >59)
GFR, EST AFRICAN AMERICAN: 109 mL/min/{1.73_m2} (ref >59)
Globulin, Total: 2.6 g/dL (ref 1.5–4.5)
Glucose: 97 mg/dL (ref 65–99)
Potassium: 4.3 mmol/L (ref 3.5–5.2)
Sodium: 139 mmol/L (ref 134–144)
TOTAL PROTEIN: 6.6 g/dL (ref 6.0–8.5)

## 2017-12-21 LAB — MAGNESIUM: MAGNESIUM: 2.1 mg/dL (ref 1.6–2.3)

## 2017-12-21 LAB — VITAMIN D 25 HYDROXY (VIT D DEFICIENCY, FRACTURES): Vit D, 25-Hydroxy: 63.3 ng/mL (ref 30.0–100.0)

## 2017-12-21 LAB — HEMOGLOBIN A1C
Est. average glucose Bld gHb Est-mCnc: 105 mg/dL
HEMOGLOBIN A1C: 5.3 % (ref 4.8–5.6)

## 2017-12-21 LAB — PHOSPHORUS: Phosphorus: 3.2 mg/dL (ref 2.5–4.5)

## 2017-12-22 ENCOUNTER — Encounter: Payer: Self-pay | Admitting: Family Medicine

## 2018-04-11 ENCOUNTER — Other Ambulatory Visit: Payer: Self-pay | Admitting: Obstetrics and Gynecology

## 2018-04-11 ENCOUNTER — Ambulatory Visit
Admission: RE | Admit: 2018-04-11 | Discharge: 2018-04-11 | Disposition: A | Discharge status: Home / Self Care | Payer: Commercial Managed Care - PPO | Source: Ambulatory Visit | Attending: Obstetrics and Gynecology | Admitting: Obstetrics and Gynecology

## 2018-04-11 DIAGNOSIS — N6489 Other specified disorders of breast: Secondary | ICD-10-CM

## 2018-04-11 DIAGNOSIS — N6320 Unspecified lump in the left breast, unspecified quadrant: Secondary | ICD-10-CM

## 2018-04-11 DIAGNOSIS — N6323 Unspecified lump in the left breast, lower outer quadrant: Secondary | ICD-10-CM | POA: Diagnosis not present

## 2018-04-11 DIAGNOSIS — R928 Other abnormal and inconclusive findings on diagnostic imaging of breast: Secondary | ICD-10-CM | POA: Diagnosis not present

## 2018-04-24 ENCOUNTER — Other Ambulatory Visit: Payer: Self-pay | Admitting: Family Medicine

## 2018-05-18 ENCOUNTER — Emergency Department (HOSPITAL_COMMUNITY): Payer: Commercial Managed Care - PPO

## 2018-05-18 ENCOUNTER — Other Ambulatory Visit: Payer: Self-pay

## 2018-05-18 ENCOUNTER — Emergency Department (HOSPITAL_COMMUNITY)
Admission: EM | Admit: 2018-05-18 | Discharge: 2018-05-18 | Disposition: A | Discharge status: Home / Self Care | Payer: Commercial Managed Care - PPO | Attending: Emergency Medicine | Admitting: Emergency Medicine

## 2018-05-18 ENCOUNTER — Encounter (HOSPITAL_COMMUNITY): Payer: Self-pay | Admitting: Emergency Medicine

## 2018-05-18 DIAGNOSIS — Z87891 Personal history of nicotine dependence: Secondary | ICD-10-CM | POA: Diagnosis not present

## 2018-05-18 DIAGNOSIS — R079 Chest pain, unspecified: Secondary | ICD-10-CM | POA: Diagnosis not present

## 2018-05-18 DIAGNOSIS — Z7982 Long term (current) use of aspirin: Secondary | ICD-10-CM | POA: Diagnosis not present

## 2018-05-18 DIAGNOSIS — Z79899 Other long term (current) drug therapy: Secondary | ICD-10-CM | POA: Diagnosis not present

## 2018-05-18 DIAGNOSIS — R61 Generalized hyperhidrosis: Secondary | ICD-10-CM | POA: Diagnosis not present

## 2018-05-18 DIAGNOSIS — R0602 Shortness of breath: Secondary | ICD-10-CM | POA: Diagnosis not present

## 2018-05-18 DIAGNOSIS — R0600 Dyspnea, unspecified: Secondary | ICD-10-CM | POA: Diagnosis not present

## 2018-05-18 DIAGNOSIS — I10 Essential (primary) hypertension: Secondary | ICD-10-CM | POA: Diagnosis not present

## 2018-05-18 DIAGNOSIS — R0789 Other chest pain: Secondary | ICD-10-CM | POA: Diagnosis not present

## 2018-05-18 LAB — I-STAT TROPONIN, ED: TROPONIN I, POC: 0.01 ng/mL (ref 0.00–0.08)

## 2018-05-18 LAB — BASIC METABOLIC PANEL
ANION GAP: 8 (ref 5–15)
BUN: 10 mg/dL (ref 6–20)
CHLORIDE: 104 mmol/L (ref 101–111)
CO2: 27 mmol/L (ref 22–32)
Calcium: 9.3 mg/dL (ref 8.9–10.3)
Creatinine, Ser: 0.78 mg/dL (ref 0.44–1.00)
GFR calc non Af Amer: >60 mL/min (ref >60)
GLUCOSE: 118 mg/dL — AB (ref 65–99)
POTASSIUM: 3.6 mmol/L (ref 3.5–5.1)
Sodium: 139 mmol/L (ref 135–145)

## 2018-05-18 LAB — I-STAT BETA HCG BLOOD, ED (MC, WL, AP ONLY): I-stat hCG, quantitative: <5 m[IU]/mL (ref <5)

## 2018-05-18 LAB — CBC
HEMATOCRIT: 41.4 % (ref 36.0–46.0)
HEMOGLOBIN: 13.1 g/dL (ref 12.0–15.0)
MCH: 26.8 pg (ref 26.0–34.0)
MCHC: 31.6 g/dL (ref 30.0–36.0)
MCV: 84.7 fL (ref 78.0–100.0)
Platelets: 233 10*3/uL (ref 150–400)
RBC: 4.89 MIL/uL (ref 3.87–5.11)
RDW: 13.3 % (ref 11.5–15.5)
WBC: 6.2 10*3/uL (ref 4.0–10.5)

## 2018-05-18 NOTE — Discharge Instructions (Addendum)
Tests showed no life-threatening condition.  Follow-up with your primary care doctor or return if worse.

## 2018-05-18 NOTE — ED Triage Notes (Signed)
Pt reports constant centralized chest pain starting yesterday, non radiating. Denies dizziness, nausea, shortness of breath. Pt does reports some discomfort in her back as well.

## 2018-05-18 NOTE — ED Provider Notes (Signed)
[facility] EMERGENCY DEPARTMENT Provider Note   CSN: [id number] Arrival date & time: 05/18/18  1012     History   Chief Complaint Chief Complaint  Patient presents with  . Chest Pain    HPI Gina Farrell is a 47 y.o. female.  Lower external chest pain described as burning without radiation, dyspnea, diaphoresis, nausea.  No previous history of cardiac disease past medical history includes hypertension and obesity but no diabetes or cigarette smoking.  Family history negative for early MI.  She took over-the-counter GERD medications with minimal success.  Palpation makes pain worse.  Severity is mild to moderate.  No recent surgery, travel, prolonged immobilization.     Past Medical History:  Diagnosis Date  . Allergic rhinitis   . Chicken pox   . GERD (gastroesophageal reflux disease)   . Hypertension   . Miscarriage 1998/2008  . Vitamin D deficiency   . Vitamin D deficiency     Patient Active Problem List   Diagnosis Date Noted  . h/o Asymptomatic microscopic hematuria- in GYN office 12/20/2017  . Encounter for wellness examination 12/20/2017  . BMI 40.0-44.9, adult ([place]) 04/17/2017  . Allergic rhinitis 09/18/2016  . H/O tinea corporis 09/01/2016  . Hypomagnesemia 09/01/2016  . Hypertriglyceridemia 09/01/2016  . GERD (gastroesophageal reflux disease) 01/13/2015  . Environmental allergies   . Hyperlipidemia   . Vitamin D deficiency   . Benign essential hypertension 02/27/2012    Past Surgical History:  Procedure Laterality Date  . CESAREAN SECTION     x 2  . d and c    . TUBAL LIGATION       OB History   None      Home Medications    Prior to Admission medications   Medication Sig Start Date End Date Taking? Authorizing Provider  aspirin 81 MG tablet Take 81 mg by mouth daily.    [provider]  Cholecalciferol (VITAMIN D3) 5000 units TABS Take 1 tablet by mouth daily.    [provider]  ciclopirox (LOPROX)  0.77 % cream Apply topically 2 (two) times daily. Patient taking differently: Apply 1 application topically as needed.  03/13/15   Jearld Fenton, NP  cyanocobalamin 500 MCG tablet Take 500 mcg by mouth daily.    [provider]  famciclovir (FAMVIR) 500 MG tablet Take 1 tablet (500 mg total) by mouth 3 (three) times daily. Patient taking differently: Take 500 mg by mouth as needed.  01/29/14   Kelby Aline, PA-C  fluticasone (FLONASE) 50 MCG/ACT nasal spray Place into both nostrils as needed.     [provider]  montelukast (SINGULAIR) 10 MG tablet TAKE 1 TABLET BY MOUTH AT BEDTIME. 04/25/18   Opalski, Deborah, DO  Multiple Vitamins-Minerals (MULTIVITAMIN PO) Take by mouth daily.    [provider]  olmesartan-hydrochlorothiazide (BENICAR HCT) 20-12.5 MG tablet TAKE 2 TABLETS BY MOUTH DAILY **OLMESARTAN-HCTZ 40-25 MG ON BACKORDER** 04/25/18   Opalski, Deborah, DO  olmesartan-hydrochlorothiazide (BENICAR HCT) 40-25 MG tablet Take 1 tablet by mouth daily. 04/17/17   Opalski, Neoma Laming, DO  Omega-3 Fatty Acids (FISH OIL) 1000 MG CAPS Take 100 mg by mouth daily.    [provider]    Family History Family History  Problem Relation Age of Onset  . Hypertension Mother   . Stroke Mother   . Atrial fibrillation Mother   . Diabetes Mother   . Rheum arthritis Maternal Grandmother   . Coronary artery disease Maternal Grandmother   .  Hypertension Maternal Grandmother   . Ulcerative colitis Father   . Cancer Neg Hx     Social History Social History   Tobacco Use  . Smoking status: Former Smoker    Packs/day: 0.50    Years: 14.00    Pack years: 7.00    Last attempt to quit: 11/28/2001    Years since quitting: 16.4  . Smokeless tobacco: Never Used  Substance Use Topics  . Alcohol use: No    Alcohol/week: 0.0 oz    Comment: rare  . Drug use: No     Allergies   Red yeast rice [cholestin]   Review of Systems Review of Systems  All other systems  reviewed and are negative.    Physical Exam Updated Vital Signs BP 117/84   Pulse 68   Temp 98.9 F (37.2 C)   Resp 15   Ht 5\' 4"  (1.626 m)   Wt 117.9 kg (260 lb)   LMP 04/26/2018   SpO2 100%   BMI 44.63 kg/m   Physical Exam  Constitutional: She is oriented to person, place, and time. She appears well-developed and well-nourished.  Overweight; nad  HENT:  Head: Normocephalic and atraumatic.  Eyes: Conjunctivae are normal.  Neck: Neck supple.  Cardiovascular: Normal rate and regular rhythm.  Pulmonary/Chest: Effort normal and breath sounds normal.  Tender to palpation lower sternum  Abdominal: Soft. Bowel sounds are normal.  Musculoskeletal: Normal range of motion.  Neurological: She is alert and oriented to person, place, and time.  Skin: Skin is warm and dry.  Psychiatric: She has a normal mood and affect. Her behavior is normal.  Nursing note and vitals reviewed.    ED Treatments / Results  Labs (all labs ordered are listed, but only abnormal results are displayed) Labs Reviewed  BASIC METABOLIC PANEL - Abnormal; Notable for the following components:      Result Value   Glucose, Bld 118 (*)    All other components within normal limits  CBC  I-STAT TROPONIN, ED  I-STAT BETA HCG BLOOD, ED ([facility], [facility], [facility] ONLY)    EKG None  Radiology Dg Chest 2 View  Result Date: 05/18/2018 CLINICAL DATA:  Chest pain and shortness of breath. EXAM: CHEST - 2 VIEW COMPARISON:  None. FINDINGS: The heart size and mediastinal contours are within normal limits. Both lungs are clear. The visualized skeletal structures are unremarkable. IMPRESSION: No active cardiopulmonary disease. Electronically Signed   By: Titus Dubin M.D.   On: 05/18/2018 10:44    Procedures Procedures (including critical care time)  Medications Ordered in ED Medications - No data to display   Initial Impression / Assessment and Plan / ED Course  I have reviewed the triage vital signs and the nursing  notes.  Pertinent labs & imaging results that were available during my care of the patient were reviewed by me and considered in my medical decision making (see chart for details).     Patient presents with chest pain.  Her risk factor profile for CAD or PAD is low.  History is not suggestive of either.  Screening tests including labs, EKG, chest x-ray, troponin all negative.  Discussed with the patient and her daughters.  Final Clinical Impressions(s) / ED Diagnoses   Final diagnoses:  Chest pain, unspecified type    ED Discharge Orders    None       Nat Christen, MD 05/18/18 [phone]

## 2018-05-18 NOTE — ED Notes (Signed)
Pt discharged from ED; instructions provided; Pt encouraged to return to ED if symptoms worsen and to f/u with PCP; Pt verbalized understanding of all instructions 

## 2018-06-27 DIAGNOSIS — Z6841 Body Mass Index (BMI) 40.0 and over, adult: Secondary | ICD-10-CM | POA: Diagnosis not present

## 2018-06-27 DIAGNOSIS — Z01419 Encounter for gynecological examination (general) (routine) without abnormal findings: Secondary | ICD-10-CM | POA: Diagnosis not present

## 2018-08-23 ENCOUNTER — Ambulatory Visit: Payer: Commercial Managed Care - PPO | Admitting: Family Medicine

## 2018-08-23 ENCOUNTER — Encounter: Payer: Self-pay | Admitting: Family Medicine

## 2018-08-23 VITALS — BP 123/84 | HR 96 | Ht 64.25 in | Wt 269.0 lb

## 2018-08-23 DIAGNOSIS — I10 Essential (primary) hypertension: Secondary | ICD-10-CM | POA: Diagnosis not present

## 2018-08-23 DIAGNOSIS — Z6841 Body Mass Index (BMI) 40.0 and over, adult: Secondary | ICD-10-CM

## 2018-08-23 DIAGNOSIS — Z9109 Other allergy status, other than to drugs and biological substances: Secondary | ICD-10-CM

## 2018-08-23 MED ORDER — MONTELUKAST SODIUM 10 MG PO TABS: 10.0000 mg | ORAL_TABLET | Freq: Every day | ORAL | 1 refills | Status: DC

## 2018-08-23 MED ORDER — OLMESARTAN MEDOXOMIL-HCTZ 40-25 MG PO TABS: 1.0000 | ORAL_TABLET | Freq: Every day | ORAL | 1 refills | Status: DC

## 2018-08-23 NOTE — Progress Notes (Signed)
Impression and Recommendations:    1. Benign essential hypertension   2. BMI 45.0-49.9, adult ([place])   3. Environmental allergies    - If you would like you can also come in and be weighed by the medical assistant every week or every 2 weeks if you think that would help you in your journey.  - Goal in 4 weeks is to log everything you eat or drink and to lose it.  - Goal is to try to exercise 30 minutes 3 days a week or more  - Also goal is to drink more water- even just twice of what you are doing now.  BMI 45.0-49.9, adult [facility]) -Encouraged patient to drink more water to increase metabolism -Recommended pt use the Lose It or My Fitness Pal app, start paying attention to what you eat. -Discussed goal of 30 min of cardio 3x a week. -Discussed pt is able to come in to the office any time to check her weight -Encouraged pt to use a cooler to take food on long car drives.   Benign essential hypertension -Benicar refilled -Stable, well-controlled   Environmental Allergies -Singulair refilled; patient very happy with treatment and has never been better controlled with her symptoms -Stable, well-controlled    Education and routine counseling performed. Handouts provided.   Meds ordered this encounter  Medications  . olmesartan-hydrochlorothiazide (BENICAR HCT) 40-25 MG tablet    Sig: Take 1 tablet by mouth daily.    Dispense:  90 tablet    Refill:  1  . montelukast (SINGULAIR) 10 MG tablet    Sig: Take 1 tablet (10 mg total) by mouth at bedtime.    Dispense:  90 tablet    Refill:  1    Medications Discontinued During This Encounter  Medication Reason  . olmesartan-hydrochlorothiazide (BENICAR HCT) 40-25 MG tablet Change in therapy  . olmesartan-hydrochlorothiazide (BENICAR HCT) 20-12.5 MG tablet Change in therapy  . olmesartan-hydrochlorothiazide (BENICAR HCT) 40-25 MG tablet Reorder  . montelukast (SINGULAIR) 10 MG tablet Reorder     The patient was counseled,  risk factors were discussed, anticipatory guidance given.  Gross side effects, risk and benefits, and alternatives of medications discussed with patient.  Patient is aware that all medications have potential side effects and we are unable to predict every side effect or drug-drug interaction that may occur.  Expresses verbal understanding and consents to current therapy plan and treatment regimen.  Return for 28min wt loss OV.  Please see AVS handed out to patient at the end of our visit for further patient instructions/ counseling done pertaining to today's office visit.    Note:  This document was prepared using Dragon voice recognition software and may include unintentional dictation errors.   This document serves as a record of services personally performed by Mellody Dance, DO. It was created on her behalf by Wilburn Mylar, a trained medical scribe. The creation of this record is based on the scribe's personal observations and the provider's statements to them.   I have reviewed the above medical documentation for accuracy and completeness and I concur.  Mellody Dance 08/23/18 4:57 PM     Subjective:    HPI: Gina Farrell is a 47 y.o. female who presents to [facility] today for follow up of [name].  She notes feeling well overall.  She notes taking a trip to [place] next week.   HTN: - Stable -  Her blood pressure has been  controlled at home.  Pt has been checking it regularly. She reports it has been 120s/80s - Patient reports good compliance with blood pressure medications - Notes drinking 2-3 bottles of water per day - Lowered salt intake - She denies new onset of: chest pain, exercise intolerance, shortness of breath, dizziness, visual changes, headache, lower extremity swelling or claudication.    Obesity (BMI=45.8): - Patient has gained 11 lbs since her last visit in January 2019. - Struggles with diets that involve  cutting out foods - She currently walks 2-3 times a week.   Seasonal Allergies: -Improved and controlled with singulair   Last 3 blood pressure readings in our office are as follows: BP Readings from Last 3 Encounters:  08/23/18 123/84  05/18/18 117/84  12/20/17 126/84    Pulse Readings from Last 3 Encounters:  08/23/18 96  05/18/18 68  12/20/17 86    Filed Weights   08/23/18 1455  Weight: 269 lb (122 kg)      Patient Care Team    Relationship Specialty Notifications Start End  Mellody Dance, DO PCP - General Family Medicine  09/01/16   Arvella Nigh, MD Consulting Physician Obstetrics and Gynecology  09/01/16   Druscilla Brownie, MD Consulting Physician Dermatology  12/20/17      Lab Results  Component Value Date   CREATININE 0.78 05/18/2018   BUN 10 05/18/2018   NA 139 05/18/2018   K 3.6 05/18/2018   CL 104 05/18/2018   CO2 27 05/18/2018    Lab Results  Component Value Date   CHOL 184 12/20/2017   CHOL 194 12/06/2016   CHOL 179 01/19/2016    Lab Results  Component Value Date   HDL 43 12/20/2017   HDL 46 12/06/2016   HDL 43.10 01/19/2016    Lab Results  Component Value Date   LDLCALC 125 (H) 12/20/2017   LDLCALC 125 (H) 12/06/2016   LDLCALC 116 (H) 01/19/2016    Lab Results  Component Value Date   TRIG 80 12/20/2017   TRIG 115 12/06/2016   TRIG 96.0 01/19/2016    Lab Results  Component Value Date   CHOLHDL 4.3 12/20/2017   CHOLHDL 4.2 12/06/2016   CHOLHDL 4 01/19/2016    No results found for: LDLDIRECT ===================================================================   Patient Active Problem List   Diagnosis Date Noted  . BMI 40.0-44.9, adult ([place]) 04/17/2017    Priority: High  . Benign essential hypertension 02/27/2012    Priority: High  . Hypertriglyceridemia 09/01/2016    Priority: Medium  . Hyperlipidemia     Priority: Medium  . GERD (gastroesophageal reflux disease) 01/13/2015    Priority: Low  . Environmental  allergies     Priority: Low  . Vitamin D deficiency     Priority: Low  . h/o Asymptomatic microscopic hematuria- in GYN office 12/20/2017  . Encounter for wellness examination 12/20/2017  . Allergic rhinitis 09/18/2016  . H/O tinea corporis 09/01/2016  . Hypomagnesemia 09/01/2016     Past Medical History:  Diagnosis Date  . Allergic rhinitis   . Chicken pox   . GERD (gastroesophageal reflux disease)   . Hypertension   . Miscarriage 1998/2008  . Vitamin D deficiency   . Vitamin D deficiency      Past Surgical History:  Procedure Laterality Date  . CESAREAN SECTION     x 2  . d and c    . TUBAL LIGATION       Family History  Problem Relation Age of Onset  .  Hypertension Mother   . Stroke Mother   . Atrial fibrillation Mother   . Diabetes Mother   . Rheum arthritis Maternal Grandmother   . Coronary artery disease Maternal Grandmother   . Hypertension Maternal Grandmother   . Ulcerative colitis Father   . Cancer Neg Hx      Social History   Substance and Sexual Activity  Drug Use No  ,  Social History   Substance and Sexual Activity  Alcohol Use No  . Alcohol/week: 0.0 standard drinks   Comment: rare  ,  Social History   Tobacco Use  Smoking Status Former Smoker  . Packs/day: 0.50  . Years: 14.00  . Pack years: 7.00  . Last attempt to quit: 11/28/2001  . Years since quitting: 16.7  Smokeless Tobacco Never Used  ,    Current Outpatient Medications on File Prior to Visit  Medication Sig Dispense Refill  . aspirin 81 MG tablet Take 81 mg by mouth daily.    . Cholecalciferol (VITAMIN D3) 5000 units TABS Take 1 tablet by mouth daily.    . ciclopirox (LOPROX) 0.77 % cream Apply topically 2 (two) times daily. (Patient taking differently: Apply 1 application topically as needed. ) 15 g 0  . cyanocobalamin 500 MCG tablet Take 500 mcg by mouth daily.    . famciclovir (FAMVIR) 500 MG tablet Take 1 tablet (500 mg total) by mouth 3 (three) times daily.  (Patient taking differently: Take 500 mg by mouth as needed. ) 21 tablet 1  . fluticasone (FLONASE) 50 MCG/ACT nasal spray Place into both nostrils as needed.     . Multiple Vitamins-Minerals (MULTIVITAMIN PO) Take by mouth daily.    . Omega-3 Fatty Acids (FISH OIL) 1000 MG CAPS Take 100 mg by mouth daily.     No current facility-administered medications on file prior to visit.      Allergies  Allergen Reactions  . Red Yeast Rice [Cholestin]     Cramps/Heartburn     Review of Systems:   General:  Denies fever, chills Optho/Auditory:   Denies visual changes, blurred vision Respiratory:   Denies SOB, cough, wheeze, DIB  Cardiovascular:   Denies chest pain, palpitations, painful respirations Gastrointestinal:   Denies nausea, vomiting, diarrhea.  Endocrine:     Denies new hot or cold intolerance Musculoskeletal:  Denies joint swelling, gait issues, or new unexplained myalgias/ arthralgias Skin:  Denies rash, suspicious lesions  Neurological:    Denies dizziness, unexplained weakness, numbness  Psychiatric/Behavioral:   Denies mood changes  Objective:    Blood pressure 123/84, pulse 96, height 5' 4.25" (1.632 m), weight 269 lb (122 kg), SpO2 98 %.  Body mass index is 45.82 kg/m.  General: Well Developed, well nourished, and in no acute distress.  HEENT: Normocephalic, atraumatic, pupils equal round reactive to light, neck supple, no JVD Skin: Warm and dry, cap RF less 2 sec Cardiac: Regular rate and rhythm, S1, S2 WNL's, no murmurs rubs or gallops Respiratory: ECTA B/L, Not using accessory muscles, speaking in full sentences. NeuroM-Sk: Ambulates w/o assistance, moves ext * 4 w/o difficulty, sensation grossly intact.  Ext: scant edema b/l lower ext Psych: No HI/SI, judgement and insight good, Euthymic mood. Full Affect.

## 2018-08-23 NOTE — Patient Instructions (Addendum)
If you would like you can also come in and be weighed by the medical assistant every week or every 2 weeks if you think that would help you in your journey.  Goal in 4 weeks is to log everything you eat or drink and to lose it.  Goal is to try to exercise 30 minutes 3 days a week or more  Also goal is to drink more water- even just twice of what you are doing now.  Behavior Modification Ideas for Weight Management  Weight management involves adopting a healthy lifestyle that includes a knowledge of nutrition and exercise, a positive attitude and the right kind of motivation. Internal motives such as better health, increased energy, self-esteem and personal control increase your chances of lifelong weight management success.  Remember to have realistic goals and think long-term success. Believe in yourself and you can do it. The following information will give you ideas to help you meet your goals.  Control Your Home Environment  Eat only while sitting down at the kitchen or dining room table. Do not eat while watching television, reading, cooking, talking on the phone, standing at the refrigerator or working on the computer. Keep tempting foods out of the house - don't buy them. Keep tempting foods out of sight. Have low-calorie foods ready to eat. Unless you are preparing a meal, stay out of the kitchen. Have healthy snacks at your disposal, such as small pieces of fruit, vegetables, canned fruit, pretzels, low-fat string cheese and nonfat cottage cheese.  Control Your Work Environment  Do not eat at [employer] or keep tempting snacks at your desk. If you get hungry between meals, plan healthy snacks and bring them with you to work. During your breaks, go for a walk instead of eating. If you work around food, plan in advance the one item you will eat at mealtime. Make it inconvenient to nibble on food by chewing gum, sugarless candy or drinking water or another low-calorie beverage. Do  not work through meals. Skipping meals slows down metabolism and may result in overeating at the next meal. If food is available for special occasions, either pick the healthiest item, nibble on low-fat snacks brought from home, don't have anything offered, choose one option and have a small amount, or have only a beverage.  Control Your Mealtime Environment  Serve your plate of food at the stove or kitchen counter. Do not put the serving dishes on the table. If you do put dishes on the table, remove them immediately when finished eating. Fill half of your plate with vegetables, a quarter with lean protein and a quarter with starch. Use smaller plates, bowls and glasses. A smaller portion will look large when it is in a little dish. Politely refuse second helpings. When fixing your plate, limit portions of food to one scoop/serving or less.   Daily Food Management  Replace eating with another activity that you will not associate with food. Wait 20 minutes before eating something you are craving. Drink a large glass of water or diet soda before eating. Always have a big glass or bottle of water to drink throughout the day. Avoid high-calorie add-ons such as cream with your coffee, butter, mayonnaise and salad dressings.  Shopping: Do not shop when hungry or tired. Shop from a list and avoid buying anything that is not on your list. If you must have tempting foods, buy individual-sized packages and try to find a lower-calorie alternative. Don't taste test in the store.  Read food labels. Compare products to help you make the healthiest choices.  Preparation: Chew a piece of gum while cooking meals. Use a quarter teaspoon if you taste test your food. Try to only fix what you are going to eat, leaving yourself no chance for seconds. If you have prepared more food than you need, portion it into individual containers and freeze or refrigerate immediately. Don't snack while cooking  meals.  Eating: Eat slowly. Remember it takes about 20 minutes for your stomach to send a message to your brain that it is full. Don't let fake hunger make you think you need more. The ideal way to eat is to take a bite, put your utensil down, take a sip of water, cut your next bite, take a bit, put your utensil down and so on. Do not cut your food all at one time. Cut only as needed. Take small bites and chew your food well. Stop eating for a minute or two at least once during a meal or snack. Take breaks to reflect and have conversation.  Cleanup and Leftovers: Label leftovers for a specific meal or snack. Freeze or refrigerate individual portions of leftovers. Do not clean up if you are still hungry.  Eating Out and Social Eating  Do not arrive hungry. Eat something light before the meal. Try to fill up on low-calorie foods, such as vegetables and fruit, and eat smaller portions of the high-calorie foods. Eat foods that you like, but choose small portions. If you want seconds, wait at least 20 minutes after you have eaten to see if you are actually hungry or if your eyes are bigger than your stomach. Limit alcoholic beverages. Try a soda water with a twist of lime. Do not skip other meals in the day to save room for the special event.  At Restaurants: Order  la carte rather than buffet style. Order some vegetables or a salad for an appetizer instead of eating bread. If you order a high-calorie dish, share it with someone. Try an after-dinner mint with your coffee. If you do have dessert, share it with two or more people. Don't overeat because you do not want to waste food. Ask for a doggie bag to take extra food home. Tell the server to put half of your entree in a to go bag before the meal is served to you. Ask for salad dressing, gravy or high-fat sauces on the side. Dip the tip of your fork in the dressing before each bite. If bread is served, ask for only one piece. Try it  plain without butter or oil. At [employer] where oil and vinegar is served with bread, use only a small amount of oil and a lot of vinegar for dipping.  At a Friend[facility] House: Offer to bring a dish, appetizer or dessert that is low in calories. Serve yourself small portions or tell the host that you only want a small amount. Stand or sit away from the snack table. Stay away from the kitchen or stay busy if you are near the food. Limit your alcohol intake.  At [employer] and Cafeterias: Cover most of your plate with lettuce and/or vegetables. Use a salad plate instead of a dinner plate. After eating, clear away your dishes before having coffee or tea.  Entertaining at Home: Explore low-fat, low-cholesterol cookbooks. Use single-serving foods like chicken breasts or hamburger patties. Prepare low-calorie appetizers and desserts.   Holidays: Keep tempting foods out of sight. Decorate the house without  using food. Have low-calorie beverages and foods on hand for guests. Allow yourself one planned treat a day. Don't skip meals to save up for the holiday feast. Eat regular, planned meals.   Exercise Well  Make exercise a priority and a planned activity in the day. If possible, walk the entire or part of the distance to work. Get an exercise buddy. Go for a walk with a colleague during one of your breaks, go to the gym, run or take a walk with a friend, walk in the mall with a shopping companion. Park at the end of the parking lot and walk to the store or office entrance. Always take the stairs all of the way or at least part of the way to your floor. If you have a desk job, walk around the office frequently. Do leg lifts while sitting at your desk. Do something outside on the weekends like going for a hike or a bike ride.   Have a Healthy Attitude  Make health your weight management priority. Be realistic. Have a goal to achieve a healthier you, not necessarily the lowest  weight or ideal weight based on calculations or tables. Focus on a healthy eating style, not on dieting. Dieting usually lasts for a short amount of time and rarely produces long-term success. Think long term. You are developing new healthy behaviors to follow next month, in a year and in a decade.    This information is for educational purposes only and is not intended to replace the advice of your doctor or health care provider. We encourage you to discuss with your doctor any questions or concerns you may have.        Guidelines for Losing Weight   We want weight loss that will last so you should lose 1-2 pounds a week.  THAT IS IT! Please pick THREE things a month to change. Once it is a habit check off the item. Then pick another three items off the list to become habits.  If you are already doing a habit on the list GREAT!  Cross that item off!  Don't drink your calories. Ie, alcohol, soda, fruit juice, and sweet tea.   Drink more water. Drink a glass when you feel hungry or before each meal.   Eat breakfast - Complex carb and protein (likeDannon light and fit yogurt, oatmeal, fruit, eggs, [place] bacon).  Measure your cereal.  Eat no more than one cup a day. (ie Kashi)  Eat an apple a day.  Add a vegetable a day.  Try a new vegetable a month.  Use Pam! Stop using oil or butter to cook.  Don't finish your plate or use smaller plates.  Share your dessert.  Eat sugar free Jello for dessert or frozen grapes.  Don't eat 2-3 hours before bed.  Switch to whole wheat bread, pasta, and brown rice.  Make healthier choices when you eat out. No fries!  Pick baked chicken, NOT fried.  Don't forget to SLOW DOWN when you eat. It is not going anywhere.   Take the stairs.  Park far away in the parking lot  Lift soup cans (or weights) for 10 minutes while watching TV.  Walk at work for 10 minutes during break.  Walk outside 1 time a week with your friend, kids, dog, or  significant other.  Start a walking group at church.  Walk the mall as much as you can tolerate.   Keep a food diary.  Weigh yourself daily.  Walk for 15 minutes 3 days per week.  Cook at home more often and eat out less. If life happens and you go back to old habits, it is okay.  Just start over. You can do it!  If you experience chest pain, get short of breath, or tired during the exercise, please stop immediately and inform your doctor.    Before you even begin to attack a weight-loss plan, it pays to remember this: You are not fat. You have fat. Losing weight isn't about blame or shame; it's simply another achievement to accomplish. Dieting is like any other skill-you have to buckle down and work at it. As long as you act in a smart, reasonable way, you'll ultimately get where you want to be. Here are some weight loss pearls for you.   1. It's Not a Diet. It's a Lifestyle Thinking of a diet as something you're on and suffering through only for the short term doesn't work. To shed weight and keep it off, you need to make permanent changes to the way you eat. It's OK to indulge occasionally, of course, but if you cut calories temporarily and then revert to your old way of eating, you'll gain back the weight quicker than you can say yo-yo. Use it to lose it. Research shows that one of the best predictors of long-term weight loss is how many pounds you drop in the first month. For that reason, nutritionists often suggest being stricter for the first two weeks of your new eating strategy to build momentum. Cut out added sugar and alcohol and avoid unrefined carbs. After that, figure out how you can reincorporate them in a way that's healthy and maintainable.  2. There's a Right Way to Exercise Working out burns calories and fat and boosts your metabolism by building muscle. But those trying to lose weight are notorious for overestimating the number of calories they burn and underestimating the  amount they take in. Unfortunately, your system is biologically programmed to hold on to extra pounds and that means when you start exercising, your body senses the deficit and ramps up its hunger signals. If you're not diligent, you'll eat everything you burn and then some. Use it, to lose it. Cardio gets all the exercise glory, but strength and interval training are the real heroes. They help you build lean muscle, which in turn increases your metabolism and calorie-burning ability 3. Don't Overreact to Mild Hunger Some people have a hard time losing weight because of hunger anxiety. To them, being hungry is bad-something to be avoided at all costs-so they carry snacks with them and eat when they don't need to. Others eat because they're stressed out or bored. While you never want to get to the point of being ravenous (that's when bingeing is likely to happen), a hunger pang, a craving, or the fact that it's 3:00 p.m. should not send you racing for the vending machine or obsessing about the energy bar in your purse. Ideally, you should put off eating until your stomach is growling and it's difficult to concentrate.  Use it to lose it. When you feel the urge to eat, use the HALT method. Ask yourself, Am I really hungry? Or am I angry or anxious, lonely or bored, or tired? If you're still not certain, try the apple test. If you're truly hungry, an apple should seem delicious; if it doesn't, something else is going on. Or you can try drinking water and making yourself busy, if you  are still hungry try a healthy snack.  4. Not All Calories Are Created Equal The mechanics of weight loss are pretty simple: Take in fewer calories than you use for energy. But the kind of food you eat makes all the difference. Processed food that's high in saturated fat and refined starch or sugar can cause inflammation that disrupts the hormone signals that tell your brain you're full. The result: You eat a lot more.  Use it to  lose it. Clean up your diet. Swap in whole, unprocessed foods, including vegetables, lean protein, and healthy fats that will fill you up and give you the biggest nutritional bang for your calorie buck. In a few weeks, as your brain starts receiving regular hunger and fullness signals once again, you'll notice that you feel less hungry overall and naturally start cutting back on the amount you eat.  5. Protein, Produce, and Plant-Based Fats Are Your Weight-Loss Trinity Here's why eating the three Ps regularly will help you drop pounds. Protein fills you up. You need it to build lean muscle, which keeps your metabolism humming so that you can torch more fat. People in a weight-loss program who ate double the recommended daily allowance for protein (about 110 grams for a 150-pound woman) lost 70 percent of their weight from fat, while people who ate the RDA lost only about 40 percent, one study found. Produce is packed with filling fiber. "It's very difficult to consume too many calories if you're eating a lot of vegetables. Example: Three cups of broccoli is a lot of food, yet only 93 calories. (Fruit is another story. It can be easy to overeat and can contain a lot of calories from sugar, so be sure to monitor your intake.) Plant-based fats like olive oil and those in avocados and nuts are healthy and extra satiating.  Use it to lose it. Aim to incorporate each of the three Ps into every meal and snack. People who eat protein throughout the day are able to keep weight off, according to a study in the [employer]. In addition to meat, poultry and seafood, good sources are beans, lentils, eggs, tofu, and yogurt. As for fat, keep portion sizes in check by measuring out salad dressing, oil, and nut butters (shoot for one to two tablespoons). Finally, eat veggies or a little fruit at every meal. People who did that consumed 308 fewer calories but didn't feel any hungrier than when they  didn't eat more produce.  7. How You Eat Is As Important As What You Eat In order for your brain to register that you're full, you need to focus on what you're eating. Sit down whenever you eat, preferably at a table. Turn off the TV or computer, put down your phone, and look at your food. Smell it. Chew slowly, and don't put another bite on your fork until you swallow. When women ate lunch this attentively, they consumed 30 percent less when snacking later than those who listened to an audiobook at lunchtime, according to a study in the [place] [employer]. 8. Weighing Yourself Really Works The scale provides the best evidence about whether your efforts are paying off. Seeing the numbers tick up or down or stagnate is motivation to keep going-or to rethink your approach. A 2015 study at [facility] found that daily weigh-ins helped people lose more weight, keep it off, and maintain that loss, even after two years. Use it to lose it. Step on the  scale at the same time every day for the best results. If your weight shoots up several pounds from one weigh-in to the next, don't freak out. Eating a lot of salt the night before or having your period is the likely culprit. The number should return to normal in a day or two. It's a steady climb that you need to do something about. 9. Too Much Stress and Too Little Sleep Are Your Enemies When you're tired and frazzled, your body cranks up the production of cortisol, the stress hormone that can cause carb cravings. Not getting enough sleep also boosts your levels of ghrelin, a hormone associated with hunger, while suppressing leptin, a hormone that signals fullness and satiety. People on a diet who slept only five and a half hours a night for two weeks lost 55 percent less fat and were hungrier than those who slept eight and a half hours, according to a study in the [employer]. Use it to lose it. Prioritize sleep, aiming for  seven hours or more a night, which research shows helps lower stress. And make sure you're getting quality zzz's. If a snoring spouse or a fidgety cat wakes you up frequently throughout the night, you may end up getting the equivalent of just four hours of sleep, according to a study from [facility]. Keep pets out of the bedroom, and use a white-noise app to drown out snoring. 10. You Will Hit a plateau-And You Can Bust Through It As you slim down, your body releases much less leptin, the fullness hormone.  If you're not strength training, start right now. Building muscle can raise your metabolism to help you overcome a plateau. To keep your body challenged and burning calories, incorporate new moves and more intense intervals into your workouts or add another sweat session to your weekly routine. Alternatively, cut an extra 100 calories or so a day from your diet. Now that you've lost weight, your body simply doesn't need as much fuel.    Since food equals calories, in order to lose weight you must either eat fewer calories, exercise more to burn off calories with activity, or both. Food that is not used to fuel the body is stored as fat. A major component of losing weight is to make smarter food choices. Here's how:  1)   Limit non-nutritious foods, such as: Sugar, honey, syrups and candy Pastries, donuts, pies, cakes and cookies Soft drinks, sweetened juices and alcoholic beverages  2)  Cut down on high-fat foods by: - Choosing poultry, fish or lean red meat - Choosing low-fat cooking methods, such as baking, broiling, steaming, grilling and boiling - Using low-fat or non-fat dairy products - Using vinaigrette, herbs, lemon or fat-free salad dressings - Avoiding fatty meats, such as bacon, sausage, franks, ribs and luncheon meats - Avoiding high-fat snacks like nuts, chips and chocolate - Avoiding fried foods - Using less butter, margarine, oil and mayonnaise - Avoiding high-fat  gravies, cream sauces and cream-based soups  3) Eat a variety of foods, including: - Fruit and vegetables that are raw, steamed or baked - Whole grains, breads, cereal, rice and pasta - Dairy products, such as low-fat or non-fat milk or yogurt, low-fat cottage cheese and low-fat cheese - Protein-rich foods like chicken, [place], fish, lean meat and legumes, or beans  4) Change your eating habits by: - Eat three balanced meals a day to help control your hunger - Watch portion sizes and eat small servings of  a variety of foods - Choose low-calorie snacks - Eat only when you are hungry and stop when you are satisfied - Eat slowly and try not to perform other tasks while eating - Find other activities to distract you from food, such as walking, taking up a hobby or being involved in the community - Include regular exercise in your daily routine ( minimum of 20 min of moderate-intensity exercise at least 5 days/week)  - Find a support group, if necessary, for emotional support in your weight loss journey           Easy ways to cut 100 calories   1. Eat your eggs with hot sauce OR salsa instead of cheese.  Eggs are great for breakfast, but many people consider eggs and cheese to be BFFs. Instead of cheese-1 oz. of cheddar has 114 calories-top your eggs with hot sauce, which contains no calories and helps with satiety and metabolism. Salsa is also a great option!!  2. Top your toast, waffles or pancakes with fresh berries instead of jelly or syrup. Half a cup of berries-fresh, frozen or thawed-has about 40 calories, compared with 2 tbsp. of maple syrup or jelly, which both have about 100 calories. The berries will also give you a good punch of fiber, which helps keep you full and satisfied and won't spike blood sugar quickly like the jelly or syrup. 3. Swap the non-fat latte for black coffee with a splash of half-and-half. Contrary to its name, that non-fat latte has 130 calories and a  startling 19g of carbohydrates per 16 oz. serving. Replacing that 'light' drinkable dessert with a black coffee with a splash of half-and-half saves you more than 100 calories per 16 oz. serving. 4. Sprinkle salads with freeze-dried raspberries instead of dried cranberries. If you want a sweet addition to your nutritious salad, stay away from dried cranberries. They have a whopping 130 calories per  cup and 30g carbohydrates. Instead, sprinkle freeze-dried raspberries guilt-free and save more than 100 calories per  cup serving, adding 3g of belly-filling fiber. 5. Go for mustard in place of mayo on your sandwich. Mustard can add really nice flavor to any sandwich, and there are tons of varieties, from spicy to honey. A serving of mayo is 95 calories, versus 10 calories in a serving of mustard.  Or try an avocado mayo spread: You can find the recipe few click this link: https://www.californiaavocado.com/recipes/recipe-container/[place]-avocado-mayo 6. Choose a DIY salad dressing instead of the store-bought kind. Mix Dijon or whole grain mustard with low-fat Kefir or red wine vinegar and garlic. 7. Use hummus as a spread instead of a dip. Use hummus as a spread on a high-fiber cracker or tortilla with a sandwich and save on calories without sacrificing taste. 8. Pick just one salad "accessory." Salad isn't automatically a calorie winner. It's easy to over-accessorize with toppings. Instead of topping your salad with nuts, avocado and cranberries (all three will clock in at 313 calories), just pick one. The next day, choose a different accessory, which will also keep your salad interesting. You don't wear all your jewelry every day, right? 9. Ditch the white pasta in favor of spaghetti squash. One cup of cooked spaghetti squash has about 40 calories, compared with traditional spaghetti, which comes with more than 200. Spaghetti squash is also nutrient-dense. It's a good source of fiber and Vitamins A  and C, and it can be eaten just like you would eat pasta-with a great tomato sauce and [place] meatballs or  with pesto, tofu and spinach, for example. 10. Dress up your chili, soups and stews with non-fat [place] yogurt instead of sour cream. Just a 'dollop' of sour cream can set you back 115 calories and a whopping 12g of fat-seven of which are of the artery-clogging variety. Added bonus: [place] yogurt is packed with muscle-building protein, calcium and B Vitamins. 11. Mash cauliflower instead of mashed potatoes. One cup of traditional mashed potatoes-in all their creamy goodness-has more than 200 calories, compared to mashed cauliflower, which you can typically eat for less than 100 calories per 1 cup serving. Cauliflower is a great source of the antioxidant indole-3-carbinol (I3C), which may help reduce the risk of some cancers, like breast cancer. 12. Ditch the ice cream sundae in favor of a [place] yogurt parfait. Instead of a cup of ice cream or fro-yo for dessert, try 1 cup of nonfat Greek yogurt topped with fresh berries and a sprinkle of cacao nibs. Both toppings are packed with antioxidants, which can help reduce cellular inflammation and oxidative damage. And the comparison is a no-brainer: One cup of ice cream has about 275 calories; one cup of frozen yogurt has about 230; and a cup of Greek yogurt has just 130, plus twice the protein, so you're less likely to return to the freezer for a second helping. 13. Put olive oil in a spray container instead of using it directly from the bottle. Each tablespoon of olive oil is 120 calories and 15g of fat. Use a mister instead of pouring it straight into the pan or onto a salad. This allows for portion control and will save you more than 100 calories. 14. When baking, substitute canned pumpkin for butter or oil. Canned pumpkin-not pumpkin pie mix-is loaded with Vitamin A, which is important for skin and eye health, as well as immunity. And the comparisons are  pretty crazy:  cup of canned pumpkin has about 40 calories, compared to butter or oil, which has more than 800 calories. Yes, 800 calories. Applesauce and mashed banana can also serve as good substitutions for butter or oil, usually in a 1:1 ratio. 15. Top casseroles with high-fiber cereal instead of breadcrumbs. Breadcrumbs are typically made with white bread, while breakfast cereals contain 5-9g of fiber per serving. Not only will you save more than 150 calories per  cup serving, the swap will also keep you more full and you'll get a metabolism boost from the added fiber. 16. Snack on pistachios instead of macadamia nuts. Believe it or not, you get the same amount of calories from 35 pistachios (100 calories) as you would from only five macadamia nuts. 17. Chow down on kale chips rather than potato chips. This is my favorite 'don't knock it 'till you try it' swap. Kale chips are so easy to make at home, and you can spice them up with a little grated parmesan or chili powder. Plus, they're a mere fraction of the calories of potato chips, but with the same crunch factor we crave so often. 18. Add seltzer and some fruit slices to your cocktail instead of soda or fruit juice. One cup of soda or fruit juice can pack on as much as 140 calories. Instead, use seltzer and fruit slices. The fruit provides valuable phytochemicals, such as flavonoids and anthocyanins, which help to combat cancer and stave off the aging process.

## 2018-09-24 ENCOUNTER — Ambulatory Visit: Payer: Commercial Managed Care - PPO | Admitting: Family Medicine

## 2018-10-02 ENCOUNTER — Ambulatory Visit
Admission: RE | Admit: 2018-10-02 | Discharge: 2018-10-02 | Disposition: A | Discharge status: Home / Self Care | Payer: Commercial Managed Care - PPO | Source: Ambulatory Visit | Attending: Obstetrics and Gynecology | Admitting: Obstetrics and Gynecology

## 2018-10-02 DIAGNOSIS — N6012 Diffuse cystic mastopathy of left breast: Secondary | ICD-10-CM | POA: Diagnosis not present

## 2018-10-02 DIAGNOSIS — N6320 Unspecified lump in the left breast, unspecified quadrant: Secondary | ICD-10-CM

## 2018-10-02 DIAGNOSIS — R928 Other abnormal and inconclusive findings on diagnostic imaging of breast: Secondary | ICD-10-CM | POA: Diagnosis not present

## 2018-11-27 ENCOUNTER — Other Ambulatory Visit: Payer: Self-pay | Admitting: Adult Health

## 2018-11-27 ENCOUNTER — Telehealth: Payer: Self-pay | Admitting: Family Medicine

## 2018-11-27 MED ORDER — OSELTAMIVIR PHOSPHATE 75 MG PO CAPS: 75.0000 mg | ORAL_CAPSULE | Freq: Two times a day (BID) | ORAL | 0 refills | Status: DC

## 2018-11-27 NOTE — Telephone Encounter (Signed)
Afternoon Baker Janus, Can you please call Ms. Monier and share- I sent in [place] Rx Please tell her to increase fluids/rest Alternate OTC Acetaminophen and Ibuprofen as needed for fever/discomfort. Thanks! Valetta Fuller

## 2018-11-27 NOTE — Telephone Encounter (Signed)
Pt's husband called (he tested ( positive+) for flu yesterday @ OV with Valetta Fuller, now wife/Gina Farrell has same symptoms and is at home sick today--- Patient wonders if provider can call / send in Rx for her to :   [place], [place] - [place] [phone] (Phone) [phone] (Fax)   Please call them at home if possible to let them know if approved.  --glh

## 2018-11-27 NOTE — Telephone Encounter (Signed)
Last eGFR was 12/20/17 and was 94.  Please review and send in RX for Tamiflu if appropriate.  Charyl Bigger, CMA

## 2018-12-07 ENCOUNTER — Encounter: Payer: Self-pay | Admitting: Family Medicine

## 2018-12-07 ENCOUNTER — Ambulatory Visit: Payer: 59 | Admitting: Family Medicine

## 2018-12-07 VITALS — BP 133/90 | HR 98 | Temp 98.1°F | Ht 64.0 in | Wt 260.0 lb

## 2018-12-07 DIAGNOSIS — M76899 Other specified enthesopathies of unspecified lower limb, excluding foot: Secondary | ICD-10-CM | POA: Insufficient documentation

## 2018-12-07 DIAGNOSIS — M24552 Contracture, left hip: Secondary | ICD-10-CM | POA: Insufficient documentation

## 2018-12-07 DIAGNOSIS — M7062 Trochanteric bursitis, left hip: Secondary | ICD-10-CM

## 2018-12-07 DIAGNOSIS — M62830 Muscle spasm of back: Secondary | ICD-10-CM | POA: Diagnosis not present

## 2018-12-07 DIAGNOSIS — I10 Essential (primary) hypertension: Secondary | ICD-10-CM

## 2018-12-07 DIAGNOSIS — Z6841 Body Mass Index (BMI) 40.0 and over, adult: Secondary | ICD-10-CM

## 2018-12-07 DIAGNOSIS — B354 Tinea corporis: Secondary | ICD-10-CM | POA: Insufficient documentation

## 2018-12-07 DIAGNOSIS — E785 Hyperlipidemia, unspecified: Secondary | ICD-10-CM

## 2018-12-07 DIAGNOSIS — G8929 Other chronic pain: Secondary | ICD-10-CM | POA: Insufficient documentation

## 2018-12-07 NOTE — Progress Notes (Signed)
Impression and Recommendations:    1. Greater trochanteric bursitis of left hip   2. Hip flexor tendinitis, unspecified laterality   3. Hip flexor tendon tightness, left   4. Muscle spasm of back- thoracic and lumbar L > er R   5. Tinea corporis   6. BMI 40.0-44.9, adult ([place])   7. Benign essential hypertension      Greater trochanteric bursitis of left hip  Hip flexor tendinitis, unspecified laterality  Hip flexor tendon tightness, left  Muscle spasm of back- thoracic and lumbar L > er R  Tinea corporis  BMI 40.0-44.9, adult ([facility])  Benign essential hypertension  1. Hypertension - Blood pressure remains controlled at this time. - Continue management on medications as established.  See med list below. -  Patient tolerating meds well without complication.  Denies S-E  - Reviewed that goal blood pressure is 130/80 or less, 135/85 or less.  - Lifestyle changes such as dash diet and engaging in a regular exercise program discussed with patient.  - Ambulatory BP monitoring encouraged. Keep log and bring in next OV.  2. Greater Trochanteric Bursitis of Left Hip, Hip Flexor Tendinitis, Muscle Spasm of Back - Reviewed sources of symptoms with patient today.  - Demonstrated several techniques to help gently stretch and release tension in her areas of complaint. - Extensively reviewed prudent stretching and deep breathing in office today. - Discussed that she should never do a stretch that causes pain; just the sensation of stretching.  - Ice the area of bursitis, 20 minutes at a time, once per hour.   - If icing the area does not alleviate the pain, patient knows she may return PRN for steroid injection.  - Avoid sitting for long periods of time. - Avoid repetitive motions in the area. - Avoid lying down on the aggravated area.  - Discussed that regular walking is okay, but nothing that aggravates the area.   - Practice prudent use of ibuprofen and alleve.  -  Patient desires a doctor's note for a stand-up desk.  3. Itching Skin - Tinea Corporis - Advised patient to use Lamisil terbinafine cream OTC twice daily.  - In the area of symptoms and complaint, patient knows to apply antifungal powder to keep the area dry.  - Strongly advised patient to keep the area dry.  - Will continue to monitor.  4. BMI Counseling - Body mass index is 44.63 kg/m. Explained to patient what BMI refers to, and what it means medically.    Told patient to think about it as a "medical risk stratification measurement" and how increasing BMI is associated with increasing risk/ or worsening state of various diseases such as hypertension, hyperlipidemia, diabetes, premature OA, depression etc.  American Heart Association guidelines for healthy diet, basically Mediterranean diet, and exercise guidelines of 30 minutes 5 days per week or more discussed in detail.  Health counseling performed.  All questions answered.  5. Weight Loss Goals - Reviewed nutrition tracking during appointment today.  - Discussed appropriate distribution of macronutrients.  Encouraged patient to aim for a daily nutrition distribution of 50% protein, 30% carbs, and 20% fats.  - Extensive education was provided.  All questions were answered.  - Discussed protein-rich diet, prudent meal ideas, adequate hydration, and various other suggestions to help patient achieve her weight loss goals.  - Encouraged patient to begin a regular exercise routine, including cardiovascular activity as many days as tolerated, and weight lifting/resistance training two days per  week.  - Patient knows she may come in every 4 weeks for weight loss follow-up PRN.  6. Lifestyle & Preventative Health Maintenance - Advised patient to continue working toward exercising to improve overall mental, physical, and emotional health.    - Reviewed the "spokes of the wheel" of mood and health management.  Stressed the importance of  ongoing prudent habits, including regular exercise, appropriate sleep hygiene, healthful dietary habits, and prayer/meditation to relax.  - Encouraged patient to engage in daily physical activity, especially a formal exercise routine.  Recommended that the patient eventually strive for at least 150 minutes of moderate cardiovascular activity per week according to guidelines established by the [facility].   - Healthy dietary habits encouraged, including low-carb, and high amounts of lean protein in diet.   - Patient should also consume adequate amounts of water.  Pt was interviewed and evaluated by me in the clinic today for 32.5+ minutes, with over 50% time spent in face to face counseling of patients various medical conditions, treatment plans of those medical conditions including medicine management and lifestyle modification, strategies to improve health and well being; and in coordination of care. SEE ABOVE TREATMENT PLAN FOR DETAILS    No orders of the defined types were placed in this encounter.   No orders of the defined types were placed in this encounter.   Medications Discontinued During This Encounter  Medication Reason  . cyanocobalamin 500 MCG tablet Patient Preference  . famciclovir (FAMVIR) 500 MG tablet   . oseltamivir (TAMIFLU) 75 MG capsule Completed Course     Gross side effects, risk and benefits, and alternatives of medications and treatment plan in general discussed with patient.  Patient is aware that all medications have potential side effects and we are unable to predict every side effect or drug-drug interaction that may occur.   Patient will call with any questions prior to using medication if they have concerns.    Expresses verbal understanding and consents to current therapy and treatment regimen.  No barriers to understanding were identified.  Red flag symptoms and signs discussed in detail.  Patient expressed understanding regarding what to do in case of  emergency\urgent symptoms  Please see AVS handed out to patient at the end of our visit for further patient instructions/ counseling done pertaining to today's office visit.   Return for wt loss appt 4 wks- 8 lb goal.  GREAT JOB- keep it up!!!.     Note:  This note was prepared with assistance of Dragon voice recognition software. Occasional wrong-word or sound-a-like substitutions may have occurred due to the inherent limitations of voice recognition software.   This document serves as a record of services personally performed by Mellody Dance, DO. It was created on her behalf by Toni Amend, a trained medical scribe. The creation of this record is based on the scribe's personal observations and the provider's statements to them.   I have reviewed the above medical documentation for accuracy and completeness and I concur.  Mellody Dance, DO 12/08/2018 3:35 PM        -------------------------------------------------------------------------------------------------------------------------------------    Subjective:     HPI: Gina Farrell is a 48 y.o. female who presents to [place] at [facility] today for issues as discussed below.  Patient largely denies chronic complaints.   Skin Itching - Tinea Infection Had C-section ten years ago, and when the C-section was finished, she developed an infection in the area of surgery.  Notes she recently  started getting itchy again "in the area of infection."  Patient states that she is diligent about keeping the area clean and dry.  The symptoms felt initially itchy, then burning and irritated.  Patient was reassured in office today that the symptoms are consistent with tinea corporis.  Weight Loss Goals She has lost nine pounds since October.  Was getting ready to go to [place] at that time, and thought about starting the [employer].  Since January first, she started with her weight loss goals.  She set her  calories up to lose two pounds per week.  "For the most part, except for maybe a day, I'm staying at or under my calorie count."  Patient eats pickles and cherry tomatoes and hummus as her daily snack.  The patient's entire family is engaged in this weight loss journey together.  Discussed various ways that the family is engaging in more prudent habits, such as eating cauliflower mashed potatoes and eliminating soda from their diet.  They weigh themselves daily, write their weight down in their 2020 devotional, and have a plan to look back and see what they have accomplished.  Chronic Left Hip Pain & Back Pain Started noticing a couple of months ago she would wake up at night time with her left hip hurting.  The pain would cause her to need to flip over in the bed.  "This was just kind of an annoyance."  Then she began to notice while lounging on the couch in the evening, she would favor her left side and have to readjust.  Confirms that her left hip hurts the most when she applies pressure to it.  Confirms that she was in [place] before this pain started.  Back in October, on their trip, they were walking nine miles per day for five days straight.  They went to all four parks.  When she came back from [place] in October, that's when the pain started.  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  When she's standing or walking, her hip doesn't hurt.  When she gets out of the shower to put her underwear on, she notices the pain again.  Some days, like yesterday, she notices her back hurts in addition to her hip  1. HTN HPI:  -  Her blood pressure has been controlled at home.  Pt is checking it at home.   States that her bottom number is never over 90 at home.  It runs around 126/82, or 130/88, etc.  - Patient reports good compliance with blood pressure medications.  - Denies medication S-E   - Smoking Status noted   - She  denies new onset of: chest pain, exercise intolerance, shortness of breath, dizziness, visual changes, headache, lower extremity swelling or claudication.   Last 3 blood pressure readings in our office are as follows: BP Readings from Last 3 Encounters:  12/07/18 133/90  08/23/18 123/84  05/18/18 117/84    Filed Weights   12/07/18 0813  Weight: 260 lb (117.9 kg)    Wt Readings from Last 3 Encounters:  12/07/18 260 lb (117.9 kg)  08/23/18 269 lb (122 kg)  05/18/18 260 lb (117.9 kg)   BP Readings from Last 3 Encounters:  12/07/18 133/90  08/23/18 123/84  05/18/18 117/84   Pulse Readings from Last 3 Encounters:  12/07/18 98  08/23/18 96  05/18/18 68   BMI Readings from Last  3 Encounters:  12/07/18 44.63 kg/m  08/23/18 45.82 kg/m  05/18/18 44.63 kg/m     Patient Care Team    Relationship Specialty Notifications Start End  Mellody Dance, DO PCP - General Family Medicine  09/01/16   Arvella Nigh, MD Consulting Physician Obstetrics and Gynecology  09/01/16   Druscilla Brownie, MD Consulting Physician Dermatology  12/20/17      Patient Active Problem List   Diagnosis Date Noted  . Greater trochanteric bursitis of left hip 12/07/2018  . Hip flexor tendinitis 12/07/2018  . Hip flexor tendon tightness, left 12/07/2018  . Muscle spasm of back- thoracic and lumbar L > er R 12/07/2018  . Tinea corporis 12/07/2018  . h/o Asymptomatic microscopic hematuria- in GYN office 12/20/2017  . Encounter for wellness examination 12/20/2017  . BMI 40.0-44.9, adult ([facility]) 04/17/2017  . Allergic rhinitis 09/18/2016  . H/O tinea corporis 09/01/2016  . Hypomagnesemia 09/01/2016  . Hypertriglyceridemia 09/01/2016  . GERD (gastroesophageal reflux disease) 01/13/2015  . Environmental allergies   . Hyperlipidemia   . Vitamin D deficiency   . Benign essential hypertension 02/27/2012    Past Medical history, Surgical history, Family history, Social history, Allergies and Medications  have been entered into the medical record, reviewed and changed as needed.    Current Meds  Medication Sig  . aspirin 81 MG tablet Take 81 mg by mouth daily.  . Cholecalciferol (VITAMIN D3) 5000 units TABS Take 1 tablet by mouth daily.  . ciclopirox (LOPROX) 0.77 % cream Apply topically 2 (two) times daily. (Patient taking differently: Apply 1 application topically as needed. )  . fluticasone (FLONASE) 50 MCG/ACT nasal spray Place into both nostrils as needed.   . montelukast (SINGULAIR) 10 MG tablet Take 1 tablet (10 mg total) by mouth at bedtime.  . Multiple Vitamins-Minerals (MULTIVITAMIN PO) Take by mouth daily.  Marland Kitchen olmesartan-hydrochlorothiazide (BENICAR HCT) 40-25 MG tablet Take 1 tablet by mouth daily.  . Omega-3 Fatty Acids (FISH OIL) 1000 MG CAPS Take 100 mg by mouth daily.    Allergies:  Allergies  Allergen Reactions  . Red Yeast Rice [Cholestin]     Cramps/Heartburn     Review of Systems:  A fourteen system review of systems was performed and found to be positive as per HPI.   Objective:   Blood pressure 133/90, pulse 98, temperature 98.1 F (36.7 C), height 5\' 4"  (1.626 m), weight 260 lb (117.9 kg), last menstrual period 11/19/2018, SpO2 99 %. Body mass index is 44.63 kg/m. General:  Well Developed, well nourished, appropriate for stated age.  Neuro:  Alert and oriented,  extra-ocular muscles intact  HEENT:  Normocephalic, atraumatic, neck supple, no carotid bruits appreciated  Skin: On lower abddomin in crease of pannis is erythematous papular rash, moist-appearing, with some confluence, otherwise WNL, warm, pink. Cardiac:  RRR, S1 S2 Respiratory:  ECTA B/L and A/P, Not using accessory muscles, speaking in full sentences- unlabored. Vascular:  Ext warm, no cyanosis apprec.; cap RF less 2 sec. Psych:  No HI/SI, judgement and insight good, Euthymic mood. Full Affect. Musculoskeletal:  On the left, her T10 to L2 paravertebral muscles are extremely tight and boggy,  with multiple trigger points, worse on the left than right (L > R).

## 2018-12-07 NOTE — Patient Instructions (Addendum)
Great job girl- keep it up!!!!!    Please look into Muscle Milk Light as snacks thru-out day.   Use Lamisil (terbinafine cream) twice daily.  Use antifungal powder in the area to keep it dry otherwise, open to air as much as possible  Please utilize and remember the three hip flexion type exercises we demonstrated in office today.  Implement these stretches at home, along with the back stretch from the forearms/ on your belly and then eventually extending arms as tolerated.         Trochanteric Bursitis Trochanteric bursitis is a condition that causes hip pain. Trochanteric bursitis happens when fluid-filled sacs (bursae) in the hip get irritated. Normally these sacs absorb shock and help strong bands of tissue (tendons) in your hip glide smoothly over each other and over your hip bones. What are the causes? This condition results from increased friction between the hip bones and the tendons that go over them. This condition can happen if you:  Have weak hips.  Use your hip muscles too much (overuse).  Get hit in the hip. What increases the risk? This condition is more likely to develop in:  Women.  Adults who are middle-aged or older.  People with arthritis or a spinal condition.  People with weak buttocks muscles (gluteal muscles).  People who have one leg that is shorter than the other.  People who participate in certain kinds of athletic activities, such as: ? Running sports, especially long-distance running. ? Contact sports, like football or martial arts. ? Sports in which falls may occur, like skiing. What are the signs or symptoms? The main symptom of this condition is pain and tenderness over the point of your hip. The pain may be:  Sharp and intense.  Dull and achy.  Felt on the outside of your thigh. It may increase when you:  Lie on your side.  Walk or run.  Go up on stairs.  Sit.  Stand up after sitting.  Stand for long periods of time. How  is this diagnosed? This condition may be diagnosed based on:  Your symptoms.  Your medical history.  A physical exam.  Imaging tests, such as: ? X-rays to check your bones. ? An MRI or ultrasound to check your tendons and muscles. During your physical exam, your health care provider will check the movement and strength of your hip. He or she may press on the point of your hip to check for pain. How is this treated? This condition may be treated by:  Resting.  Reducing your activity.  Avoiding activities that cause pain.  Using crutches, a cane, or a walker to decrease the strain on your hip.  Taking medicine to help with swelling.  Having medicine injected into the bursae to help with swelling.  Using ice, heat, and massage therapy for pain relief.  Physical therapy exercises for strength and flexibility.  Surgery (rare). Follow these instructions at home: Activity  Rest.  Avoid activities that cause pain.  Return to your normal activities as told by your health care provider. Ask your health care provider what activities are safe for you. Managing pain, stiffness, and swelling  Take over-the-counter and prescription medicines only as told by your health care provider.  If directed, apply heat to the injured area as told by your health care provider. ? Place a towel between your skin and the heat source. ? Leave the heat on for 20-30 minutes. ? Remove the heat if your skin turns bright  red. This is especially important if you are unable to feel pain, heat, or cold. You may have a greater risk of getting burned.  If directed, apply ice to the injured area: ? Put ice in a plastic bag. ? Place a towel between your skin and the bag. ? Leave the ice on for 20 minutes, 2-3 times a day. General instructions  If the affected leg is one that you use for driving, ask your health care provider when it is safe to drive.  Use crutches, a cane, or a walker as told by your  health care provider.  If one of your legs is shorter than the other, get fitted for a shoe insert.  Lose weight if you are overweight. How is this prevented?  Wear supportive footwear that is appropriate for your sport.  If you have hip pain, start any new exercise or sport slowly.  Maintain physical fitness, including: ? Strength. ? Flexibility. Contact a health care provider if:  Your pain does not improve with 2-4 weeks. Get help right away if:  You develop severe pain.  You have a fever.  You develop increased redness over your hip.  You have a change in your bowel function or bladder function.  You cannot control the muscles in your feet. This information is not intended to replace advice given to you by your health care provider. Make sure you discuss any questions you have with your health care provider. Document Released: 12/22/2004 Document Revised: 07/20/2016 Document Reviewed: 10/30/2015 Elsevier Interactive Patient Education  2019 [place] Syndrome  Gluteus medius syndrome causes pain on the outside of your hip due to repeated overstretching or overworking of the gluteus medius muscle. This muscle runs from the top, outer part of your pelvis to the top of your thigh bone (femur). This muscle helps you lift your leg to the side and rotate your leg. It also keeps your hip stable and aligned when you are standing, walking, or running. What are the causes? This condition is caused by small injuries to the gluteus medius muscle over time.  It happens from repetitive movements or a sudden increase in the amount or intensity of activity that involves the legs.  It starts with muscle inflammation and may lead to small tears and scarring in your muscle. What increases the risk? You are more likely to develop this condition if you:  Run on soft or uneven surfaces, such as sand or grass.  Have weakness in your hips and core muscles.  Run  long distances.  Increase your time, distance, or intensity of a sport over a short period of time. What are the signs or symptoms? The main symptom of this condition is pain on the outside of your hip with activity.  Usually, the pain will: ? Increase the longer you play sports or run. ? Decrease with rest.  Your hip may also be tender to the touch and you may have muscle spasms. How is this diagnosed? This condition is diagnosed based on your symptoms, medical history, and physical exam.  During the exam, your health care provider will touch different areas of your hip to test for pain.  You may also have imaging studies, such as X-rays and MRI, to rule out other causes of pain. How is this treated? This condition may be treated by a combination of treatments:  Decreasing mileage or time spent doing sports.  Having a coach help you with your running  form.  Stretching or strengthening exercises (physical therapy).  Ice or heat therapy.  Massage.  Local electrical stimulation (transcutaneous electrical nerve stimulation, TENS).  Trigger point injection. A steroid or numbing medicine is injected in the area where you have pain. Follow these instructions at home: Managing pain, stiffness, and swelling      If directed, put ice on the injured area. ? Put ice in a plastic bag. ? Place a towel between your skin and the bag. ? Leave the ice on for 20 minutes, 2-3 times a day.  If directed, apply heat to the affected area before you exercise. Use the heat source that your health care provider recommends, such as a moist heat pack or a heating pad. ? Place a towel between your skin and the heat source. ? Leave the heat on for 20-30 minutes. ? Remove the heat if your skin turns bright red. This is especially important if you are unable to feel pain, heat, or cold. You may have a greater risk of getting burned. Activity  Return to your normal activities as told by your health  care provider. Ask your health care provider what activities are safe for you.  Do exercises as told by your physical therapist.  Try not to lie on your painful side. When lying on your other side, put a pillow between your knees to decrease strain on your top hip muscles. General instructions  Take over-the-counter and prescription medicines only as told by your health care provider.  Keep all follow-up visits as told by your health care provider. This is important. How is this prevented?  Warm up and stretch before being active.  Cool down and stretch after being active.  Give your body time to rest between periods of activity.  Include a variety of exercises and activities in your routine to avoid overuse injuries.  Maintain physical fitness, including: ? Strength. ? Flexibility. ? Cardiovascular fitness. ? Endurance. Contact a health care provider if:  Your pain does not get better or it gets worse. Summary  Gluteus medius syndrome causes pain on the outside of your hip due to repeated overstretching or overworking of the gluteus medius muscle.  This condition is caused by small injuries to the gluteus medius muscle over time.  Treatments may include physical therapy, massage, and trigger point injection. This information is not intended to replace advice given to you by your health care provider. Make sure you discuss any questions you have with your health care provider. Document Released: 11/14/2005 Document Revised: 04/16/2018 Document Reviewed: 04/16/2018 Elsevier Interactive Patient Education  2019 [employer].

## 2018-12-24 ENCOUNTER — Ambulatory Visit: Payer: 59 | Admitting: Adult Health

## 2018-12-24 ENCOUNTER — Encounter: Payer: Self-pay | Admitting: Adult Health

## 2018-12-24 VITALS — BP 128/90 | HR 82 | Temp 98.5°F | Ht 64.0 in | Wt 256.7 lb

## 2018-12-24 DIAGNOSIS — R6889 Other general symptoms and signs: Secondary | ICD-10-CM | POA: Diagnosis not present

## 2018-12-24 DIAGNOSIS — J40 Bronchitis, not specified as acute or chronic: Secondary | ICD-10-CM | POA: Insufficient documentation

## 2018-12-24 LAB — POCT INFLUENZA A/B
Influenza A, POC: NEGATIVE
Influenza B, POC: NEGATIVE

## 2018-12-24 MED ORDER — ALBUTEROL SULFATE HFA 108 (90 BASE) MCG/ACT IN AERS: 2.0000 | INHALATION_SPRAY | Freq: Four times a day (QID) | RESPIRATORY_TRACT | 0 refills | Status: DC | PRN

## 2018-12-24 MED ORDER — AZITHROMYCIN 250 MG PO TABS: ORAL_TABLET | ORAL | 0 refills | Status: DC

## 2018-12-24 MED ORDER — BENZONATATE 200 MG PO CAPS: 200.0000 mg | ORAL_CAPSULE | Freq: Two times a day (BID) | ORAL | 0 refills | Status: DC | PRN

## 2018-12-24 NOTE — Assessment & Plan Note (Signed)
Flu Test Negative Please take Azithromycin as directed. Please take Tessalon and ProAir as needed. Continue to push fluids and rest often. If symptoms still persist after antibiotic completed, please call clinic.

## 2018-12-24 NOTE — Progress Notes (Signed)
Subjective:    Patient ID: Gina Farrell, female    DOB: [birth year], 48 y.o.   MRN: [id number]  HPI:  Ms.  Farrell presents with "scratchy throat", non-productive cough, yellow/thick nasal drainage, night sweats, body aches, and fatigue that all started >1 week ago and has steadily been worsening. She has been taking OTC Mucinex and increasing fluids She denies CP/palpitations/fever/N/V/D Her husband was Flu + 4 weeks ago She completed 10 day course of prophylactic Tamilfu 2.5 weeks ago She denies tobacco/vape use  Patient Care Team    Relationship Specialty Notifications Start End  Mellody Dance, DO PCP - General Family Medicine  09/01/16   Arvella Nigh, MD Consulting Physician Obstetrics and Gynecology  09/01/16   Druscilla Brownie, MD Consulting Physician Dermatology  12/20/17     Patient Active Problem List   Diagnosis Date Noted  . Flu-like symptoms 12/24/2018  . Bronchitis 12/24/2018  . Greater trochanteric bursitis of left hip 12/07/2018  . Hip flexor tendinitis 12/07/2018  . Hip flexor tendon tightness, left 12/07/2018  . Muscle spasm of back- thoracic and lumbar L > er R 12/07/2018  . Tinea corporis 12/07/2018  . h/o Asymptomatic microscopic hematuria- in GYN office 12/20/2017  . Encounter for wellness examination 12/20/2017  . BMI 40.0-44.9, adult ([facility]) 04/17/2017  . Allergic rhinitis 09/18/2016  . H/O tinea corporis 09/01/2016  . Hypomagnesemia 09/01/2016  . Hypertriglyceridemia 09/01/2016  . GERD (gastroesophageal reflux disease) 01/13/2015  . Environmental allergies   . Hyperlipidemia   . Vitamin D deficiency   . Benign essential hypertension 02/27/2012     Past Medical History:  Diagnosis Date  . Allergic rhinitis   . Chicken pox   . GERD (gastroesophageal reflux disease)   . Hypertension   . Miscarriage 1998/2008  . Vitamin D deficiency   . Vitamin D deficiency      Past Surgical History:  Procedure Laterality Date  . CESAREAN SECTION     x 2  .  d and c    . TUBAL LIGATION       Family History  Problem Relation Age of Onset  . Hypertension Mother   . Stroke Mother   . Atrial fibrillation Mother   . Diabetes Mother   . Rheum arthritis Maternal Grandmother   . Coronary artery disease Maternal Grandmother   . Hypertension Maternal Grandmother   . Ulcerative colitis Father   . Cancer Neg Hx      Social History   Substance and Sexual Activity  Drug Use No     Social History   Substance and Sexual Activity  Alcohol Use No  . Alcohol/week: 0.0 standard drinks   Comment: rare     Social History   Tobacco Use  Smoking Status Former Smoker  . Packs/day: 0.50  . Years: 14.00  . Pack years: 7.00  . Last attempt to quit: 11/28/2001  . Years since quitting: 17.0  Smokeless Tobacco Never Used     Outpatient Encounter Medications as of 12/24/2018  Medication Sig  . aspirin 81 MG tablet Take 81 mg by mouth daily.  . Cholecalciferol (VITAMIN D3) 5000 units TABS Take 1 tablet by mouth daily.  . ciclopirox (LOPROX) 0.77 % cream Apply topically 2 (two) times daily. (Patient taking differently: Apply 1 application topically as needed. )  . fluticasone (FLONASE) 50 MCG/ACT nasal spray Place into both nostrils as needed.   . montelukast (SINGULAIR) 10 MG tablet Take 1 tablet (10 mg total) by mouth at bedtime.  Marland Kitchen  Multiple Vitamins-Minerals (MULTIVITAMIN PO) Take by mouth daily.  Marland Kitchen olmesartan-hydrochlorothiazide (BENICAR HCT) 40-25 MG tablet Take 1 tablet by mouth daily.  . Omega-3 Fatty Acids (FISH OIL) 1000 MG CAPS Take 100 mg by mouth daily.  Marland Kitchen albuterol (PROVENTIL HFA;VENTOLIN HFA) 108 (90 Base) MCG/ACT inhaler Inhale 2 puffs into the lungs every 6 (six) hours as needed for wheezing or shortness of breath.  Marland Kitchen azithromycin (ZITHROMAX) 250 MG tablet 2 tabs day one.  1 tad days two-five  . benzonatate (TESSALON) 200 MG capsule Take 1 capsule (200 mg total) by mouth 2 (two) times daily as needed for cough.   No  facility-administered encounter medications on file as of 12/24/2018.     Allergies: Red yeast rice [cholestin]  Body mass index is 44.06 kg/m.  Blood pressure 128/90, pulse 82, temperature 98.5 F (36.9 C), temperature source Oral, height 5\' 4"  (1.626 m), weight 256 lb 11.2 oz (116.4 kg), SpO2 100 %.     Review of Systems  Constitutional: Positive for activity change and fatigue. Negative for appetite change, chills, diaphoresis, fever and unexpected weight change.  HENT: Positive for congestion, postnasal drip and sinus pressure. Negative for ear discharge and ear pain.   Eyes: Negative for visual disturbance.  Respiratory: Positive for cough and chest tightness. Negative for shortness of breath, wheezing and stridor.   Cardiovascular: Negative for chest pain, palpitations and leg swelling.  Gastrointestinal: Negative for abdominal distention, abdominal pain, constipation, diarrhea, nausea and vomiting.  Genitourinary: Negative for difficulty urinating and flank pain.  Neurological: Negative for dizziness and headaches.  Hematological: Does not bruise/bleed easily.  Psychiatric/Behavioral: Positive for sleep disturbance. Negative for agitation, behavioral problems, confusion, decreased concentration, dysphoric mood, hallucinations, self-injury and suicidal ideas. The patient is not nervous/anxious and is not hyperactive.        Objective:   Physical Exam Vitals signs and nursing note reviewed.  Constitutional:      Appearance: She is obese.  HENT:     Head: Normocephalic and atraumatic.     Right Ear: No decreased hearing noted. Tympanic membrane is not erythematous or bulging.     Left Ear: No decreased hearing noted. Tympanic membrane is not erythematous or bulging.     Nose:     Right Sinus: No maxillary sinus tenderness or frontal sinus tenderness.     Left Sinus: No maxillary sinus tenderness or frontal sinus tenderness.     Mouth/Throat:     Pharynx: Posterior  oropharyngeal erythema present. No oropharyngeal exudate.     Tonsils: No tonsillar exudate. Swelling: 1+ on the right. 1+ on the left.  Cardiovascular:     Rate and Rhythm: Normal rate.     Pulses: Normal pulses.     Heart sounds: Normal heart sounds. No murmur. No friction rub. No gallop.   Pulmonary:     Effort: Pulmonary effort is normal. No respiratory distress.     Breath sounds: Normal breath sounds. No stridor. No wheezing, rhonchi or rales.  Chest:     Chest wall: No tenderness.  Skin:    General: Skin is warm.     Capillary Refill: Capillary refill takes less than 2 seconds.  Neurological:     Mental Status: She is alert and oriented to person, place, and time.  Psychiatric:        Mood and Affect: Mood normal.        Behavior: Behavior normal.        Thought Content: Thought content normal.  Judgment: Judgment normal.       Assessment & Plan:   1. Flu-like symptoms   2. Bronchitis     Flu-like symptoms Flu Test Negative   Bronchitis Flu Test Negative Please take Azithromycin as directed. Please take Tessalon and ProAir as needed. Continue to push fluids and rest often. If symptoms still persist after antibiotic completed, please call clinic.    FOLLOW-UP:  Return if symptoms worsen or fail to improve.

## 2018-12-24 NOTE — Assessment & Plan Note (Signed)
Flu Test Negative

## 2018-12-24 NOTE — Patient Instructions (Signed)
Acute Bronchitis, Adult  Acute bronchitis is sudden (acute) swelling of the air tubes (bronchi) in the lungs. Acute bronchitis causes these tubes to fill with mucus, which can make it hard to breathe. It can also cause coughing or wheezing. In adults, acute bronchitis usually goes away within 2 weeks. A cough caused by bronchitis may last up to 3 weeks. Smoking, allergies, and asthma can make the condition worse. Repeated episodes of bronchitis may cause further lung problems, such as chronic obstructive pulmonary disease (COPD). What are the causes? This condition can be caused by germs and by substances that irritate the lungs, including:  Cold and flu viruses. This condition is most often caused by the same virus that causes a cold.  Bacteria.  Exposure to tobacco smoke, dust, fumes, and air pollution. What increases the risk? This condition is more likely to develop in people who:  Have close contact with someone with acute bronchitis.  Are exposed to lung irritants, such as tobacco smoke, dust, fumes, and vapors.  Have a weak immune system.  Have a respiratory condition such as asthma. What are the signs or symptoms? Symptoms of this condition include:  A cough.  Coughing up clear, yellow, or green mucus.  Wheezing.  Chest congestion.  Shortness of breath.  A fever.  Body aches.  Chills.  A sore throat. How is this diagnosed? This condition is usually diagnosed with a physical exam. During the exam, your health care provider may order tests, such as chest X-rays, to rule out other conditions. He or she may also:  Test a sample of your mucus for bacterial infection.  Check the level of oxygen in your blood. This is done to check for pneumonia.  Do a chest X-ray or lung function testing to rule out pneumonia and other conditions.  Perform blood tests. Your health care provider will also ask about your symptoms and medical history. How is this treated? Most  cases of acute bronchitis clear up over time without treatment. Your health care provider may recommend:  Drinking more fluids. Drinking more makes your mucus thinner, which may make it easier to breathe.  Taking a medicine for a fever or cough.  Taking an antibiotic medicine.  Using an inhaler to help improve shortness of breath and to control a cough.  Using a cool mist vaporizer or humidifier to make it easier to breathe. Follow these instructions at home: Medicines  Take over-the-counter and prescription medicines only as told by your health care provider.  If you were prescribed an antibiotic, take it as told by your health care provider. Do not stop taking the antibiotic even if you start to feel better. General instructions   Get plenty of rest.  Drink enough fluids to keep your urine pale yellow.  Avoid smoking and secondhand smoke. Exposure to cigarette smoke or irritating chemicals will make bronchitis worse. If you smoke and you need help quitting, ask your health care provider. Quitting smoking will help your lungs heal faster.  Use an inhaler, cool mist vaporizer, or humidifier as told by your health care provider.  Keep all follow-up visits as told by your health care provider. This is important. How is this prevented? To lower your risk of getting this condition again:  Wash your hands often with soap and water. If soap and water are not available, use hand sanitizer.  Avoid contact with people who have cold symptoms.  Try not to touch your hands to your mouth, nose, or eyes.    Make sure to get the flu shot every year. Contact a health care provider if:  Your symptoms do not improve in 2 weeks of treatment. Get help right away if:  You cough up blood.  You have chest pain.  You have severe shortness of breath.  You become dehydrated.  You faint or keep feeling like you are going to faint.  You keep vomiting.  You have a severe headache.  Your  fever or chills gets worse. This information is not intended to replace advice given to you by your health care provider. Make sure you discuss any questions you have with your health care provider. Document Released: 12/22/2004 Document Revised: 06/28/2017 Document Reviewed: 05/04/2016 Elsevier Interactive Patient Education  2019 [employer]  Flu Test Negative Please take Azithromycin as directed. Please take Tessalon and ProAir as needed. Continue to push fluids and rest often. If symptoms still persist after antibiotic completed, please call clinic. FEEL BETTER!

## 2018-12-27 ENCOUNTER — Other Ambulatory Visit: Payer: Self-pay | Admitting: Family Medicine

## 2018-12-27 ENCOUNTER — Other Ambulatory Visit: Payer: Self-pay | Admitting: Adult Health

## 2018-12-31 ENCOUNTER — Other Ambulatory Visit: Payer: Self-pay

## 2018-12-31 MED ORDER — MONTELUKAST SODIUM 10 MG PO TABS: 10.0000 mg | ORAL_TABLET | Freq: Every day | ORAL | 1 refills | Status: DC

## 2018-12-31 MED ORDER — OLMESARTAN MEDOXOMIL-HCTZ 40-25 MG PO TABS: 1.0000 | ORAL_TABLET | Freq: Every day | ORAL | 1 refills | Status: DC

## 2018-12-31 NOTE — Telephone Encounter (Signed)
Patient called [facility] pharmacy has sent 2 message regarding her refill to [place]-- forwarding addt'l request to medical assistant to refill these:  montelukast (SINGULAIR) 10 MG tablet [[id number]]   Order Details  Dose: 10 mg Route: Oral Frequency: Daily at bedtime  Dispense Quantity: 90 tablet Refills: 1 Fills remaining: --        Sig: Take 1 tablet (10 mg total) by mouth at bedtime.          olmesartan-hydrochlorothiazide (BENICAR HCT) 40-25 MG tablet [[id number]]   Order Details  Dose: 1 tablet Route: Oral Frequency: Daily  Dispense Quantity: 90 tablet Refills: 1 Fills remaining: --        Sig: Take 1 tablet by mouth daily.          --Forwarding request to use :  [place], [place] - [place] [phone] (Phone) [phone] (Fax)   --glh

## 2019-01-09 ENCOUNTER — Ambulatory Visit: Payer: 59 | Admitting: Family Medicine

## 2019-01-18 ENCOUNTER — Encounter: Payer: Self-pay | Admitting: Family Medicine

## 2019-01-18 ENCOUNTER — Ambulatory Visit: Payer: 59 | Admitting: Family Medicine

## 2019-01-18 ENCOUNTER — Other Ambulatory Visit: Payer: Self-pay | Admitting: Family Medicine

## 2019-01-18 VITALS — BP 128/87 | HR 89 | Temp 98.2°F | Ht 64.0 in | Wt 252.3 lb

## 2019-01-18 DIAGNOSIS — E785 Hyperlipidemia, unspecified: Secondary | ICD-10-CM

## 2019-01-18 DIAGNOSIS — I10 Essential (primary) hypertension: Secondary | ICD-10-CM

## 2019-01-18 DIAGNOSIS — Z6841 Body Mass Index (BMI) 40.0 and over, adult: Secondary | ICD-10-CM

## 2019-01-18 DIAGNOSIS — K219 Gastro-esophageal reflux disease without esophagitis: Secondary | ICD-10-CM | POA: Diagnosis not present

## 2019-01-18 DIAGNOSIS — E559 Vitamin D deficiency, unspecified: Secondary | ICD-10-CM

## 2019-01-18 DIAGNOSIS — E781 Pure hyperglyceridemia: Secondary | ICD-10-CM

## 2019-01-18 NOTE — Patient Instructions (Addendum)
To increase your fiber intake, try to eat whole fruits and vegetables, such as apples, kale, and spinach.  You may also incorporate psyllium husks as needed.  Please look into substituting your white rice with black beans or Farro or bulger wheat or even brown rice.  Also be sure to drink adequate amounts of water (aiming for around half your body weight in ounces of water).   Top Ten Foods for Health  1. Water Drink at least 8 to 12 cups of water daily. Consume half of your body weight in pounds, is the amount of water in ounces to drink daily.  Ie: a 200lb person = 100 oz water daily  2. Dark Green Vegetables Eat dark green vegetables at least three to four times a week. Good options include broccoli, peppers, brussel sprouts and leafy greens like kale and spinach.  3. Whole Grains Whole grains should be included in your diet at least two to three times daily. Look for whole wheat flour, rye, oatmeal, barley, amaranth, quinoa or a multigrain. A good source of fiber includes 3 to 4 grams of fiber per serving. A great source has 5 or more grams of fiber per serving.  4. Beans and Lentils Try to eat a bean-based meal at least once a week. Try to add legumes, including beans and lentils, to soups, stews, casseroles, salads and dips or eat them plain.  5. Fish Try to eat two to three serving of fish a week. A serving consists of 3 to 4 ounces of cooked fish. Good choices are salmon, trout, herring, bluefish, sardines and tuna.  6. Berries Include two to four servings of fruit in your diet each day. Try to eat berries such as raspberries, blueberries, blackberries and strawberries.  7. Winter Squash Eat butternut and acorn squash as well as other richly pigmented dark orange and green colored vegetables like sweet potato, cantaloupe and mango.  8. Soy 25 grams of soy protein a day is recommended as part of a low-fat diet to help lower cholesterol levels. Try tofu, soymilk, edamame  soybeans, tempeh and texturized vegetable protein (TVP).  9. Flaxseed, Nuts and Seeds Add 1 to 2 tablespoons of ground flaxseed or other seeds to food each day or include a moderate amount of nuts -- 1/4 cup -- in your daily diet.  10. Organic Yogurt Men and women between 36 and 23 years of age need 1000 milligrams of calcium a day and 1200 milligrams if 34 or older. Eat calcium-rich foods such as nonfat or low-fat dairy products three to four times a day. Include organic choices.    Behavior Modification Ideas for Weight Management  Weight management involves adopting a healthy lifestyle that includes a knowledge of nutrition and exercise, a positive attitude and the right kind of motivation. Internal motives such as better health, increased energy, self-esteem and personal control increase your chances of lifelong weight management success.  Remember to have realistic goals and think long-term success. Believe in yourself and you can do it. The following information will give you ideas to help you meet your goals.  Control Your Home Environment  Eat only while sitting down at the kitchen or dining room table. Do not eat while watching television, reading, cooking, talking on the phone, standing at the refrigerator or working on the computer. Keep tempting foods out of the house -- don't buy them. Keep tempting foods out of sight. Have low-calorie foods ready to eat. Unless you are preparing a meal, stay  out of the kitchen. Have healthy snacks at your disposal, such as small pieces of fruit, vegetables, canned fruit, pretzels, low-fat string cheese and nonfat cottage cheese.  Control Your Work Environment  Do not eat at [employer] or keep tempting snacks at your desk. If you get hungry between meals, plan healthy snacks and bring them with you to work. During your breaks, go for a walk instead of eating. If you work around food, plan in advance the one item you will eat at  mealtime. Make it inconvenient to nibble on food by chewing gum, sugarless candy or drinking water or another low-calorie beverage. Do not work through meals. Skipping meals slows down metabolism and may result in overeating at the next meal. If food is available for special occasions, either pick the healthiest item, nibble on low-fat snacks brought from home, don't have anything offered, choose one option and have a small amount, or have only a beverage.  Control Your Mealtime Environment  Serve your plate of food at the stove or kitchen counter. Do not put the serving dishes on the table. If you do put dishes on the table, remove them immediately when finished eating. Fill half of your plate with vegetables, a quarter with lean protein and a quarter with starch. Use smaller plates, bowls and glasses. A smaller portion will look large when it is in a little dish. Politely refuse second helpings. When fixing your plate, limit portions of food to one scoop/serving or less.   Daily Food Management  Replace eating with another activity that you will not associate with food. Wait 20 minutes before eating something you are craving. Drink a large glass of water or diet soda before eating. Always have a big glass or bottle of water to drink throughout the day. Avoid high-calorie add-ons such as cream with your coffee, butter, mayonnaise and salad dressings.  Shopping: Do not shop when hungry or tired. Shop from a list and avoid buying anything that is not on your list. If you must have tempting foods, buy individual-sized packages and try to find a lower-calorie alternative. Don't taste test in the store. Read food labels. Compare products to help you make the healthiest choices.  Preparation: Chew a piece of gum while cooking meals. Use a quarter teaspoon if you taste test your food. Try to only fix what you are going to eat, leaving yourself no chance for seconds. If you have prepared  more food than you need, portion it into individual containers and freeze or refrigerate immediately. Don't snack while cooking meals.  Eating: Eat slowly. Remember it takes about 20 minutes for your stomach to send a message to your brain that it is full. Don't let fake hunger make you think you need more. The ideal way to eat is to take a bite, put your utensil down, take a sip of water, cut your next bite, take a bit, put your utensil down and so on. Do not cut your food all at one time. Cut only as needed. Take small bites and chew your food well. Stop eating for a minute or two at least once during a meal or snack. Take breaks to reflect and have conversation.  Cleanup and Leftovers: Label leftovers for a specific meal or snack. Freeze or refrigerate individual portions of leftovers. Do not clean up if you are still hungry.  Eating Out and Social Eating  Do not arrive hungry. Eat something light before the meal. Try to fill up on  low-calorie foods, such as vegetables and fruit, and eat smaller portions of the high-calorie foods. Eat foods that you like, but choose small portions. If you want seconds, wait at least 20 minutes after you have eaten to see if you are actually hungry or if your eyes are bigger than your stomach. Limit alcoholic beverages. Try a soda water with a twist of lime. Do not skip other meals in the day to save room for the special event.  At Restaurants: Order  la carte rather than buffet style. Order some vegetables or a salad for an appetizer instead of eating bread. If you order a high-calorie dish, share it with someone. Try an after-dinner mint with your coffee. If you do have dessert, share it with two or more people. Don't overeat because you do not want to waste food. Ask for a doggie bag to take extra food home. Tell the server to put half of your entree in a to go bag before the meal is served to you. Ask for salad dressing, gravy or high-fat sauces  on the side. Dip the tip of your fork in the dressing before each bite. If bread is served, ask for only one piece. Try it plain without butter or oil. At [employer] where oil and vinegar is served with bread, use only a small amount of oil and a lot of vinegar for dipping.  At a [facility]: Offer to bring a dish, appetizer or dessert that is low in calories. Serve yourself small portions or tell the host that you only want a small amount. Stand or sit away from the snack table. Stay away from the kitchen or stay busy if you are near the food. Limit your alcohol intake.  At [employer] and Cafeterias: Cover most of your plate with lettuce and/or vegetables. Use a salad plate instead of a dinner plate. After eating, clear away your dishes before having coffee or tea.  Entertaining at Home: Explore low-fat, low-cholesterol cookbooks. Use single-serving foods like chicken breasts or hamburger patties. Prepare low-calorie appetizers and desserts.   Holidays: Keep tempting foods out of sight. Decorate the house without using food. Have low-calorie beverages and foods on hand for guests. Allow yourself one planned treat a day. Don't skip meals to save up for the holiday feast. Eat regular, planned meals.   Exercise Well  Make exercise a priority and a planned activity in the day. If possible, walk the entire or part of the distance to work. Get an exercise buddy. Go for a walk with a colleague during one of your breaks, go to the gym, run or take a walk with a friend, walk in the mall with a shopping companion. Park at the end of the parking lot and walk to the store or office entrance. Always take the stairs all of the way or at least part of the way to your floor. If you have a desk job, walk around the office frequently. Do leg lifts while sitting at your desk. Do something outside on the weekends like going for a hike or a bike ride.   Have a Healthy Attitude  Make  health your weight management priority. Be realistic. Have a goal to achieve a healthier you, not necessarily the lowest weight or ideal weight based on calculations or tables. Focus on a healthy eating style, not on dieting. Dieting usually lasts for a short amount of time and rarely produces long-term success. Think long term. You are developing new healthy behaviors  to follow next month, in a year and in a decade.    This information is for educational purposes only and is not intended to replace the advice of your doctor or health care provider. We encourage you to discuss with your doctor any questions or concerns you may have.        Guidelines for Losing Weight   We want weight loss that will last so you should lose 1-2 pounds a week.  THAT IS IT! Please pick THREE things a month to change. Once it is a habit check off the item. Then pick another three items off the list to become habits.  If you are already doing a habit on the list GREAT!  Cross that item off!  Dont drink your calories. Ie, alcohol, soda, fruit juice, and sweet tea.   Drink more water. Drink a glass when you feel hungry or before each meal.   Eat breakfast - Complex carb and protein (likeDannon light and fit yogurt, oatmeal, fruit, eggs, [place] bacon).  Measure your cereal.  Eat no more than one cup a day. (ie Kashi)  Eat an apple a day.  Add a vegetable a day.  Try a new vegetable a month.  Use Pam! Stop using oil or butter to cook.  Dont finish your plate or use smaller plates.  Share your dessert.  Eat sugar free Jello for dessert or frozen grapes.  Dont eat 2-3 hours before bed.  Switch to whole wheat bread, pasta, and brown rice.  Make healthier choices when you eat out. No fries!  Pick baked chicken, NOT fried.  Dont forget to SLOW DOWN when you eat. It is not going anywhere.   Take the stairs.  Park far away in the parking lot  Lift soup cans (or weights) for 10 minutes while  watching TV.  Walk at work for 10 minutes during break.  Walk outside 1 time a week with your friend, kids, dog, or significant other.  Start a walking group at church.  Walk the mall as much as you can tolerate.   Keep a food diary.  Weigh yourself daily.  Walk for 15 minutes 3 days per week.  Cook at home more often and eat out less. If life happens and you go back to old habits, it is okay.  Just start over. You can do it!  If you experience chest pain, get short of breath, or tired during the exercise, please stop immediately and inform your doctor.    Before you even begin to attack a weight-loss plan, it pays to remember this: You are not fat. You have fat. Losing weight isn't about blame or shame; it's simply another achievement to accomplish. Dieting is like any other skill--you have to buckle down and work at it. As long as you act in a smart, reasonable way, you'll ultimately get where you want to be. Here are some weight loss pearls for you.   1. It's Not a Diet. It's a Lifestyle Thinking of a diet as something you're on and suffering through only for the short term doesn't work. To shed weight and keep it off, you need to make permanent changes to the way you eat. It's OK to indulge occasionally, of course, but if you cut calories temporarily and then revert to your old way of eating, you'll gain back the weight quicker than you can say yo-yo. Use it to lose it. Research shows that one of the best predictors of long-term  weight loss is how many pounds you drop in the first month. For that reason, nutritionists often suggest being stricter for the first two weeks of your new eating strategy to build momentum. Cut out added sugar and alcohol and avoid unrefined carbs. After that, figure out how you can reincorporate them in a way that's healthy and maintainable.  2. There's a Right Way to Exercise Working out burns calories and fat and boosts your metabolism by building muscle.  But those trying to lose weight are notorious for overestimating the number of calories they burn and underestimating the amount they take in. Unfortunately, your system is biologically programmed to hold on to extra pounds and that means when you start exercising, your body senses the deficit and ramps up its hunger signals. If you're not diligent, you'll eat everything you burn and then some. Use it, to lose it. Cardio gets all the exercise glory, but strength and interval training are the real heroes. They help you build lean muscle, which in turn increases your metabolism and calorie-burning ability 3. Don't Overreact to Mild Hunger Some people have a hard time losing weight because of hunger anxiety. To them, being hungry is bad--something to be avoided at all costs--so they carry snacks with them and eat when they don't need to. Others eat because they're stressed out or bored. While you never want to get to the point of being ravenous (that's when bingeing is likely to happen), a hunger pang, a craving, or the fact that it's 3:00 p.m. should not send you racing for the vending machine or obsessing about the energy bar in your purse. Ideally, you should put off eating until your stomach is growling and it's difficult to concentrate.  Use it to lose it. When you feel the urge to eat, use the HALT method. Ask yourself, Am I really hungry? Or am I angry or anxious, lonely or bored, or tired? If you're still not certain, try the apple test. If you're truly hungry, an apple should seem delicious; if it doesn't, something else is going on. Or you can try drinking water and making yourself busy, if you are still hungry try a healthy snack.  4. Not All Calories Are Created Equal The mechanics of weight loss are pretty simple: Take in fewer calories than you use for energy. But the kind of food you eat makes all the difference. Processed food that's high in saturated fat and refined starch or sugar can cause  inflammation that disrupts the hormone signals that tell your brain you're full. The result: You eat a lot more.  Use it to lose it. Clean up your diet. Swap in whole, unprocessed foods, including vegetables, lean protein, and healthy fats that will fill you up and give you the biggest nutritional bang for your calorie buck. In a few weeks, as your brain starts receiving regular hunger and fullness signals once again, you'll notice that you feel less hungry overall and naturally start cutting back on the amount you eat.  5. Protein, Produce, and Plant-Based Fats Are Your Weight-Loss Trinity Here's why eating the three Ps regularly will help you drop pounds. Protein fills you up. You need it to build lean muscle, which keeps your metabolism humming so that you can torch more fat. People in a weight-loss program who ate double the recommended daily allowance for protein (about 110 grams for a 150-pound woman) lost 70 percent of their weight from fat, while people who ate the RDA lost only  about 40 percent, one study found. Produce is packed with filling fiber. "It's very difficult to consume too many calories if you're eating a lot of vegetables. Example: Three cups of broccoli is a lot of food, yet only 93 calories. (Fruit is another story. It can be easy to overeat and can contain a lot of calories from sugar, so be sure to monitor your intake.) Plant-based fats like olive oil and those in avocados and nuts are healthy and extra satiating.  Use it to lose it. Aim to incorporate each of the three Ps into every meal and snack. People who eat protein throughout the day are able to keep weight off, according to a study in the [place] of Clinical Nutrition. In addition to meat, poultry and seafood, good sources are beans, lentils, eggs, tofu, and yogurt. As for fat, keep portion sizes in check by measuring out salad dressing, oil, and nut butters (shoot for one to two tablespoons). Finally, eat veggies  or a little fruit at every meal. People who did that consumed 308 fewer calories but didn't feel any hungrier than when they didn't eat more produce.  7. How You Eat Is As Important As What You Eat In order for your brain to register that you're full, you need to focus on what you're eating. Sit down whenever you eat, preferably at a table. Turn off the TV or computer, put down your phone, and look at your food. Smell it. Chew slowly, and don't put another bite on your fork until you swallow. When women ate lunch this attentively, they consumed 30 percent less when snacking later than those who listened to an audiobook at lunchtime, according to a study in the [employer] of Nutrition. 8. Weighing Yourself Really Works The scale provides the best evidence about whether your efforts are paying off. Seeing the numbers tick up or down or stagnate is motivation to keep going--or to rethink your approach. A 2015 study at [facility] found that daily weigh-ins helped people lose more weight, keep it off, and maintain that loss, even after two years. Use it to lose it. Step on the scale at the same time every day for the best results. If your weight shoots up several pounds from one weigh-in to the next, don't freak out. Eating a lot of salt the night before or having your period is the likely culprit. The number should return to normal in a day or two. It's a steady climb that you need to do something about. 9. Too Much Stress and Too Little Sleep Are Your Enemies When you're tired and frazzled, your body cranks up the production of cortisol, the stress hormone that can cause carb cravings. Not getting enough sleep also boosts your levels of ghrelin, a hormone associated with hunger, while suppressing leptin, a hormone that signals fullness and satiety. People on a diet who slept only five and a half hours a night for two weeks lost 55 percent less fat and were hungrier than those who slept eight and a  half hours, according to a study in the [place]. Use it to lose it. Prioritize sleep, aiming for seven hours or more a night, which research shows helps lower stress. And make sure you're getting quality zzz's. If a snoring spouse or a fidgety cat wakes you up frequently throughout the night, you may end up getting the equivalent of just four hours of sleep, according to a study from [facility]. Keep  pets out of the bedroom, and use a white-noise app to drown out snoring. 10. You Will Hit a plateau--And You Can Bust Through It As you slim down, your body releases much less leptin, the fullness hormone.  If you're not strength training, start right now. Building muscle can raise your metabolism to help you overcome a plateau. To keep your body challenged and burning calories, incorporate new moves and more intense intervals into your workouts or add another sweat session to your weekly routine. Alternatively, cut an extra 100 calories or so a day from your diet. Now that you've lost weight, your body simply doesn't need as much fuel.    Since food equals calories, in order to lose weight you must either eat fewer calories, exercise more to burn off calories with activity, or both. Food that is not used to fuel the body is stored as fat. A major component of losing weight is to make smarter food choices. Here's how:  1)   Limit non-nutritious foods, such as: Sugar, honey, syrups and candy Pastries, donuts, pies, cakes and cookies Soft drinks, sweetened juices and alcoholic beverages  2)  Cut down on high-fat foods by: - Choosing poultry, fish or lean red meat - Choosing low-fat cooking methods, such as baking, broiling, steaming, grilling and boiling - Using low-fat or non-fat dairy products - Using vinaigrette, herbs, lemon or fat-free salad dressings - Avoiding fatty meats, such as bacon, sausage, franks, ribs and luncheon meats - Avoiding high-fat snacks  like nuts, chips and chocolate - Avoiding fried foods - Using less butter, margarine, oil and mayonnaise - Avoiding high-fat gravies, cream sauces and cream-based soups  3) Eat a variety of foods, including: - Fruit and vegetables that are raw, steamed or baked - Whole grains, breads, cereal, rice and pasta - Dairy products, such as low-fat or non-fat milk or yogurt, low-fat cottage cheese and low-fat cheese - Protein-rich foods like chicken, [place], fish, lean meat and legumes, or beans  4) Change your eating habits by: - Eat three balanced meals a day to help control your hunger - Watch portion sizes and eat small servings of a variety of foods - Choose low-calorie snacks - Eat only when you are hungry and stop when you are satisfied - Eat slowly and try not to perform other tasks while eating - Find other activities to distract you from food, such as walking, taking up a hobby or being involved in the community - Include regular exercise in your daily routine ( minimum of 20 min of moderate-intensity exercise at least 5 days/week)  - Find a support group, if necessary, for emotional support in your weight loss journey           Easy ways to cut 100 calories   1. Eat your eggs with hot sauce OR salsa instead of cheese.  Eggs are great for breakfast, but many people consider eggs and cheese to be BFFs. Instead of cheese--1 oz. of cheddar has 114 calories--top your eggs with hot sauce, which contains no calories and helps with satiety and metabolism. Salsa is also a great option!!  2. Top your toast, waffles or pancakes with fresh berries instead of jelly or syrup. Half a cup of berries--fresh, frozen or thawed--has about 40 calories, compared with 2 tbsp. of maple syrup or jelly, which both have about 100 calories. The berries will also give you a good punch of fiber, which helps keep you full and satisfied and wont spike blood sugar  quickly like the jelly or syrup. 3. Swap the  non-fat latte for black coffee with a splash of half-and-half. Contrary to its name, that non-fat latte has 130 calories and a startling 19g of carbohydrates per 16 oz. serving. Replacing that light drinkable dessert with a black coffee with a splash of half-and-half saves you more than 100 calories per 16 oz. serving. 4. Sprinkle salads with freeze-dried raspberries instead of dried cranberries. If you want a sweet addition to your nutritious salad, stay away from dried cranberries. They have a whopping 130 calories per  cup and 30g carbohydrates. Instead, sprinkle freeze-dried raspberries guilt-free and save more than 100 calories per  cup serving, adding 3g of belly-filling fiber. 5. Go for mustard in place of mayo on your sandwich. Mustard can add really nice flavor to any sandwich, and there are tons of varieties, from spicy to honey. A serving of mayo is 95 calories, versus 10 calories in a serving of mustard.  Or try an avocado mayo spread: You can find the recipe few click this link: https://www.californiaavocado.com/recipes/recipe-container/[place]-avocado-mayo 6. Choose a DIY salad dressing instead of the store-bought kind. Mix Dijon or whole grain mustard with low-fat Kefir or red wine vinegar and garlic. 7. Use hummus as a spread instead of a dip. Use hummus as a spread on a high-fiber cracker or tortilla with a sandwich and save on calories without sacrificing taste. 8. Pick just one salad accessory. Salad isnt automatically a calorie winner. Its easy to over-accessorize with toppings. Instead of topping your salad with nuts, avocado and cranberries (all three will clock in at 313 calories), just pick one. The next day, choose a different accessory, which will also keep your salad interesting. You dont wear all your jewelry every day, right? 9. Ditch the white pasta in favor of spaghetti squash. One cup of cooked spaghetti squash has about 40 calories, compared with  traditional spaghetti, which comes with more than 200. Spaghetti squash is also nutrient-dense. Its a good source of fiber and Vitamins A and C, and it can be eaten just like you would eat pasta--with a great tomato sauce and [place] meatballs or with pesto, tofu and spinach, for example. 10. Dress up your chili, soups and stews with non-fat [place] yogurt instead of sour cream. Just a dollop of sour cream can set you back 115 calories and a whopping 12g of fat--seven of which are of the artery-clogging variety. Added bonus: [place] yogurt is packed with muscle-building protein, calcium and B Vitamins. 11. Mash cauliflower instead of mashed potatoes. One cup of traditional mashed potatoes--in all their creamy goodness--has more than 200 calories, compared to mashed cauliflower, which you can typically eat for less than 100 calories per 1 cup serving. Cauliflower is a great source of the antioxidant indole-3-carbinol (I3C), which may help reduce the risk of some cancers, like breast cancer. 12. Ditch the ice cream sundae in favor of a [place] yogurt parfait. Instead of a cup of ice cream or fro-yo for dessert, try 1 cup of nonfat Greek yogurt topped with fresh berries and a sprinkle of cacao nibs. Both toppings are packed with antioxidants, which can help reduce cellular inflammation and oxidative damage. And the comparison is a no-brainer: One cup of ice cream has about 275 calories; one cup of frozen yogurt has about 230; and a cup of Greek yogurt has just 130, plus twice the protein, so youre less likely to return to the freezer for a second helping. 13. Put olive oil in  a spray container instead of using it directly from the bottle. Each tablespoon of olive oil is 120 calories and 15g of fat. Use a mister instead of pouring it straight into the pan or onto a salad. This allows for portion control and will save you more than 100 calories. 14. When baking, substitute canned pumpkin for butter or oil. Canned  pumpkin--not pumpkin pie mix--is loaded with Vitamin A, which is important for skin and eye health, as well as immunity. And the comparisons are pretty crazy:  cup of canned pumpkin has about 40 calories, compared to butter or oil, which has more than 800 calories. Yes, 800 calories. Applesauce and mashed banana can also serve as good substitutions for butter or oil, usually in a 1:1 ratio. 15. Top casseroles with high-fiber cereal instead of breadcrumbs. Breadcrumbs are typically made with white bread, while breakfast cereals contain 5-9g of fiber per serving. Not only will you save more than 150 calories per  cup serving, the swap will also keep you more full and youll get a metabolism boost from the added fiber. 16. Snack on pistachios instead of macadamia nuts. Believe it or not, you get the same amount of calories from 35 pistachios (100 calories) as you would from only five macadamia nuts. 17. Chow down on kale chips rather than potato chips. This is my favorite dont knock it till you try it swap. Kale chips are so easy to make at home, and you can spice them up with a little grated parmesan or chili powder. Plus, theyre a mere fraction of the calories of potato chips, but with the same crunch factor we crave so often. 18. Add seltzer and some fruit slices to your cocktail instead of soda or fruit juice. One cup of soda or fruit juice can pack on as much as 140 calories. Instead, use seltzer and fruit slices. The fruit provides valuable phytochemicals, such as flavonoids and anthocyanins, which help to combat cancer and stave off the aging process.

## 2019-01-18 NOTE — Progress Notes (Signed)
Wt loss OV note  Impression and Recommendations:    1. Hyperlipidemia, unspecified hyperlipidemia type   2. Hypertriglyceridemia   3. BMI 40.0-44.9, adult ([place])   4. Benign essential hypertension   5. Vitamin D deficiency   6. Gastroesophageal reflux disease without esophagitis   7. Hypomagnesemia     - Patient due for lab work.  Blood drawn today.  - Weight loss goal has been 8 lbs in four weeks.   - Patient has achieved her goal, and is down 19.9 lbs since she started tracking. - Patient agrees to lose 8 more lbs as her next goal.  - Advised patient to continue with her tracking as established, while eating less carbs, less fats, and more protein.  - Extensively advised patient to aim for protein as half of her daily nutritional intake.  - Weight Mgt - BMI of 42.31 Explained to patient what BMI refers to, and what it means medically.  Told patient to think about it as a "medical risk stratification measurement" and how increasing BMI is associated with increasing risk/ or worsening state of various diseases such as hypertension, hyperlipidemia, diabetes, premature OA, depression etc.  American Heart Association guidelines for healthy diet, basically Mediterranean diet, and exercise guidelines of 30 minutes 5 days per week or more discussed in detail.  -Reminded patient the need for yearly complete physical exam office visits in addition to office visits for management of the chronic diseases.  - Advised patient to continue working toward tracking her eating habits and exercising to improve overall mental, physical, and emotional health.    - Encouraged patient to engage in daily physical activity, especially a formal exercise routine.  Recommended that the patient eventually strive for at least 150 minutes of moderate cardiovascular activity per week according to guidelines established by the [facility].    - Healthy dietary habits encouraged, including low-carb, and high amounts  of lean protein in diet.   - Patient should also consume adequate amounts of water.  - To increase fiber intake, advised patient to eat whole fruits and vegetables, such as apples, kale and spinach.  Patient may also incorporate psyllium husks as needed.   2. Benign Essential Hypertension - Stable at this time. - Continue treatment plan as established. - Will continue to monitor.   Extensive education and routine counseling performed. Handouts provided.    No orders of the defined types were placed in this encounter.   Orders Placed This Encounter  Procedures  . CBC with Differential/Platelet  . Comprehensive metabolic panel  . Lipid panel  . Hemoglobin A1c  . TSH  . T4, free  . Magnesium  . VITAMIN D 25 Hydroxy (Vit-D Deficiency, Fractures)    The patient was counseled, risk factors were discussed, anticipatory guidance given.  -Gross side effects, risk and benefits, and alternatives of medications discussed with patient.  Patient is aware that all medications have potential side effects and we are unable to predict every side effect or drug-drug interaction that may occur.  Expresses verbal understanding and consents to current therapy plan and treatment regimen.   Return for 4wks- exactly- 8 lbs goal; bring in BP log.   Please see AVS handed out to patient at the end of our visit for further patient instructions/ counseling done pertaining to today's office visit.    Note:  This document was prepared using Dragon voice recognition software and may include unintentional dictation errors.    This document serves as a record  of services personally performed by Mellody Dance, DO. It was created on her behalf by Toni Amend, a trained medical scribe. The creation of this record is based on the scribe's personal observations and the provider's statements to them.   I have reviewed the above medical documentation for accuracy and completeness and I concur.  Mellody Dance, DO 01/24/2019 2:14 PM      ______________________________________________________________________    Subjective:  HPI: Gina Farrell y.o. female  presents for 3 month follow up for multiple medical problems.  Patient states she is down by 19.9 lbs from when she started tracking.  She has lost 7.7 lbs since last visit.  She's been weighing herself daily "because I'm obsessed now."  Notes she's down 0.2 lbs, 0.4 lbs each and every day.  Patient states she's feeling so motivated to continue her weight loss.  Notes she backed up on her nutritional app to 1.5 lbs of weight loss per week because she was feeling "starved" on a lower calorie allowance.  Nonetheless, she has been working very hard on eating more healthfully.  Shares her tracking in office today.  She has been keeping a record of all meals and snacks, including the creamer in her coffee.  States "I'm logging everything; snacks, drinks."  For the past week, she has her coffee in the morning, drinks a protein shake on the way to work, and doesn't eat next until lunch.  Patient has been trying hard to not eat after 8 PM at night.  States "the amazing thing is not feeling like I've gorged myself.  Just being able to eat something small, the portions, and being satisfied with it; pushing the plate away and being like 'I'm done.'"  Exercise Habits Says she's been trying to get her steps in, but she is not officially exercising.  Water Intake Averaging about 4-5 bottles of water per day.   Weight:  Wt Readings from Last 3 Encounters:  01/18/19 252 lb 4.8 oz (114.4 kg)  12/24/18 256 lb 11.2 oz (116.4 kg)  12/07/18 260 lb (117.9 kg)   BMI Readings from Last 3 Encounters:  01/18/19 43.31 kg/m  12/24/18 44.06 kg/m  12/07/18 44.63 kg/m   Lab Results  Component Value Date   HGBA1C 5.0 01/18/2019   HGBA1C 5.3 12/20/2017   HGBA1C 5.1 12/06/2016    Review of Systems: General:   No F/C, wt loss Pulm:    No DIB, SOB, pleuritic chest pain Card:  No CP, palpitations Abd:  No n/v/d or pain Ext:  No inc edema from baseline   Objective: Physical Exam: BP 128/87   Pulse 89   Temp 98.2 F (36.8 C)   Ht 5\' 4"  (1.626 m)   Wt 252 lb 4.8 oz (114.4 kg)   SpO2 99%   BMI 43.31 kg/m  Body mass index is 43.31 kg/m. General: Well nourished, in no apparent distress. Eyes: PERRLA, EOMs, conjunctiva clr no swelling or erythema ENT/Mouth: Hearing appears normal.  Mucus Membranes Moist  Neck: Supple, no masses Resp: Respiratory effort- normal, ECTA B/L w/o W/R/R  Cardio: RRR w/o MRGs. Abdomen: no gross distention. Lymphatics:  Brisk peripheral pulses, less 2 sec cap RF, no gross edema  M-sk: Full ROM, 5/5 strength, normal gait.  Skin: Warm, dry without rashes, lesions, ecchymosis.  Neuro: Alert, Oriented Psych: Normal affect, Insight and Judgment appropriate.    Current Medications:  Current Outpatient Medications on File Prior to Visit  Medication Sig Dispense Refill  . albuterol (PROVENTIL  HFA;VENTOLIN HFA) 108 (90 Base) MCG/ACT inhaler Inhale 2 puffs into the lungs every 6 (six) hours as needed for wheezing or shortness of breath.    Marland Kitchen aspirin 81 MG tablet Take 81 mg by mouth daily.    . Cholecalciferol (VITAMIN D3) 5000 units TABS Take 1 tablet by mouth daily.    . fluticasone (FLONASE) 50 MCG/ACT nasal spray Place into both nostrils daily.    . montelukast (SINGULAIR) 10 MG tablet Take 1 tablet (10 mg total) by mouth at bedtime. 90 tablet 1  . Multiple Vitamins-Minerals (MULTIVITAMIN PO) Take by mouth daily.    Marland Kitchen olmesartan-hydrochlorothiazide (BENICAR HCT) 40-25 MG tablet Take 1 tablet by mouth daily. 90 tablet 1  . Omega-3 Fatty Acids (FISH OIL) 1000 MG CAPS Take 100 mg by mouth daily.     No current facility-administered medications on file prior to visit.     Medical History:  Patient Active Problem List   Diagnosis Date Noted  . BMI 40.0-44.9, adult ([place]) 04/17/2017     Priority: High  . Benign essential hypertension 02/27/2012    Priority: High  . Hypertriglyceridemia 09/01/2016    Priority: Medium  . Hyperlipidemia     Priority: Medium  . GERD (gastroesophageal reflux disease) 01/13/2015    Priority: Low  . Environmental allergies     Priority: Low  . Vitamin D deficiency     Priority: Low  . Flu-like symptoms 12/24/2018  . Bronchitis 12/24/2018  . Greater trochanteric bursitis of left hip 12/07/2018  . Hip flexor tendinitis 12/07/2018  . Hip flexor tendon tightness, left 12/07/2018  . Muscle spasm of back- thoracic and lumbar L > er R 12/07/2018  . Tinea corporis 12/07/2018  . h/o Asymptomatic microscopic hematuria- in GYN office 12/20/2017  . Encounter for wellness examination 12/20/2017  . Allergic rhinitis 09/18/2016  . H/O tinea corporis 09/01/2016  . Hypomagnesemia 09/01/2016    Allergies:  Allergies  Allergen Reactions  . Red Yeast Rice [Cholestin]     Cramps/Heartburn     Family history-  Reviewed; changed as appropriate  Social history-  Reviewed; changed as appropriate

## 2019-01-19 LAB — MAGNESIUM: MAGNESIUM: 2.1 mg/dL (ref 1.6–2.3)

## 2019-01-19 LAB — COMPREHENSIVE METABOLIC PANEL
A/G RATIO: 1.6 (ref 1.2–2.2)
ALK PHOS: 72 IU/L (ref 39–117)
ALT: 19 IU/L (ref 0–32)
AST: 14 IU/L (ref 0–40)
Albumin: 4.1 g/dL (ref 3.8–4.8)
BILIRUBIN TOTAL: 0.3 mg/dL (ref 0.0–1.2)
BUN / CREAT RATIO: 20 (ref 9–23)
BUN: 13 mg/dL (ref 6–24)
CHLORIDE: 100 mmol/L (ref 96–106)
CO2: 25 mmol/L (ref 20–29)
Calcium: 9.5 mg/dL (ref 8.7–10.2)
Creatinine, Ser: 0.65 mg/dL (ref 0.57–1.00)
GFR calc non Af Amer: 106 mL/min/{1.73_m2} (ref >59)
GFR, EST AFRICAN AMERICAN: 122 mL/min/{1.73_m2} (ref >59)
Globulin, Total: 2.5 g/dL (ref 1.5–4.5)
Glucose: 93 mg/dL (ref 65–99)
POTASSIUM: 4.1 mmol/L (ref 3.5–5.2)
Sodium: 141 mmol/L (ref 134–144)
Total Protein: 6.6 g/dL (ref 6.0–8.5)

## 2019-01-19 LAB — LIPID PANEL
Chol/HDL Ratio: 4 ratio (ref 0.0–4.4)
Cholesterol, Total: 165 mg/dL (ref 100–199)
HDL: 41 mg/dL (ref >39)
LDL Calculated: 106 mg/dL — ABNORMAL HIGH (ref 0–99)
Triglycerides: 92 mg/dL (ref 0–149)
VLDL Cholesterol Cal: 18 mg/dL (ref 5–40)

## 2019-01-19 LAB — CBC WITH DIFFERENTIAL/PLATELET
Basophils Absolute: 0 x10E3/uL (ref 0.0–0.2)
Basos: 1 %
EOS (ABSOLUTE): 0.1 x10E3/uL (ref 0.0–0.4)
Eos: 1 %
Hematocrit: 39 % (ref 34.0–46.6)
Hemoglobin: 12.8 g/dL (ref 11.1–15.9)
Immature Grans (Abs): 0 x10E3/uL (ref 0.0–0.1)
Immature Granulocytes: 1 %
Lymphocytes Absolute: 1.4 x10E3/uL (ref 0.7–3.1)
Lymphs: 23 %
MCH: 27.4 pg (ref 26.6–33.0)
MCHC: 32.8 g/dL (ref 31.5–35.7)
MCV: 83 fL (ref 79–97)
Monocytes Absolute: 0.6 x10E3/uL (ref 0.1–0.9)
Monocytes: 9 %
Neutrophils Absolute: 4 x10E3/uL (ref 1.4–7.0)
Neutrophils: 65 %
Platelets: 240 x10E3/uL (ref 150–450)
RBC: 4.68 x10E6/uL (ref 3.77–5.28)
RDW: 13.4 % (ref 11.7–15.4)
WBC: 6.1 x10E3/uL (ref 3.4–10.8)

## 2019-01-19 LAB — T4, FREE: Free T4: 1.13 ng/dL (ref 0.82–1.77)

## 2019-01-19 LAB — TSH: TSH: 0.997 u[IU]/mL (ref 0.450–4.500)

## 2019-01-19 LAB — VITAMIN D 25 HYDROXY (VIT D DEFICIENCY, FRACTURES): Vit D, 25-Hydroxy: 83.6 ng/mL (ref 30.0–100.0)

## 2019-01-19 LAB — HEMOGLOBIN A1C
Est. average glucose Bld gHb Est-mCnc: 97 mg/dL
Hgb A1c MFr Bld: 5 % (ref 4.8–5.6)

## 2019-02-20 ENCOUNTER — Ambulatory Visit: Payer: 59 | Admitting: Family Medicine

## 2019-02-24 ENCOUNTER — Encounter: Payer: Self-pay | Admitting: Family Medicine

## 2019-04-09 IMAGING — MG DIGITAL DIAGNOSTIC UNILATERAL LEFT MAMMOGRAM WITH TOMO AND CAD
4 series · 4 of 12 positions shown · non-contrast
Comparison: Previous exam(s).

CLINICAL DATA: Follow-up of probably benign asymmetry in the left
breast.

EXAM:
DIGITAL DIAGNOSTIC LEFT MAMMOGRAM WITH CAD AND TOMO
ULTRASOUND LEFT BREAST

[L MLO synth-2D]
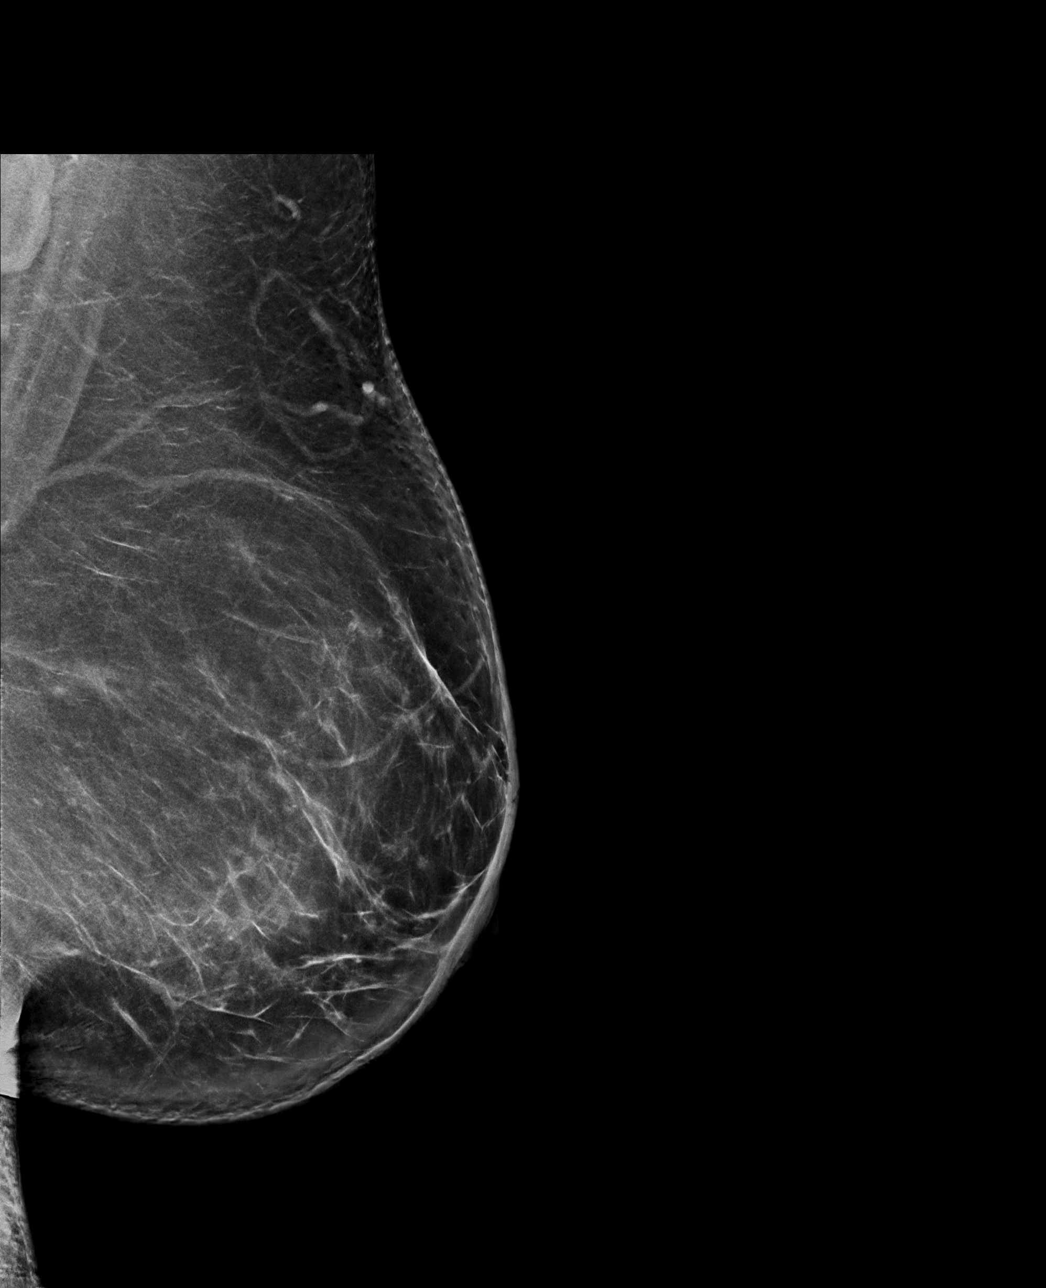

[L CC synth-2D]
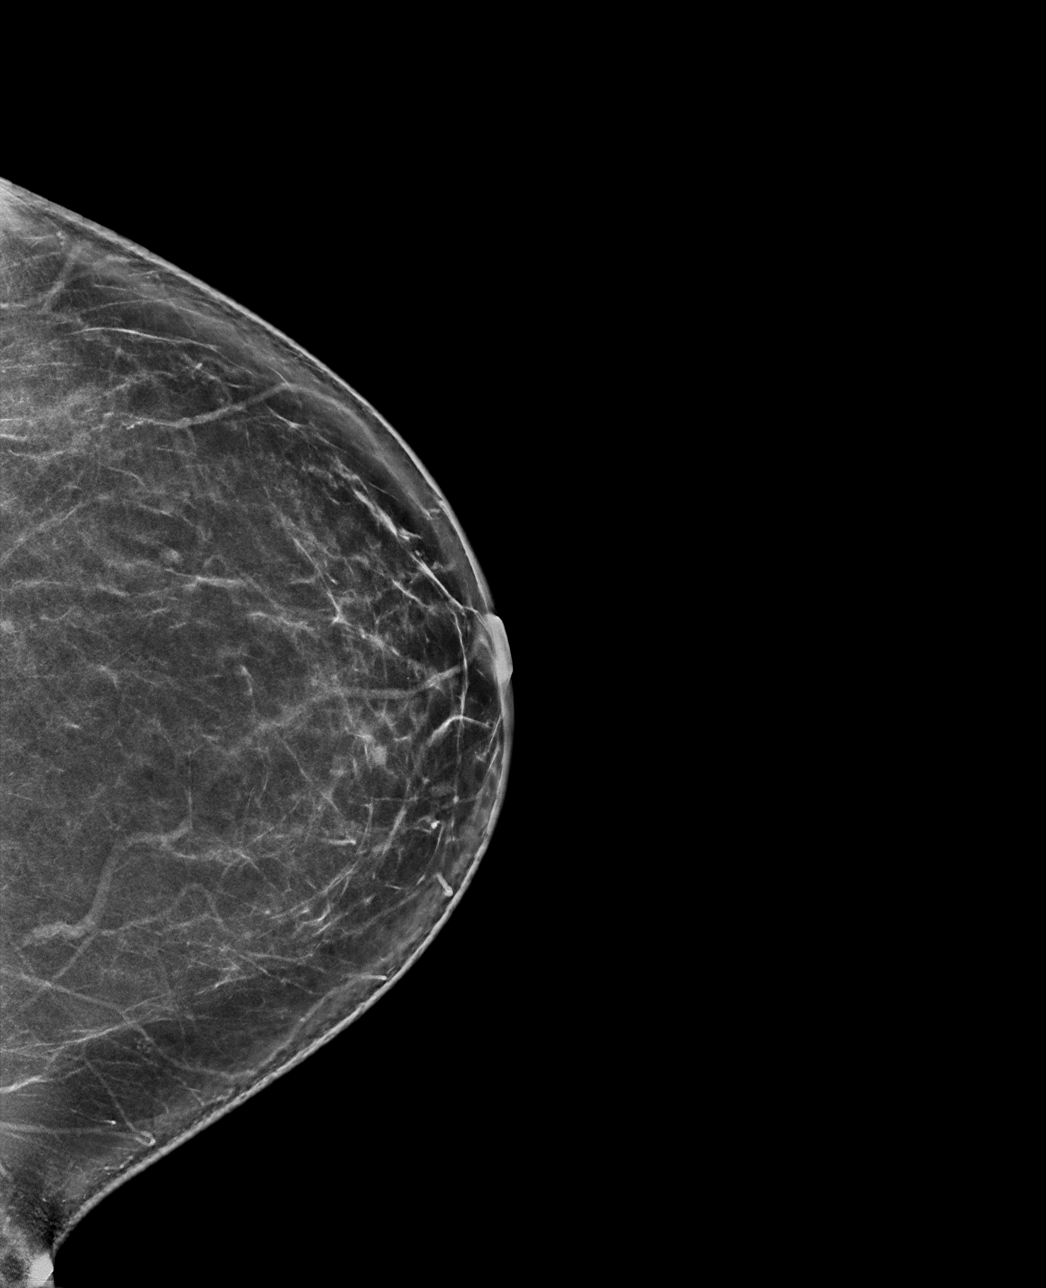

[L CC tomo · tomo slice 45/88.0]
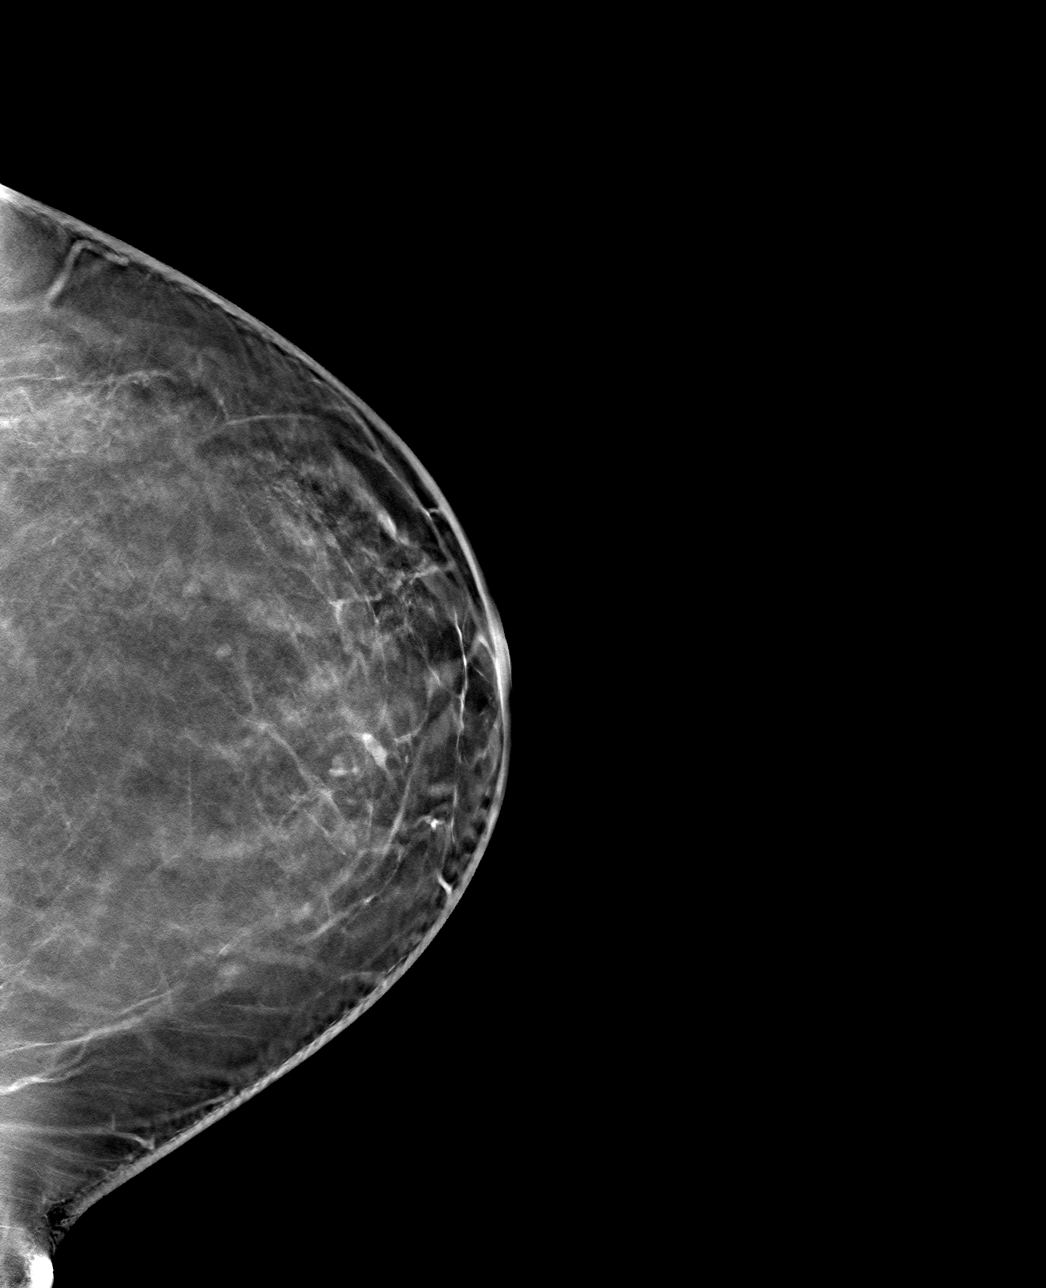

[L MLO tomo · tomo slice 51/100.0]
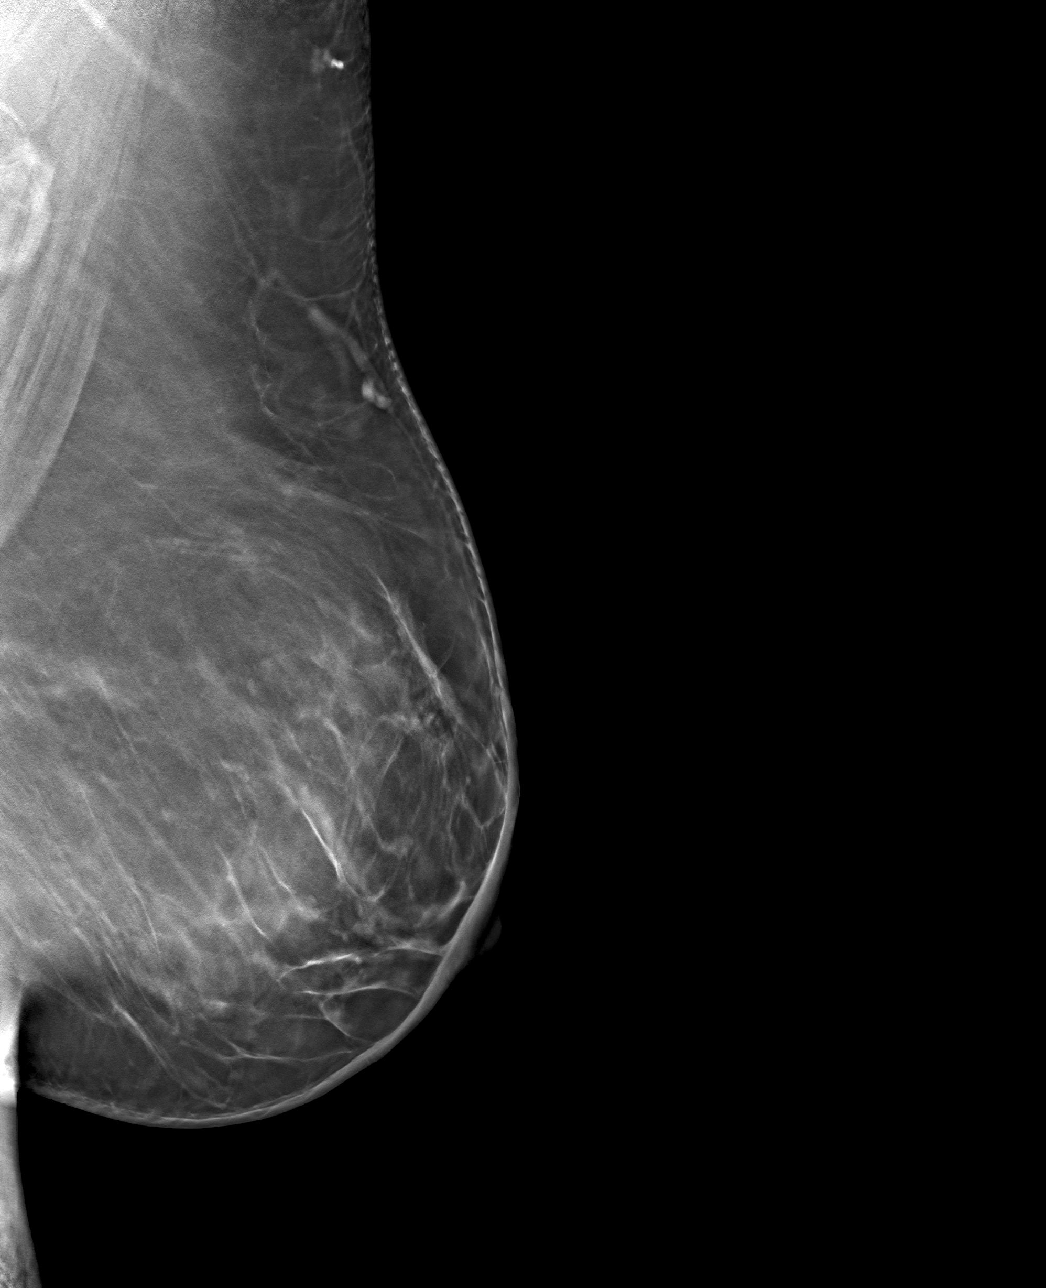

[4 of 12 positions shown; findings below may reference images not displayed]

ACR Breast Density Category b: There are scattered areas of
fibroglandular density.
FINDINGS: Scattered fibroglandular densities in the outer left breast
posteriorly are stable and consistent with benign breast tissue. Few
circumscribed nodules in the outer left breast. No suspicious mass
or distortion. No suspicious microcalcifications.

Mammographic images were processed with CAD.

Targeted ultrasound is performed, showing two small
cysts/complicated cysts in the [DATE] position of the left breast 5 cm
from the nipple, the largest measuring 0.5 cm. No suspicious mass in
the outer left breast.
IMPRESSION: Benign focal fibrocystic changes in the [DATE] position of the left
breast. No suspicious findings.

RECOMMENDATION:
Bilateral screening mammogram is recommended in March 2019.

I have discussed the findings and recommendations with the patient.
Results were also provided in writing at the conclusion of the
visit. If applicable, a reminder letter will be sent to the patient
regarding the next appointment.

BI-RADS CATEGORY  2: Benign.

## 2019-06-10 ENCOUNTER — Other Ambulatory Visit: Payer: Self-pay | Admitting: Family Medicine

## 2019-07-09 ENCOUNTER — Telehealth: Payer: Self-pay | Admitting: Family Medicine

## 2019-07-09 ENCOUNTER — Other Ambulatory Visit: Payer: Self-pay | Admitting: Family Medicine

## 2019-07-09 ENCOUNTER — Other Ambulatory Visit: Payer: Self-pay

## 2019-07-09 NOTE — Telephone Encounter (Signed)
Gina Farrell is aware she is due for a med f/u for additional refills and has scheduled an e visit with Dr. Jenetta Downer on Thursday but she states she will be out meds by tomorrow and is requesting a refill. If approved please send to [facility].

## 2019-07-09 NOTE — Telephone Encounter (Signed)
15 days sent in for patient and patient notified. MPulliam, CMA/RT(R)

## 2019-07-11 ENCOUNTER — Encounter: Payer: Self-pay | Admitting: Family Medicine

## 2019-07-11 ENCOUNTER — Other Ambulatory Visit: Payer: Self-pay

## 2019-07-11 ENCOUNTER — Ambulatory Visit (INDEPENDENT_AMBULATORY_CARE_PROVIDER_SITE_OTHER): Payer: 59 | Admitting: Family Medicine

## 2019-07-11 VITALS — BP 124/82 | Temp 97.6°F | Ht 64.0 in | Wt 258.0 lb

## 2019-07-11 DIAGNOSIS — E781 Pure hyperglyceridemia: Secondary | ICD-10-CM | POA: Diagnosis not present

## 2019-07-11 DIAGNOSIS — K219 Gastro-esophageal reflux disease without esophagitis: Secondary | ICD-10-CM

## 2019-07-11 DIAGNOSIS — Z6841 Body Mass Index (BMI) 40.0 and over, adult: Secondary | ICD-10-CM

## 2019-07-11 DIAGNOSIS — I10 Essential (primary) hypertension: Secondary | ICD-10-CM | POA: Diagnosis not present

## 2019-07-11 DIAGNOSIS — E785 Hyperlipidemia, unspecified: Secondary | ICD-10-CM | POA: Diagnosis not present

## 2019-07-11 DIAGNOSIS — Z9109 Other allergy status, other than to drugs and biological substances: Secondary | ICD-10-CM

## 2019-07-11 NOTE — Progress Notes (Signed)
Telehealth office visit note for Gina Farrell, D.O- at [facility] Care at [facility]   I connected with current patient today and verified that I am speaking with the correct person using two identifiers.   . Location of the patient: Home . Location of the provider: Office Only the patient (+/- their family members at pt's discretion) and myself were participating in the encounter    - This visit type was conducted due to national recommendations for restrictions regarding the COVID-19 Pandemic (e.g. social distancing) in an effort to limit this patient's exposure and mitigate transmission in our community.  This format is felt to be most appropriate for this patient at this time.   - The patient did not have access to video technology or had technical difficulties with video requiring transitioning to audio format only. - No physical exam could be performed with this format, beyond that communicated to [place] by the patient/ family members as noted.   - Additionally my office staff/ schedulers discussed with the patient that there may be a monetary charge related to this service, depending on their medical insurance.   The patient expressed understanding, and agreed to proceed.       History of Present Illness:   Says she's been doing good overall.  Says her daughter has had a couple of exposures through her job in healthcare, but is doing well and "is probably safer than I am in the grocery store."  Lifestyle During COVID To take care of herself during [place], she takes "long, hot bubble baths."  Says she locks the door and has iTunes going on.  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  Continues taking Singulair.  "As long as I take it, I'm fine."  1. HTN HPI:  -  Her blood pressure has reportedly been controlled at home.  Pt is checking it at home.  Feels her blood pressure has been fine, normal, no "elevations."  She usually checks it about 2-3 times per week, because she is "monitoring stuff at home."  The range it falls in is typically around 120-124/76-84. Her last remembered blood pressure over the weekend was 124/82.  - Patient reports good compliance with blood pressure medications  - Denies medication S-E   - Smoking Status noted   - She denies new onset of: chest pain, exercise intolerance, shortness of breath, dizziness, visual changes, headache, lower extremity swelling or claudication.   Last 3 blood pressure readings in our office are as follows: BP Readings from Last 3 Encounters:  07/11/19 124/82  01/18/19 128/87  12/24/18 128/90    Filed Weights   07/11/19 1436  Weight: 258 lb (117 kg)    2. 48 y.o. female here for cholesterol follow-up.   - Patient does not take cholesterol medications.  The cholesterol last visit was:  Lab Results  Component Value Date   CHOL 165 01/18/2019   HDL 41 01/18/2019   LDLCALC 106 (H) 01/18/2019   TRIG 92 01/18/2019   CHOLHDL 4.0 01/18/2019    Hepatic Function Latest Ref Rng & Units 01/18/2019 12/20/2017 12/06/2016  Total Protein 6.0 - 8.5 g/dL 6.6 6.6 6.8  Albumin 3.8 - 4.8 g/dL 4.1 4.0 4.1  AST 0 - 40 IU/L _0 ALT 0 - 32 IU/L _1 Alk Phosphatase 39 - 117 IU/L 72 68 69  Total Bilirubin 0.0 - 1.2 mg/dL 0.3 0.3 0.4  Bilirubin, Direct 0.0 - 0.3 mg/dL - - -     Impression and Recommendations:    1. Benign essential  hypertension   2. Hyperlipidemia, unspecified hyperlipidemia type   3. Hypertriglyceridemia   4. Gastroesophageal reflux disease without esophagitis   5. BMI 40.0-44.9, adult ([place])   6. Environmental allergies      - Labs last drawn January 18 2019, five months ago. - No changes were made to patient's medications at that time.  Benign Essential Hypertension - BP stable at this time. - Continue treatment plan as prescribed.  See med list below. - Patient tolerating meds well without complication.  Denies S-E  - Ongoing Ambulatory blood pressure monitoring encouraged.  - Lifestyle changes such as prudent diet and engaging in a regular exercise program discussed with patient.  Educational handouts provided  - Will continue to monitor.  Hyperlipidemia, Hypertriglyceridemia - Improved last check. - Need for re-check as recommended.  - Dietary changes such as low saturated & trans fat and low carb diets discussed with patient.  Encouraged regular exercise and weight loss when appropriate.   - Will continue to monitor.  GERD - Stable at this time. - Continue management as prescribed.  See med list. - Patient tolerating meds well without complication.  Denies S-E - Will continue to monitor.  Environmental Allergies - Stable at this time. - Continue management on Singulair.  See med list. - Patient tolerating meds well without complication.  Denies S-E - Will continue to monitor.  BMI Counseling - Body mass index is 44.29 kg/m Explained to patient what BMI refers to, and what it means medically.  Told patient to think about it as a "medical risk stratification measurement" and how increasing BMI is associated with increasing risk/ or worsening state of various diseases such as hypertension, hyperlipidemia, diabetes, premature OA, depression etc.  American Heart Association guidelines for healthy diet, basically Mediterranean diet, and exercise guidelines of 30 minutes 5 days per  week or more discussed in detail.  - Encouraged patient to resume exercise and weight loss goals as tolerated during current events.  Lifestyle & Preventative Health Maintenance - Advised patient to continue working toward exercising to improve overall mental, physical, and emotional health.    - Reviewed the "spokes of the wheel" of mood and health management.  Stressed the importance of ongoing prudent habits, including regular exercise, appropriate sleep hygiene, healthful dietary habits, and prayer/meditation to relax.  - Healthy dietary habits encouraged, including low-carb, and high amounts of lean protein in diet.   - Patient should also consume adequate amounts of water.  Recommendations - Encouraged working toward healthier lifestyle. - Continue to come in for chronic follow-up and routine lab work in the future as recommended.  - Health counseling performed.  All questions answered.  - As part of my medical decision making, I reviewed the following data within the [place] History  obtained from pt /family, CMA notes reviewed and incorporated if applicable, Labs reviewed, Radiograph/ tests reviewed if applicable and OV notes from prior OV's with me, as well as other specialists she/he has seen since seeing me last, were all reviewed and used in my medical decision making process today.   - Additionally, discussion had with patient regarding txmnt plan, and their biases/concerns about that plan were used in my medical decision making today.   - The patient agreed with the plan and demonstrated an understanding of the instructions.   No barriers to understanding were identified.   - Red flag symptoms and signs discussed in detail.  Patient expressed understanding regarding what to do in case of emergency\ urgent symptoms.  The patient was advised to call back or seek an in-person evaluation if the symptoms worsen or if the condition fails to improve as anticipated.    Return for [place] sooner if desires support with health goals etc.    Medications Discontinued During This Encounter  Medication Reason  . olmesartan-hydrochlorothiazide (BENICAR HCT) 40-25 MG tablet Change in therapy     I provided *18 minutes of non-face-to-face time during this encounter,with over 50% of the time in direct counseling on patients medical conditions/ medical concerns.  Additional time was spent with charting and coordination of care after the actual visit commenced.   Note:  This note was prepared with assistance of Dragon voice recognition software. Occasional wrong-word or sound-a-like substitutions may have occurred due to the inherent limitations of voice recognition software  This document serves as a record of services personally performed by DMellody Dance DO. It was created on her behalf by KToni Amend a trained medical scribe. The creation of this record is based on the scribe's personal observations and the provider's statements to them.   I have reviewed the above medical documentation for accuracy and completeness and I concur.  DMellody Dance DO 07/14/2019 7:18 PM       Patient Care Team    Relationship Specialty Notifications Start End  OMellody Dance DO PCP - General Family Medicine  09/01/16   MArvella Nigh MD Consulting Physician Obstetrics and Gynecology  09/01/16   LDruscilla Brownie MD Consulting Physician Dermatology  12/20/17      -Vitals obtained; medications/ allergies reconciled;  personal medical, social, Sx etc.histories were updated by CMA, reviewed by me and are reflected in chart   Patient Active Problem List   Diagnosis Date Noted  . BMI 40.0-44.9, adult ([place] 04/17/2017    Priority: High  . Benign essential hypertension 02/27/2012    Priority: High  . Hypertriglyceridemia 09/01/2016    Priority: Medium  . Hyperlipidemia     Priority: Medium  . GERD (gastroesophageal reflux disease) 01/13/2015    Priority:  Low  . Environmental allergies     Priority: Low  . Vitamin D deficiency     Priority: Low  . Flu-like symptoms 12/24/2018  . Bronchitis 12/24/2018  . Greater trochanteric bursitis of left hip 12/07/2018  . Hip flexor tendinitis 12/07/2018  . Hip flexor tendon tightness, left 12/07/2018  . Muscle spasm of back- thoracic and lumbar L > er R 12/07/2018  . Tinea corporis 12/07/2018  . h/o Asymptomatic microscopic hematuria- in GYN office 12/20/2017  . Encounter for wellness examination 12/20/2017  . Allergic rhinitis 09/18/2016  . H/O tinea corporis 09/01/2016  . Hypomagnesemia 09/01/2016     Current Meds  Medication Sig  . albuterol (PROVENTIL HFA;VENTOLIN HFA) 108 (90 Base) MCG/ACT inhaler Inhale  2 puffs into the lungs every 6 (six) hours as needed for wheezing or shortness of breath.  Marland Kitchen aspirin 81 MG tablet Take 81 mg by mouth daily.  . Cholecalciferol (VITAMIN D3) 5000 units TABS Take 1 tablet by mouth daily.  . fluticasone (FLONASE) 50 MCG/ACT nasal spray Place into both nostrils daily.  . hydrochlorothiazide (HYDRODIURIL) 25 MG tablet TAKE 1 TABLET BY MOUTH DAILY. NEEDS OFFICE VISIT FOR REFILLS  . montelukast (SINGULAIR) 10 MG tablet Take 1 tablet (10 mg total) by mouth at bedtime.  . Multiple Vitamins-Minerals (MULTIVITAMIN PO) Take by mouth daily.  Marland Kitchen olmesartan (BENICAR) 40 MG tablet TAKE 1 TABLET (40 MG TOTAL) BY MOUTH DAILY. NEEDS OFFICE VISIT FOR REFILLS  . Omega-3 Fatty Acids (FISH OIL) 1000 MG CAPS Take 100 mg by mouth daily.     Allergies:  Allergies  Allergen Reactions  . Red Yeast Rice [Cholestin]     Cramps/Heartburn     ROS:  See above HPI for pertinent positives and negatives   Objective:   Blood pressure 124/82, temperature 97.6 F (36.4 C), height _0  (1.626 m), weight 258 lb (117 kg).  (if some vitals are omitted, this means that patient was UNABLE to obtain them even though they were asked to get them prior to OV today.  They were asked to call  us at their earliest convenience with these once obtained. )  General: A & O * 3; sounds in no acute distress; in usual state of health.  Skin: Pt confirms warm and dry extremities and pink fingertips HEENT: Pt confirms lips non-cyanotic Chest: Patient confirms normal chest excursion and movement Respiratory: speaking in full sentences, no conversational dyspnea; patient confirms no use of accessory muscles Psych: insight appears good, mood- appears full

## 2019-09-09 ENCOUNTER — Other Ambulatory Visit: Payer: Self-pay | Admitting: Family Medicine

## 2019-09-27 ENCOUNTER — Encounter: Payer: Self-pay | Admitting: Family Medicine

## 2020-03-12 ENCOUNTER — Encounter: Payer: 59 | Admitting: Family Medicine

## 2020-03-21 ENCOUNTER — Other Ambulatory Visit: Payer: Self-pay | Admitting: Family Medicine

## 2020-04-16 ENCOUNTER — Ambulatory Visit (INDEPENDENT_AMBULATORY_CARE_PROVIDER_SITE_OTHER): Payer: 59 | Admitting: Physician Assistant

## 2020-04-16 ENCOUNTER — Encounter: Payer: Self-pay | Admitting: Physician Assistant

## 2020-04-16 ENCOUNTER — Other Ambulatory Visit: Payer: Self-pay

## 2020-04-16 VITALS — BP 137/82 | HR 92 | Temp 98.0°F | Ht 64.0 in | Wt 254.0 lb

## 2020-04-16 DIAGNOSIS — I10 Essential (primary) hypertension: Secondary | ICD-10-CM | POA: Diagnosis not present

## 2020-04-16 DIAGNOSIS — E559 Vitamin D deficiency, unspecified: Secondary | ICD-10-CM

## 2020-04-16 DIAGNOSIS — Z1329 Encounter for screening for other suspected endocrine disorder: Secondary | ICD-10-CM

## 2020-04-16 DIAGNOSIS — Z9109 Other allergy status, other than to drugs and biological substances: Secondary | ICD-10-CM

## 2020-04-16 DIAGNOSIS — Z131 Encounter for screening for diabetes mellitus: Secondary | ICD-10-CM

## 2020-04-16 DIAGNOSIS — Z6841 Body Mass Index (BMI) 40.0 and over, adult: Secondary | ICD-10-CM

## 2020-04-16 DIAGNOSIS — E785 Hyperlipidemia, unspecified: Secondary | ICD-10-CM

## 2020-04-16 DIAGNOSIS — J309 Allergic rhinitis, unspecified: Secondary | ICD-10-CM

## 2020-04-16 DIAGNOSIS — Z0000 Encounter for general adult medical examination without abnormal findings: Secondary | ICD-10-CM

## 2020-04-16 DIAGNOSIS — Z713 Dietary counseling and surveillance: Secondary | ICD-10-CM

## 2020-04-16 MED ORDER — MONTELUKAST SODIUM 10 MG PO TABS: 10.0000 mg | ORAL_TABLET | Freq: Every day | ORAL | 1 refills | Status: DC

## 2020-04-16 MED ORDER — OLMESARTAN MEDOXOMIL 40 MG PO TABS: ORAL_TABLET | ORAL | 1 refills | Status: DC

## 2020-04-16 MED ORDER — HYDROCHLOROTHIAZIDE 25 MG PO TABS: 25.0000 mg | ORAL_TABLET | Freq: Every day | ORAL | 1 refills | Status: DC

## 2020-04-16 NOTE — Progress Notes (Signed)
Female Physical   Impression and Recommendations:    1. Encounter for general adult medical examination w/o abnormal findings   2. Benign essential hypertension   3. Hyperlipidemia, unspecified hyperlipidemia type   4. BMI 40.0-44.9, adult ([place])   5. Environmental allergies   6. Hypomagnesemia   7. Vitamin D deficiency   8. Screening for diabetes mellitus   9. Screening for thyroid disorder   10. Allergic rhinitis, unspecified seasonality, unspecified trigger   11. Weight loss counseling, encounter for      1) Anticipatory Guidance: Discussed skin CA prevention and sunscreen when outside along with skin surveillance; eating a balanced and modest diet; physical activity at least 25 minutes per day or minimum of 150 min/ week moderate to intense activity. - Encourage to continue 30 min walks 3 times/wk  2) Immunizations / Screenings / Labs:   All immunizations are up-to-date per recommendations or will be updated today if pt allows.    - Patient understands with dental and vision screens they will schedule independently.  - Will obtain CBC, CMP, HgA1c, Lipid panel, TSH and vit D when fasting, if not already done past 12 mo/ recently  - Tdap UTD - Hep C screening declined. - HIV screening UTD  3) Weight:  BMI meaning discussed with patient.  Discussed goal to improve diet habits to improve overall feelings of well being and objective health data. Improve nutrient density of diet through increasing intake of fruits and vegetables and decreasing saturated fats, white flour products and refined sugars. - Pt is working on trying to lose weight since Feb 2021 and has made dietary changes by reducing carbohydrates and sweets. Her diet consists of protein shakes, salads and leaner meals. She is using the lose it app to help track her calories. She is highly motivated to continue making behavior modifications to lose more weight.  - Pt has lost 4 pounds since last OV.  4) Healthcare  Maintenance - Continue current medication regimen. Refills provided - Continue dietary and lifestyle changes - Stay well hydrated, at least 64 ounces - Follow up in 6 months for HTN and HLD  Meds ordered this encounter  Medications  . hydrochlorothiazide (HYDRODIURIL) 25 MG tablet    Sig: Take 1 tablet (25 mg total) by mouth daily.    Dispense:  90 tablet    Refill:  1  . montelukast (SINGULAIR) 10 MG tablet    Sig: Take 1 tablet (10 mg total) by mouth at bedtime.    Dispense:  90 tablet    Refill:  1  . olmesartan (BENICAR) 40 MG tablet    Sig: TAKE 1 TABLET BY MOUTH DAILY. TAKE WITH HYDROCHLOROTHIAZIDE 25 MG TABLET.    Dispense:  90 tablet    Refill:  1    Orders Placed This Encounter  Procedures  . CBC w/Diff  . Comp Met (CMET)  . Lipid Profile  . TSH  . Vitamin D (25 hydroxy)  . Magnesium  . HgB A1c     Return in about 6 months (around 10/17/2020) for HTN, HLD.    Gross side effects, risk and benefits, and alternatives of medications discussed with patient.  Patient is aware that all medications have potential side effects and we are unable to predict every side effect or drug-drug interaction that may occur.  Expresses verbal understanding and consents to current therapy plan and treatment regimen.  F-up preventative CPE in 1 year- reminded, this is in addition to any chronic care visits.  Please see orders placed and AVS handed out to patient at the end of our visit for further patient instructions/ counseling done pertaining to today's office visit.    Subjective:     CPE HPI: Gina Farrell is a 49 y.o. female who presents to [place] at [facility] today for a yearly health maintenance exam.   Health Maintenance Summary  - Reviewed and updated, unless pt declines services.  Last Cologuard or Colonoscopy:  N/A Family history of Colon CA: No  Tobacco History Reviewed:  Y, former smoker CT scan for screening lung CA: N/A Alcohol  and/or drug use:    No concerns; no use Exercise Habits:  30 mins 3x/wk Dental Home: Y Eye exams: Y Dermatology home: Y  Female Health:  PAP Smear - last known results: 07/2019 [facility], tracking down STD concerns:   none Lumps or breast concerns:  none Breast Cancer Family History: No   Additional concerns beyond health maintenance issues:  none   Immunization History  Administered Date(s) Administered  . Influenza-Unspecified 09/01/2014  . Td 11/28/2001  . Tdap 07/30/2011     Health Maintenance  Topic Date Due  . COVID-19 Vaccine (1) Never done  . PAP SMEAR-Modifier  03/28/2020  . INFLUENZA VACCINE  06/28/2020  . TETANUS/TDAP  07/29/2021  . HIV Screening  Addressed     Wt Readings from Last 3 Encounters:  04/16/20 254 lb (115.2 kg)  07/11/19 258 lb (117 kg)  01/18/19 252 lb 4.8 oz (114.4 kg)   BP Readings from Last 3 Encounters:  04/16/20 137/82  07/11/19 124/82  01/18/19 128/87   Pulse Readings from Last 3 Encounters:  04/16/20 92  01/18/19 89  12/24/18 82     Past Medical History:  Diagnosis Date  . Allergic rhinitis   . Chicken pox   . GERD (gastroesophageal reflux disease)   . Hypertension   . Miscarriage 1998/2008  . Vitamin D deficiency   . Vitamin D deficiency       Past Surgical History:  Procedure Laterality Date  . CESAREAN SECTION     x 2  . d and c    . TUBAL LIGATION        Family History  Problem Relation Age of Onset  . Hypertension Mother   . Stroke Mother   . Atrial fibrillation Mother   . Diabetes Mother   . Rheum arthritis Maternal Grandmother   . Coronary artery disease Maternal Grandmother   . Hypertension Maternal Grandmother   . Ulcerative colitis Father   . Cancer Neg Hx       Social History   Substance and Sexual Activity  Drug Use No  ,   Social History   Substance and Sexual Activity  Alcohol Use No  . Alcohol/week: 0.0 standard drinks   Comment: rare  ,   Social History    Tobacco Use  Smoking Status Former Smoker  . Packs/day: 0.50  . Years: 14.00  . Pack years: 7.00  . Quit date: 11/28/2001  . Years since quitting: 18.3  Smokeless Tobacco Never Used  ,   Social History   Substance and Sexual Activity  Sexual Activity Yes    Current Outpatient Medications on File Prior to Visit  Medication Sig Dispense Refill  . aspirin 81 MG tablet Take 81 mg by mouth daily.    . Cholecalciferol (VITAMIN D3) 5000 units TABS Take 1 tablet by mouth daily.    . fluticasone (FLONASE)  50 MCG/ACT nasal spray Place into both nostrils daily.    . Methylcobalamin (B12-ACTIVE PO) Take by mouth.    . Multiple Vitamins-Minerals (MULTIVITAMIN PO) Take by mouth daily.    . Omega-3 Fatty Acids (FISH OIL) 1000 MG CAPS Take 100 mg by mouth daily.    Marland Kitchen albuterol (PROVENTIL HFA;VENTOLIN HFA) 108 (90 Base) MCG/ACT inhaler Inhale 2 puffs into the lungs every 6 (six) hours as needed for wheezing or shortness of breath.     No current facility-administered medications on file prior to visit.    Allergies: Red yeast rice [cholestin]  Review of Systems: General: Denies fever, chills, unexplained weight loss.  Optho/Auditory: Denies visual changes, blurred vision/LOV Respiratory: Denies SOB, DOE more than baseline levels.   Cardiovascular: Denies chest pain, palpitations, new onset peripheral edema  Gastrointestinal: Denies nausea, vomiting, diarrhea.  Genitourinary: Denies dysuria, freq/ urgency, flank pain Endocrine:  Denies hot or cold intolerance, polyuria, polydipsia. Musculoskeletal: Denies unexplained myalgias, joint swelling, unexplained arthralgias, gait problems.  Skin: Denies rash, suspicious lesions Neurological: Denies dizziness, unexplained weakness, numbness  Psychiatric/Behavioral: Denies mood changes, suicidal or homicidal ideations, hallucinations    Objective:    Blood pressure 137/82, pulse 92, temperature 98 F (36.7 C), temperature source Oral, height  5' 4" (1.626 m), weight 254 lb (115.2 kg), SpO2 97 %. Body mass index is 43.6 kg/m. General Appearance:    Pleasant, Alert, cooperative, no distress, appears stated age  Head:    Normocephalic, without obvious abnormality, atraumatic  Eyes:    PERRL, conjunctiva/corneas clear, EOM's intact, fundi    benign, both eyes  Ears:    Right TM mildly erythematous with effusion, Normal Left TM,     Normal external ear canals, both ears. No tenderness on   exam.  Nose:   Nares normal, septum midline, mucosa normal, no drainage    or sinus tenderness  Throat:   Lips w/o lesion, mucosa moist, and tongue normal; teeth and   gums normal  Neck:   Supple, symmetrical, trachea midline, no adenopathy;    thyroid:  no enlargement/tenderness/nodules; no carotid   bruit or JVD  Back:     Symmetric, no curvature, ROM normal, no CVA tenderness  Lungs:     CTA bilaterally, respirations unlabored, no Wh/ R/ R  Chest Wall:    No tenderness or gross deformity; normal excursion   Heart:    Regular rate and rhythm, S1 and S2 normal, no murmur, rub   or gallop  Breast Exam:    Deferred to Ob-Gyn.  Abdomen:     Soft, non-tender, bowel sounds active all four quadrants, NO   G/R/R, no masses, no organomegaly  Genitalia:   Deferred.  Rectal:   Deferred.  Extremities:   Extremities normal, atraumatic, no cyanosis or gross edema  Pulses:   2+ and symmetric all extremities  Skin:   Warm, dry, Skin color, texture, turgor normal, no obvious rashes or lesions Psych: No HI/SI, judgement and insight good, Euthymic mood. Full Affect.  Neurologic:   CNII-XII intact, normal strength, sensation and reflexes    Throughout

## 2020-04-16 NOTE — Patient Instructions (Signed)
Behavior Modification Ideas for Weight Management  Weight management involves adopting a healthy lifestyle that includes a knowledge of nutrition and exercise, a positive attitude and the right kind of motivation. Internal motives such as better health, increased energy, self-esteem and personal control increase your chances of lifelong weight management success.  Remember to have realistic goals and think long-term success. Believe in yourself and you can do it. The following information will give you ideas to help you meet your goals.  Control Your Home Environment  Eat only while sitting down at the kitchen or dining room table. Do not eat while watching television, reading, cooking, talking on the phone, standing at the refrigerator or working on the computer. Keep tempting foods out of the house -- don't buy them. Keep tempting foods out of sight. Have low-calorie foods ready to eat. Unless you are preparing a meal, stay out of the kitchen. Have healthy snacks at your disposal, such as small pieces of fruit, vegetables, canned fruit, pretzels, low-fat string cheese and nonfat cottage cheese.  Control Your Work Environment  Do not eat at [employer] or keep tempting snacks at your desk. If you get hungry between meals, plan healthy snacks and bring them with you to work. During your breaks, go for a walk instead of eating. If you work around food, plan in advance the one item you will eat at mealtime. Make it inconvenient to nibble on food by chewing gum, sugarless candy or drinking water or another low-calorie beverage. Do not work through meals. Skipping meals slows down metabolism and may result in overeating at the next meal. If food is available for special occasions, either pick the healthiest item, nibble on low-fat snacks brought from home, don't have anything offered, choose one option and have a small amount, or have only a beverage.  Control Your Mealtime Environment  Serve  your plate of food at the stove or kitchen counter. Do not put the serving dishes on the table. If you do put dishes on the table, remove them immediately when finished eating. Fill half of your plate with vegetables, a quarter with lean protein and a quarter with starch. Use smaller plates, bowls and glasses. A smaller portion will look large when it is in a little dish. Politely refuse second helpings. When fixing your plate, limit portions of food to one scoop/serving or less.   Daily Food Management  Replace eating with another activity that you will not associate with food. Wait 20 minutes before eating something you are craving. Drink a large glass of water or diet soda before eating. Always have a big glass or bottle of water to drink throughout the day. Avoid high-calorie add-ons such as cream with your coffee, butter, mayonnaise and salad dressings.  Shopping: Do not shop when hungry or tired. Shop from a list and avoid buying anything that is not on your list. If you must have tempting foods, buy individual-sized packages and try to find a lower-calorie alternative. Don't taste test in the store. Read food labels. Compare products to help you make the healthiest choices.  Preparation: Chew a piece of gum while cooking meals. Use a quarter teaspoon if you taste test your food. Try to only fix what you are going to eat, leaving yourself no chance for seconds. If you have prepared more food than you need, portion it into individual containers and freeze or refrigerate immediately. Don't snack while cooking meals.  Eating: Eat slowly. Remember it takes about 20  minutes for your stomach to send a message to your brain that it is full. Don't let fake hunger make you think you need more. The ideal way to eat is to take a bite, put your utensil down, take a sip of water, cut your next bite, take a bit, put your utensil down and so on. Do not cut your food all at one time. Cut only  as needed. Take small bites and chew your food well. Stop eating for a minute or two at least once during a meal or snack. Take breaks to reflect and have conversation.  Cleanup and Leftovers: Label leftovers for a specific meal or snack. Freeze or refrigerate individual portions of leftovers. Do not clean up if you are still hungry.  Eating Out and Social Eating  Do not arrive hungry. Eat something light before the meal. Try to fill up on low-calorie foods, such as vegetables and fruit, and eat smaller portions of the high-calorie foods. Eat foods that you like, but choose small portions. If you want seconds, wait at least 20 minutes after you have eaten to see if you are actually hungry or if your eyes are bigger than your stomach. Limit alcoholic beverages. Try a soda water with a twist of lime. Do not skip other meals in the day to save room for the special event.  At Restaurants: Order  la carte rather than buffet style. Order some vegetables or a salad for an appetizer instead of eating bread. If you order a high-calorie dish, share it with someone. Try an after-dinner mint with your coffee. If you do have dessert, share it with two or more people. Don't overeat because you do not want to waste food. Ask for a doggie bag to take extra food home. Tell the server to put half of your entree in a to go bag before the meal is served to you. Ask for salad dressing, gravy or high-fat sauces on the side. Dip the tip of your fork in the dressing before each bite. If bread is served, ask for only one piece. Try it plain without butter or oil. At [employer] where oil and vinegar is served with bread, use only a small amount of oil and a lot of vinegar for dipping.  At a Friend[facility] House: Offer to bring a dish, appetizer or dessert that is low in calories. Serve yourself small portions or tell the host that you only want a small amount. Stand or sit away from the snack table. Stay  away from the kitchen or stay busy if you are near the food. Limit your alcohol intake.  At [employer] and Cafeterias: Cover most of your plate with lettuce and/or vegetables. Use a salad plate instead of a dinner plate. After eating, clear away your dishes before having coffee or tea.  Entertaining at Home: Explore low-fat, low-cholesterol cookbooks. Use single-serving foods like chicken breasts or hamburger patties. Prepare low-calorie appetizers and desserts.   Holidays: Keep tempting foods out of sight. Decorate the house without using food. Have low-calorie beverages and foods on hand for guests. Allow yourself one planned treat a day. Don't skip meals to save up for the holiday feast. Eat regular, planned meals.   Exercise Well  Make exercise a priority and a planned activity in the day. If possible, walk the entire or part of the distance to work. Get an exercise buddy. Go for a walk with a colleague during one of your breaks, go to the gym, run  or take a walk with a friend, walk in the mall with a shopping companion. Park at the end of the parking lot and walk to the store or office entrance. Always take the stairs all of the way or at least part of the way to your floor. If you have a desk job, walk around the office frequently. Do leg lifts while sitting at your desk. Do something outside on the weekends like going for a hike or a bike ride.   Have a Healthy Attitude  Make health your weight management priority. Be realistic. Have a goal to achieve a healthier you, not necessarily the lowest weight or ideal weight based on calculations or tables. Focus on a healthy eating style, not on dieting. Dieting usually lasts for a short amount of time and rarely produces long-term success. Think long term. You are developing new healthy behaviors to follow next month, in a year and in a decade.    This information is for educational purposes only and is not intended to replace  the advice of your doctor or health care provider. We encourage you to discuss with your doctor any questions or concerns you may have.        Guidelines for Losing Weight   We want weight loss that will last so you should lose 1-2 pounds a week.  THAT IS IT! Please pick THREE things a month to change. Once it is a habit check off the item. Then pick another three items off the list to become habits.  If you are already doing a habit on the list GREAT!  Cross that item off!  Don't drink your calories. Ie, alcohol, soda, fruit juice, and sweet tea.   Drink more water. Drink a glass when you feel hungry or before each meal.   Eat breakfast - Complex carb and protein (likeDannon light and fit yogurt, oatmeal, fruit, eggs, [place] bacon).  Measure your cereal.  Eat no more than one cup a day. (ie Kashi)  Eat an apple a day.  Add a vegetable a day.  Try a new vegetable a month.  Use Pam! Stop using oil or butter to cook.  Don't finish your plate or use smaller plates.  Share your dessert.  Eat sugar free Jello for dessert or frozen grapes.  Don't eat 2-3 hours before bed.  Switch to whole wheat bread, pasta, and brown rice.  Make healthier choices when you eat out. No fries!  Pick baked chicken, NOT fried.  Don't forget to SLOW DOWN when you eat. It is not going anywhere.   Take the stairs.  Park far away in the parking lot  Lift soup cans (or weights) for 10 minutes while watching TV.  Walk at work for 10 minutes during break.  Walk outside 1 time a week with your friend, kids, dog, or significant other.  Start a walking group at church.  Walk the mall as much as you can tolerate.   Keep a food diary.  Weigh yourself daily.  Walk for 15 minutes 3 days per week.  Cook at home more often and eat out less. If life happens and you go back to old habits, it is okay.  Just start over. You can do it!  If you experience chest pain, get short of breath, or tired  during the exercise, please stop immediately and inform your doctor.    Before you even begin to attack a weight-loss plan, it pays to remember this: You are  not fat. You have fat. Losing weight isn't about blame or shame; it's simply another achievement to accomplish. Dieting is like any other skill--you have to buckle down and work at it. As long as you act in a smart, reasonable way, you'll ultimately get where you want to be. Here are some weight loss pearls for you.   1. It's Not a Diet. It's a Lifestyle Thinking of a diet as something you're on and suffering through only for the short term doesn't work. To shed weight and keep it off, you need to make permanent changes to the way you eat. It's OK to indulge occasionally, of course, but if you cut calories temporarily and then revert to your old way of eating, you'll gain back the weight quicker than you can say yo-yo. Use it to lose it. Research shows that one of the best predictors of long-term weight loss is how many pounds you drop in the first month. For that reason, nutritionists often suggest being stricter for the first two weeks of your new eating strategy to build momentum. Cut out added sugar and alcohol and avoid unrefined carbs. After that, figure out how you can reincorporate them in a way that's healthy and maintainable.  2. There's a Right Way to Exercise Working out burns calories and fat and boosts your metabolism by building muscle. But those trying to lose weight are notorious for overestimating the number of calories they burn and underestimating the amount they take in. Unfortunately, your system is biologically programmed to hold on to extra pounds and that means when you start exercising, your body senses the deficit and ramps up its hunger signals. If you're not diligent, you'll eat everything you burn and then some. Use it, to lose it. Cardio gets all the exercise glory, but strength and interval training are the real  heroes. They help you build lean muscle, which in turn increases your metabolism and calorie-burning ability 3. Don't Overreact to Mild Hunger Some people have a hard time losing weight because of hunger anxiety. To them, being hungry is bad--something to be avoided at all costs--so they carry snacks with them and eat when they don't need to. Others eat because they're stressed out or bored. While you never want to get to the point of being ravenous (that's when bingeing is likely to happen), a hunger pang, a craving, or the fact that it's 3:00 p.m. should not send you racing for the vending machine or obsessing about the energy bar in your purse. Ideally, you should put off eating until your stomach is growling and it's difficult to concentrate.  Use it to lose it. When you feel the urge to eat, use the HALT method. Ask yourself, Am I really hungry? Or am I angry or anxious, lonely or bored, or tired? If you're still not certain, try the apple test. If you're truly hungry, an apple should seem delicious; if it doesn't, something else is going on. Or you can try drinking water and making yourself busy, if you are still hungry try a healthy snack.  4. Not All Calories Are Created Equal The mechanics of weight loss are pretty simple: Take in fewer calories than you use for energy. But the kind of food you eat makes all the difference. Processed food that's high in saturated fat and refined starch or sugar can cause inflammation that disrupts the hormone signals that tell your brain you're full. The result: You eat a lot more.  Use it to  lose it. Clean up your diet. Swap in whole, unprocessed foods, including vegetables, lean protein, and healthy fats that will fill you up and give you the biggest nutritional bang for your calorie buck. In a few weeks, as your brain starts receiving regular hunger and fullness signals once again, you'll notice that you feel less hungry overall and naturally start cutting back on  the amount you eat.  5. Protein, Produce, and Plant-Based Fats Are Your Weight-Loss Trinity Here's why eating the three Ps regularly will help you drop pounds. Protein fills you up. You need it to build lean muscle, which keeps your metabolism humming so that you can torch more fat. People in a weight-loss program who ate double the recommended daily allowance for protein (about 110 grams for a 150-pound woman) lost 70 percent of their weight from fat, while people who ate the RDA lost only about 40 percent, one study found. Produce is packed with filling fiber. "It's very difficult to consume too many calories if you're eating a lot of vegetables. Example: Three cups of broccoli is a lot of food, yet only 93 calories. (Fruit is another story. It can be easy to overeat and can contain a lot of calories from sugar, so be sure to monitor your intake.) Plant-based fats like olive oil and those in avocados and nuts are healthy and extra satiating.  Use it to lose it. Aim to incorporate each of the three Ps into every meal and snack. People who eat protein throughout the day are able to keep weight off, according to a study in the [place] of Clinical Nutrition. In addition to meat, poultry and seafood, good sources are beans, lentils, eggs, tofu, and yogurt. As for fat, keep portion sizes in check by measuring out salad dressing, oil, and nut butters (shoot for one to two tablespoons). Finally, eat veggies or a little fruit at every meal. People who did that consumed 308 fewer calories but didn't feel any hungrier than when they didn't eat more produce.  7. How You Eat Is As Important As What You Eat In order for your brain to register that you're full, you need to focus on what you're eating. Sit down whenever you eat, preferably at a table. Turn off the TV or computer, put down your phone, and look at your food. Smell it. Chew slowly, and don't put another bite on your fork until you swallow. When  women ate lunch this attentively, they consumed 30 percent less when snacking later than those who listened to an audiobook at lunchtime, according to a study in the [place] of Nutrition. 8. Weighing Yourself Really Works The scale provides the best evidence about whether your efforts are paying off. Seeing the numbers tick up or down or stagnate is motivation to keep going--or to rethink your approach. A 2015 study at [facility] found that daily weigh-ins helped people lose more weight, keep it off, and maintain that loss, even after two years. Use it to lose it. Step on the scale at the same time every day for the best results. If your weight shoots up several pounds from one weigh-in to the next, don't freak out. Eating a lot of salt the night before or having your period is the likely culprit. The number should return to normal in a day or two. It's a steady climb that you need to do something about. 9. Too Much Stress and Too Little Sleep Are Your Enemies When you're tired and  frazzled, your body cranks up the production of cortisol, the stress hormone that can cause carb cravings. Not getting enough sleep also boosts your levels of ghrelin, a hormone associated with hunger, while suppressing leptin, a hormone that signals fullness and satiety. People on a diet who slept only five and a half hours a night for two weeks lost 55 percent less fat and were hungrier than those who slept eight and a half hours, according to a study in the [employer]. Use it to lose it. Prioritize sleep, aiming for seven hours or more a night, which research shows helps lower stress. And make sure you're getting quality zzz's. If a snoring spouse or a fidgety cat wakes you up frequently throughout the night, you may end up getting the equivalent of just four hours of sleep, according to a study from [facility]. Keep pets out of the bedroom, and use a white-noise app to drown out  snoring. 10. You Will Hit a plateau--And You Can Bust Through It As you slim down, your body releases much less leptin, the fullness hormone.  If you're not strength training, start right now. Building muscle can raise your metabolism to help you overcome a plateau. To keep your body challenged and burning calories, incorporate new moves and more intense intervals into your workouts or add another sweat session to your weekly routine. Alternatively, cut an extra 100 calories or so a day from your diet. Now that you've lost weight, your body simply doesn't need as much fuel.    Since food equals calories, in order to lose weight you must either eat fewer calories, exercise more to burn off calories with activity, or both. Food that is not used to fuel the body is stored as fat. A major component of losing weight is to make smarter food choices. Here's how:  1)   Limit non-nutritious foods, such as: Sugar, honey, syrups and candy Pastries, donuts, pies, cakes and cookies Soft drinks, sweetened juices and alcoholic beverages  2)  Cut down on high-fat foods by: - Choosing poultry, fish or lean red meat - Choosing low-fat cooking methods, such as baking, broiling, steaming, grilling and boiling - Using low-fat or non-fat dairy products - Using vinaigrette, herbs, lemon or fat-free salad dressings - Avoiding fatty meats, such as bacon, sausage, franks, ribs and luncheon meats - Avoiding high-fat snacks like nuts, chips and chocolate - Avoiding fried foods - Using less butter, margarine, oil and mayonnaise - Avoiding high-fat gravies, cream sauces and cream-based soups  3) Eat a variety of foods, including: - Fruit and vegetables that are raw, steamed or baked - Whole grains, breads, cereal, rice and pasta - Dairy products, such as low-fat or non-fat milk or yogurt, low-fat cottage cheese and low-fat cheese - Protein-rich foods like chicken, [place], fish, lean meat and legumes, or beans  4)  Change your eating habits by: - Eat three balanced meals a day to help control your hunger - Watch portion sizes and eat small servings of a variety of foods - Choose low-calorie snacks - Eat only when you are hungry and stop when you are satisfied - Eat slowly and try not to perform other tasks while eating - Find other activities to distract you from food, such as walking, taking up a hobby or being involved in the community - Include regular exercise in your daily routine ( minimum of 20 min of moderate-intensity exercise at least 5 days/week)  - Find a support  group, if necessary, for emotional support in your weight loss journey           Easy ways to cut 100 calories   1. Eat your eggs with hot sauce OR salsa instead of cheese.  Eggs are great for breakfast, but many people consider eggs and cheese to be BFFs. Instead of cheese--1 oz. of cheddar has 114 calories--top your eggs with hot sauce, which contains no calories and helps with satiety and metabolism. Salsa is also a great option!!  2. Top your toast, waffles or pancakes with fresh berries instead of jelly or syrup. Half a cup of berries--fresh, frozen or thawed--has about 40 calories, compared with 2 tbsp. of maple syrup or jelly, which both have about 100 calories. The berries will also give you a good punch of fiber, which helps keep you full and satisfied and won't spike blood sugar quickly like the jelly or syrup. 3. Swap the non-fat latte for black coffee with a splash of half-and-half. Contrary to its name, that non-fat latte has 130 calories and a startling 19g of carbohydrates per 16 oz. serving. Replacing that 'light' drinkable dessert with a black coffee with a splash of half-and-half saves you more than 100 calories per 16 oz. serving. 4. Sprinkle salads with freeze-dried raspberries instead of dried cranberries. If you want a sweet addition to your nutritious salad, stay away from dried cranberries. They have a  whopping 130 calories per  cup and 30g carbohydrates. Instead, sprinkle freeze-dried raspberries guilt-free and save more than 100 calories per  cup serving, adding 3g of belly-filling fiber. 5. Go for mustard in place of mayo on your sandwich. Mustard can add really nice flavor to any sandwich, and there are tons of varieties, from spicy to honey. A serving of mayo is 95 calories, versus 10 calories in a serving of mustard.  Or try an avocado mayo spread: You can find the recipe few click this link: https://www.californiaavocado.com/recipes/recipe-container/[place]-avocado-mayo 6. Choose a DIY salad dressing instead of the store-bought kind. Mix Dijon or whole grain mustard with low-fat Kefir or red wine vinegar and garlic. 7. Use hummus as a spread instead of a dip. Use hummus as a spread on a high-fiber cracker or tortilla with a sandwich and save on calories without sacrificing taste. 8. Pick just one salad "accessory." Salad isn't automatically a calorie winner. It's easy to over-accessorize with toppings. Instead of topping your salad with nuts, avocado and cranberries (all three will clock in at 313 calories), just pick one. The next day, choose a different accessory, which will also keep your salad interesting. You don't wear all your jewelry every day, right? 9. Ditch the white pasta in favor of spaghetti squash. One cup of cooked spaghetti squash has about 40 calories, compared with traditional spaghetti, which comes with more than 200. Spaghetti squash is also nutrient-dense. It's a good source of fiber and Vitamins A and C, and it can be eaten just like you would eat pasta--with a great tomato sauce and [place] meatballs or with pesto, tofu and spinach, for example. 10. Dress up your chili, soups and stews with non-fat [place] yogurt instead of sour cream. Just a 'dollop' of sour cream can set you back 115 calories and a whopping 12g of fat--seven of which are of the artery-clogging  variety. Added bonus: [place] yogurt is packed with muscle-building protein, calcium and B Vitamins. 11. Mash cauliflower instead of mashed potatoes. One cup of traditional mashed potatoes--in all their creamy goodness--has more than  200 calories, compared to mashed cauliflower, which you can typically eat for less than 100 calories per 1 cup serving. Cauliflower is a great source of the antioxidant indole-3-carbinol (I3C), which may help reduce the risk of some cancers, like breast cancer. 12. Ditch the ice cream sundae in favor of a [place] yogurt parfait. Instead of a cup of ice cream or fro-yo for dessert, try 1 cup of nonfat Greek yogurt topped with fresh berries and a sprinkle of cacao nibs. Both toppings are packed with antioxidants, which can help reduce cellular inflammation and oxidative damage. And the comparison is a no-brainer: One cup of ice cream has about 275 calories; one cup of frozen yogurt has about 230; and a cup of Greek yogurt has just 130, plus twice the protein, so you're less likely to return to the freezer for a second helping. 13. Put olive oil in a spray container instead of using it directly from the bottle. Each tablespoon of olive oil is 120 calories and 15g of fat. Use a mister instead of pouring it straight into the pan or onto a salad. This allows for portion control and will save you more than 100 calories. 14. When baking, substitute canned pumpkin for butter or oil. Canned pumpkin--not pumpkin pie mix--is loaded with Vitamin A, which is important for skin and eye health, as well as immunity. And the comparisons are pretty crazy:  cup of canned pumpkin has about 40 calories, compared to butter or oil, which has more than 800 calories. Yes, 800 calories. Applesauce and mashed banana can also serve as good substitutions for butter or oil, usually in a 1:1 ratio. 15. Top casseroles with high-fiber cereal instead of breadcrumbs. Breadcrumbs are typically made with white bread,  while breakfast cereals contain 5-9g of fiber per serving. Not only will you save more than 150 calories per  cup serving, the swap will also keep you more full and you'll get a metabolism boost from the added fiber. 16. Snack on pistachios instead of macadamia nuts. Believe it or not, you get the same amount of calories from 35 pistachios (100 calories) as you would from only five macadamia nuts. 17. Chow down on kale chips rather than potato chips. This is my favorite 'don't knock it 'till you try it' swap. Kale chips are so easy to make at home, and you can spice them up with a little grated parmesan or chili powder. Plus, they're a mere fraction of the calories of potato chips, but with the same crunch factor we crave so often. 18. Add seltzer and some fruit slices to your cocktail instead of soda or fruit juice. One cup of soda or fruit juice can pack on as much as 140 calories. Instead, use seltzer and fruit slices. The fruit provides valuable phytochemicals, such as flavonoids and anthocyanins, which help to combat cancer and stave off the aging process.   Calorie Counting for Weight Loss Calories are units of energy. Your body needs a certain amount of calories from food to keep you going throughout the day. When you eat more calories than your body needs, your body stores the extra calories as fat. When you eat fewer calories than your body needs, your body burns fat to get the energy it needs. Calorie counting means keeping track of how many calories you eat and drink each day. Calorie counting can be helpful if you need to lose weight. If you make sure to eat fewer calories than your body needs, you should  lose weight. Ask your health care provider what a healthy weight is for you. For calorie counting to work, you will need to eat the right number of calories in a day in order to lose a healthy amount of weight per week. A dietitian can help you determine how many calories you need in a day  and will give you suggestions on how to reach your calorie goal.  A healthy amount of weight to lose per week is usually 1-2 lb (0.5-0.9 kg). This usually means that your daily calorie intake should be reduced by 500-750 calories.  Eating 1,200 - 1,500 calories per day can help most women lose weight.  Eating 1,500 - 1,800 calories per day can help most men lose weight. What is my plan? My goal is to have __________ calories per day. If I have this many calories per day, I should lose around __________ pounds per week. What do I need to know about calorie counting? In order to meet your daily calorie goal, you will need to:  Find out how many calories are in each food you would like to eat. Try to do this before you eat.  Decide how much of the food you plan to eat.  Write down what you ate and how many calories it had. Doing this is called keeping a food log. To successfully lose weight, it is important to balance calorie counting with a healthy lifestyle that includes regular activity. Aim for 150 minutes of moderate exercise (such as walking) or 75 minutes of vigorous exercise (such as running) each week. Where do I find calorie information?  The number of calories in a food can be found on a Nutrition Facts label. If a food does not have a Nutrition Facts label, try to look up the calories online or ask your dietitian for help. Remember that calories are listed per serving. If you choose to have more than one serving of a food, you will have to multiply the calories per serving by the amount of servings you plan to eat. For example, the label on a package of bread might say that a serving size is 1 slice and that there are 90 calories in a serving. If you eat 1 slice, you will have eaten 90 calories. If you eat 2 slices, you will have eaten 180 calories. How do I keep a food log? Immediately after each meal, record the following information in your food log:  What you ate. Don't forget  to include toppings, sauces, and other extras on the food.  How much you ate. This can be measured in cups, ounces, or number of items.  How many calories each food and drink had.  The total number of calories in the meal. Keep your food log near you, such as in a small notebook in your pocket, or use a mobile app or website. Some programs will calculate calories for you and show you how many calories you have left for the day to meet your goal. What are some calorie counting tips?   Use your calories on foods and drinks that will fill you up and not leave you hungry: ? Some examples of foods that fill you up are nuts and nut butters, vegetables, lean proteins, and high-fiber foods like whole grains. High-fiber foods are foods with more than 5 g fiber per serving. ? Drinks such as sodas, specialty coffee drinks, alcohol, and juices have a lot of calories, yet do not fill you up.  Eat nutritious foods and avoid empty calories. Empty calories are calories you get from foods or beverages that do not have many vitamins or protein, such as candy, sweets, and soda. It is better to have a nutritious high-calorie food (such as an avocado) than a food with few nutrients (such as a bag of chips).  Know how many calories are in the foods you eat most often. This will help you calculate calorie counts faster.  Pay attention to calories in drinks. Low-calorie drinks include water and unsweetened drinks.  Pay attention to nutrition labels for "low fat" or "fat free" foods. These foods sometimes have the same amount of calories or more calories than the full fat versions. They also often have added sugar, starch, or salt, to make up for flavor that was removed with the fat.  Find a way of tracking calories that works for you. Get creative. Try different apps or programs if writing down calories does not work for you. What are some portion control tips?  Know how many calories are in a serving. This will  help you know how many servings of a certain food you can have.  Use a measuring cup to measure serving sizes. You could also try weighing out portions on a kitchen scale. With time, you will be able to estimate serving sizes for some foods.  Take some time to put servings of different foods on your favorite plates, bowls, and cups so you know what a serving looks like.  Try not to eat straight from a bag or box. Doing this can lead to overeating. Put the amount you would like to eat in a cup or on a plate to make sure you are eating the right portion.  Use smaller plates, glasses, and bowls to prevent overeating.  Try not to multitask (for example, watch TV or use your computer) while eating. If it is time to eat, sit down at a table and enjoy your food. This will help you to know when you are full. It will also help you to be aware of what you are eating and how much you are eating. What are tips for following this plan? Reading food labels  Check the calorie count compared to the serving size. The serving size may be smaller than what you are used to eating.  Check the source of the calories. Make sure the food you are eating is high in vitamins and protein and low in saturated and trans fats. Shopping  Read nutrition labels while you shop. This will help you make healthy decisions before you decide to purchase your food.  Make a grocery list and stick to it. Cooking  Try to cook your favorite foods in a healthier way. For example, try baking instead of frying.  Use low-fat dairy products. Meal planning  Use more fruits and vegetables. Half of your plate should be fruits and vegetables.  Include lean proteins like poultry and fish. How do I count calories when eating out?  Ask for smaller portion sizes.  Consider sharing an entree and sides instead of getting your own entree.  If you get your own entree, eat only half. Ask for a box at the beginning of your meal and put the  rest of your entree in it so you are not tempted to eat it.  If calories are listed on the menu, choose the lower calorie options.  Choose dishes that include vegetables, fruits, whole grains, low-fat dairy products, and lean protein.  Choose items that are boiled, broiled, grilled, or steamed. Stay away from items that are buttered, battered, fried, or served with cream sauce. Items labeled "crispy" are usually fried, unless stated otherwise.  Choose water, low-fat milk, unsweetened iced tea, or other drinks without added sugar. If you want an alcoholic beverage, choose a lower calorie option such as a glass of wine or light beer.  Ask for dressings, sauces, and syrups on the side. These are usually high in calories, so you should limit the amount you eat.  If you want a salad, choose a garden salad and ask for grilled meats. Avoid extra toppings like bacon, cheese, or fried items. Ask for the dressing on the side, or ask for olive oil and vinegar or lemon to use as dressing.  Estimate how many servings of a food you are given. For example, a serving of cooked rice is  cup or about the size of half a baseball. Knowing serving sizes will help you be aware of how much food you are eating at restaurants. The list below tells you how big or small some common portion sizes are based on everyday objects: ? 1 oz--4 stacked dice. ? 3 oz--1 deck of cards. ? 1 tsp--1 die. ? 1 Tbsp-- a ping-pong ball. ? 2 Tbsp--1 ping-pong ball. ?  cup-- baseball. ? 1 cup--1 baseball. Summary  Calorie counting means keeping track of how many calories you eat and drink each day. If you eat fewer calories than your body needs, you should lose weight.  A healthy amount of weight to lose per week is usually 1-2 lb (0.5-0.9 kg). This usually means reducing your daily calorie intake by 500-750 calories.  The number of calories in a food can be found on a Nutrition Facts label. If a food does not have a Nutrition  Facts label, try to look up the calories online or ask your dietitian for help.  Use your calories on foods and drinks that will fill you up, and not on foods and drinks that will leave you hungry.  Use smaller plates, glasses, and bowls to prevent overeating. This information is not intended to replace advice given to you by your health care provider. Make sure you discuss any questions you have with your health care provider. Document Revised: 08/03/2018 Document Reviewed: 10/14/2016 Elsevier Patient Education  [place].

## 2020-04-17 LAB — COMPREHENSIVE METABOLIC PANEL
ALT: 12 IU/L (ref 0–32)
AST: 15 IU/L (ref 0–40)
Albumin/Globulin Ratio: 1.4 (ref 1.2–2.2)
Albumin: 3.8 g/dL (ref 3.8–4.8)
Alkaline Phosphatase: 76 IU/L (ref 48–121)
BUN/Creatinine Ratio: 22 (ref 9–23)
BUN: 14 mg/dL (ref 6–24)
Bilirubin Total: 0.2 mg/dL (ref 0.0–1.2)
CO2: 25 mmol/L (ref 20–29)
Calcium: 9.5 mg/dL (ref 8.7–10.2)
Chloride: 101 mmol/L (ref 96–106)
Creatinine, Ser: 0.65 mg/dL (ref 0.57–1.00)
GFR calc Af Amer: 121 mL/min/{1.73_m2} (ref >59)
GFR calc non Af Amer: 105 mL/min/{1.73_m2} (ref >59)
Globulin, Total: 2.8 g/dL (ref 1.5–4.5)
Glucose: 91 mg/dL (ref 65–99)
Potassium: 4.2 mmol/L (ref 3.5–5.2)
Sodium: 137 mmol/L (ref 134–144)
Total Protein: 6.6 g/dL (ref 6.0–8.5)

## 2020-04-17 LAB — CBC WITH DIFFERENTIAL/PLATELET
Basophils Absolute: 0 10*3/uL (ref 0.0–0.2)
Basos: 1 %
EOS (ABSOLUTE): 0.1 10*3/uL (ref 0.0–0.4)
Eos: 1 %
Hematocrit: 36.7 % (ref 34.0–46.6)
Hemoglobin: 12 g/dL (ref 11.1–15.9)
Immature Grans (Abs): 0.1 10*3/uL (ref 0.0–0.1)
Immature Granulocytes: 1 %
Lymphocytes Absolute: 1.6 10*3/uL (ref 0.7–3.1)
Lymphs: 22 %
MCH: 26.5 pg — ABNORMAL LOW (ref 26.6–33.0)
MCHC: 32.7 g/dL (ref 31.5–35.7)
MCV: 81 fL (ref 79–97)
Monocytes Absolute: 0.7 10*3/uL (ref 0.1–0.9)
Monocytes: 9 %
Neutrophils Absolute: 4.9 10*3/uL (ref 1.4–7.0)
Neutrophils: 66 %
Platelets: 242 10*3/uL (ref 150–450)
RBC: 4.52 x10E6/uL (ref 3.77–5.28)
RDW: 13.3 % (ref 11.7–15.4)
WBC: 7.4 10*3/uL (ref 3.4–10.8)

## 2020-04-17 LAB — LIPID PANEL
Chol/HDL Ratio: 3.3 ratio (ref 0.0–4.4)
Cholesterol, Total: 160 mg/dL (ref 100–199)
HDL: 48 mg/dL (ref >39)
LDL Chol Calc (NIH): 100 mg/dL — ABNORMAL HIGH (ref 0–99)
Triglycerides: 61 mg/dL (ref 0–149)
VLDL Cholesterol Cal: 12 mg/dL (ref 5–40)

## 2020-04-17 LAB — HEMOGLOBIN A1C
Est. average glucose Bld gHb Est-mCnc: 103 mg/dL
Hgb A1c MFr Bld: 5.2 % (ref 4.8–5.6)

## 2020-04-17 LAB — VITAMIN D 25 HYDROXY (VIT D DEFICIENCY, FRACTURES): Vit D, 25-Hydroxy: 87.6 ng/mL (ref 30.0–100.0)

## 2020-04-17 LAB — TSH: TSH: 0.758 u[IU]/mL (ref 0.450–4.500)

## 2020-04-17 LAB — MAGNESIUM: Magnesium: 2.1 mg/dL (ref 1.6–2.3)

## 2020-09-08 DIAGNOSIS — M2351 Chronic instability of knee, right knee: Secondary | ICD-10-CM | POA: Insufficient documentation

## 2020-09-14 ENCOUNTER — Other Ambulatory Visit: Payer: Self-pay | Admitting: Orthopedic Surgery

## 2020-09-14 DIAGNOSIS — M25561 Pain in right knee: Secondary | ICD-10-CM

## 2020-10-18 ENCOUNTER — Inpatient Hospital Stay: Admission: RE | Admit: 2020-10-18 | Payer: 59 | Source: Ambulatory Visit

## 2020-10-20 ENCOUNTER — Other Ambulatory Visit: Payer: Self-pay

## 2020-10-20 ENCOUNTER — Ambulatory Visit: Payer: 59 | Admitting: Physician Assistant

## 2020-10-20 ENCOUNTER — Encounter: Payer: Self-pay | Admitting: Physician Assistant

## 2020-10-20 VITALS — BP 135/90 | HR 89 | Ht 64.0 in | Wt 254.1 lb

## 2020-10-20 DIAGNOSIS — Z9109 Other allergy status, other than to drugs and biological substances: Secondary | ICD-10-CM

## 2020-10-20 DIAGNOSIS — E785 Hyperlipidemia, unspecified: Secondary | ICD-10-CM | POA: Diagnosis not present

## 2020-10-20 DIAGNOSIS — I10 Essential (primary) hypertension: Secondary | ICD-10-CM

## 2020-10-20 NOTE — Progress Notes (Signed)
Established Patient Office Visit  Subjective:  Patient ID: Gina Farrell, female    DOB: 1971-11-04  Age: 49 y.o. MRN: [id number]  CC:  Chief Complaint  Patient presents with  . Hypertension  . Hyperlipidemia    HPI GENIYAH EISCHEID presents for follow up on hypertension. Has no acute concerns.  HTN: Pt denies chest pain, palpitations, dizziness, headache or lower extremity swelling. Taking medication as directed without side effects. Checks BP at home and readings range 120-130s/70-80. Pt reports good hydration.  Environmental allergies: Reports has been on Singulair for several years which has helped keep allergy symptoms controlled. Used to have recurrent sinus infections but not as frequent since starting Singulair.    Past Medical History:  Diagnosis Date  . Allergic rhinitis   . Chicken pox   . GERD (gastroesophageal reflux disease)   . Hypertension   . Miscarriage 1998/2008  . Vitamin D deficiency   . Vitamin D deficiency     Past Surgical History:  Procedure Laterality Date  . CESAREAN SECTION     x 2  . d and c    . TUBAL LIGATION      Family History  Problem Relation Age of Onset  . Hypertension Mother   . Stroke Mother   . Atrial fibrillation Mother   . Diabetes Mother   . Rheum arthritis Maternal Grandmother   . Coronary artery disease Maternal Grandmother   . Hypertension Maternal Grandmother   . Ulcerative colitis Father   . Cancer Neg Hx     Social History   Socioeconomic History  . Marital status: Married    Spouse name: Not on file  . Number of children: Not on file  . Years of education: Not on file  . Highest education level: Not on file  Occupational History  . Not on file  Tobacco Use  . Smoking status: Former Smoker    Packs/day: 0.50    Years: 14.00    Pack years: 7.00    Quit date: 11/28/2001    Years since quitting: 18.9  . Smokeless tobacco: Never Used  Vaping Use  . Vaping Use: Never used  Substance and Sexual Activity   . Alcohol use: No    Alcohol/week: 0.0 standard drinks    Comment: rare  . Drug use: No  . Sexual activity: Yes  Other Topics Concern  . Not on file  Social History Narrative  . Not on file   Social Determinants of Health   Financial Resource Strain:   . Difficulty of Paying Living Expenses: Not on file  Food Insecurity:   . Worried About Charity fundraiser in the Last Year: Not on file  . Ran Out of Food in the Last Year: Not on file  Transportation Needs:   . Lack of Transportation (Medical): Not on file  . Lack of Transportation (Non-Medical): Not on file  Physical Activity:   . Days of Exercise per Week: Not on file  . Minutes of Exercise per Session: Not on file  Stress:   . Feeling of Stress : Not on file  Social Connections:   . Frequency of Communication with Friends and Family: Not on file  . Frequency of Social Gatherings with Friends and Family: Not on file  . Attends Religious Services: Not on file  . Active Member of Clubs or Organizations: Not on file  . Attends Archivist Meetings: Not on file  . Marital Status: Not on file  Intimate Partner Violence:   . Fear of Current or Ex-Partner: Not on file  . Emotionally Abused: Not on file  . Physically Abused: Not on file  . Sexually Abused: Not on file    Outpatient Medications Prior to Visit  Medication Sig Dispense Refill  . aspirin 81 MG tablet Take 81 mg by mouth daily.    . Cholecalciferol (VITAMIN D3) 5000 units TABS Take 1 tablet by mouth daily.    . fluticasone (FLONASE) 50 MCG/ACT nasal spray Place into both nostrils daily.    . hydrochlorothiazide (HYDRODIURIL) 25 MG tablet Take 1 tablet (25 mg total) by mouth daily. 90 tablet 1  . montelukast (SINGULAIR) 10 MG tablet Take 1 tablet (10 mg total) by mouth at bedtime. 90 tablet 1  . Multiple Vitamins-Minerals (MULTIVITAMIN PO) Take by mouth daily.    Marland Kitchen olmesartan (BENICAR) 40 MG tablet TAKE 1 TABLET BY MOUTH DAILY. TAKE WITH  HYDROCHLOROTHIAZIDE 25 MG TABLET. 90 tablet 1  . Omega-3 Fatty Acids (FISH OIL) 1000 MG CAPS Take 100 mg by mouth daily.    Marland Kitchen albuterol (PROVENTIL HFA;VENTOLIN HFA) 108 (90 Base) MCG/ACT inhaler Inhale 2 puffs into the lungs every 6 (six) hours as needed for wheezing or shortness of breath. (Patient not taking: Reported on 10/20/2020)    . Methylcobalamin (B12-ACTIVE PO) Take by mouth. (Patient not taking: Reported on 10/20/2020)     No facility-administered medications prior to visit.    Allergies  Allergen Reactions  . Red Yeast Rice [Cholestin]     Cramps/Heartburn    ROS Review of Systems A fourteen system review of systems was performed and found to be positive as per HPI.  Objective:    Physical Exam General:  Well Developed, well nourished, appropriate for stated age.  Neuro:  Alert and oriented,  extra-ocular muscles intact, no focal deficits   HEENT:  Normocephalic, atraumatic, neck supple Skin:  no gross rash, warm, pink. Cardiac:  RRR, S1 S2 Respiratory:  ECTA B/L, Not using accessory muscles, speaking in full sentences- unlabored. Vascular:  Ext warm, no cyanosis apprec.; cap RF less 2 sec. Psych:  No HI/SI, judgement and insight good, Euthymic mood. Full Affect.  BP 135/90   Pulse 89   Ht 5\' 4"  (1.626 m)   Wt 254 lb 1.6 oz (115.3 kg)   LMP  (LMP Unknown)   SpO2 100%   BMI 43.62 kg/m  Wt Readings from Last 3 Encounters:  10/20/20 254 lb 1.6 oz (115.3 kg)  04/16/20 254 lb (115.2 kg)  07/11/19 258 lb (117 kg)     Health Maintenance Due  Topic Date Due  . COVID-19 Vaccine (1) Never done  . PAP SMEAR-Modifier  03/28/2020  . INFLUENZA VACCINE  06/28/2020    There are no preventive care reminders to display for this patient.  Lab Results  Component Value Date   TSH 0.758 04/16/2020   Lab Results  Component Value Date   WBC 7.4 04/16/2020   HGB 12.0 04/16/2020   HCT 36.7 04/16/2020   MCV 81 04/16/2020   PLT 242 04/16/2020   Lab Results   Component Value Date   NA 137 04/16/2020   K 4.2 04/16/2020   CO2 25 04/16/2020   GLUCOSE 91 04/16/2020   BUN 14 04/16/2020   CREATININE 0.65 04/16/2020   BILITOT 0.2 04/16/2020   ALKPHOS 76 04/16/2020   AST 15 04/16/2020   ALT 12 04/16/2020   PROT 6.6 04/16/2020   ALBUMIN 3.8 04/16/2020   CALCIUM  9.5 04/16/2020   ANIONGAP 8 05/18/2018   GFR 96.58 01/19/2016   Lab Results  Component Value Date   CHOL 160 04/16/2020   Lab Results  Component Value Date   HDL 48 04/16/2020   Lab Results  Component Value Date   LDLCALC 100 (H) 04/16/2020   Lab Results  Component Value Date   TRIG 61 04/16/2020   Lab Results  Component Value Date   CHOLHDL 3.3 04/16/2020   Lab Results  Component Value Date   HGBA1C 5.2 04/16/2020      Assessment & Plan:   Problem List Items Addressed This Visit      Cardiovascular and Mediastinum   Benign essential hypertension - Primary (Chronic)     Other   Environmental allergies (Chronic)   Hyperlipidemia (Chronic)     Benign essential hypertension: -BP mildly elevated today. Previous BP readings have been stable and ambulatory readings are at goal. -Continue current medication regimen. -Stay well hydrated and monitor sodium. -Will continue to monitor.  Environmental allergies: -Controlled. -Continue current medication regimen. -Will continue to monitor.  Hyperlipidemia: -Last lipid panel wnl with exception of mildly elevated LDL at 100, which improved from prior. -Recommend to follow a diet low in saturated and trans fats. -Continue to stay as active as possible. -Will continue to monitor.  No orders of the defined types were placed in this encounter.   Follow-up: Return in about 6 months (around 04/19/2021) for CPE and FBW.   Note:  This note was prepared with assistance of Dragon voice recognition software. Occasional wrong-word or sound-a-like substitutions may have occurred due to the inherent limitations of voice  recognition software.   Lorrene Reid, PA-C

## 2020-10-20 NOTE — Patient Instructions (Signed)

## 2020-11-02 LAB — HM MAMMOGRAPHY

## 2020-11-03 LAB — HM PAP SMEAR: HM Pap smear: NORMAL

## 2020-11-09 ENCOUNTER — Other Ambulatory Visit: Payer: Self-pay | Admitting: Physician Assistant

## 2020-11-09 DIAGNOSIS — I10 Essential (primary) hypertension: Secondary | ICD-10-CM

## 2020-12-07 ENCOUNTER — Other Ambulatory Visit: Payer: Self-pay | Admitting: Physician Assistant

## 2020-12-07 DIAGNOSIS — Z9109 Other allergy status, other than to drugs and biological substances: Secondary | ICD-10-CM

## 2020-12-07 DIAGNOSIS — J309 Allergic rhinitis, unspecified: Secondary | ICD-10-CM

## 2021-02-11 ENCOUNTER — Other Ambulatory Visit: Payer: Self-pay | Admitting: Physician Assistant

## 2021-02-11 DIAGNOSIS — J309 Allergic rhinitis, unspecified: Secondary | ICD-10-CM

## 2021-02-11 DIAGNOSIS — Z9109 Other allergy status, other than to drugs and biological substances: Secondary | ICD-10-CM

## 2021-04-09 ENCOUNTER — Other Ambulatory Visit: Payer: Self-pay | Admitting: Physician Assistant

## 2021-04-09 DIAGNOSIS — J309 Allergic rhinitis, unspecified: Secondary | ICD-10-CM

## 2021-04-09 DIAGNOSIS — Z9109 Other allergy status, other than to drugs and biological substances: Secondary | ICD-10-CM

## 2021-04-12 ENCOUNTER — Telehealth: Payer: Self-pay | Admitting: Physician Assistant

## 2021-04-12 NOTE — Telephone Encounter (Signed)
Please contact patient to schedule per last AVS for med refills. AS, CMA

## 2021-04-12 NOTE — Telephone Encounter (Signed)
Left voicemail for patient

## 2021-04-21 ENCOUNTER — Ambulatory Visit: Payer: 59 | Admitting: Nurse Practitioner

## 2021-05-03 ENCOUNTER — Ambulatory Visit (INDEPENDENT_AMBULATORY_CARE_PROVIDER_SITE_OTHER): Payer: 59 | Admitting: Physician Assistant

## 2021-05-03 ENCOUNTER — Encounter: Payer: Self-pay | Admitting: Physician Assistant

## 2021-05-03 ENCOUNTER — Other Ambulatory Visit: Payer: Self-pay

## 2021-05-03 VITALS — BP 122/83 | HR 87 | Temp 97.7°F | Ht 64.0 in | Wt 263.0 lb

## 2021-05-03 DIAGNOSIS — J309 Allergic rhinitis, unspecified: Secondary | ICD-10-CM | POA: Diagnosis not present

## 2021-05-03 DIAGNOSIS — I10 Essential (primary) hypertension: Secondary | ICD-10-CM

## 2021-05-03 DIAGNOSIS — Z9109 Other allergy status, other than to drugs and biological substances: Secondary | ICD-10-CM

## 2021-05-03 DIAGNOSIS — Z87440 Personal history of urinary (tract) infections: Secondary | ICD-10-CM | POA: Diagnosis not present

## 2021-05-03 DIAGNOSIS — R319 Hematuria, unspecified: Secondary | ICD-10-CM | POA: Diagnosis not present

## 2021-05-03 LAB — POCT URINALYSIS DIPSTICK
Bilirubin, UA: NEGATIVE
Glucose, UA: NEGATIVE
Ketones, UA: NEGATIVE
Leukocytes, UA: NEGATIVE
Nitrite, UA: NEGATIVE
Protein, UA: NEGATIVE
Spec Grav, UA: 1.025 (ref 1.010–1.025)
Urobilinogen, UA: 0.2 E.U./dL
pH, UA: 7 (ref 5.0–8.0)

## 2021-05-03 MED ORDER — HYDROCHLOROTHIAZIDE 25 MG PO TABS: 1.0000 | ORAL_TABLET | Freq: Every day | ORAL | 1 refills | Status: DC

## 2021-05-03 MED ORDER — OLMESARTAN MEDOXOMIL 40 MG PO TABS: ORAL_TABLET | ORAL | 1 refills | Status: DC

## 2021-05-03 MED ORDER — MONTELUKAST SODIUM 10 MG PO TABS
1.0000 | ORAL_TABLET | Freq: Every day | ORAL | 1 refills | Status: DC
Start: 2021-05-03 — End: 2021-11-15

## 2021-05-03 NOTE — Patient Instructions (Signed)

## 2021-05-03 NOTE — Progress Notes (Signed)
Established Patient Office Visit  Subjective:  Patient ID: Gina Farrell, female    DOB: 1971-02-13  Age: 50 y.o. MRN: [id number]  CC:  Chief Complaint  Patient presents with  . Follow-up    Med Refills - UTI     HPI Karsten B Alvira presents for follow up on hypertension and allergies. Patient reports was treated for UTI at Urgent Care with two different antibiotics and recently completed ciprofloxacin, wants her urine rechecked to ensure resolution of UTI.  HTN: Pt denies chest pain, palpitations, dizziness, orthopnea or edema. Taking medication as directed without side effects. Checks BP at home and readings average <130/85. Pt follows a low salt diet. Reports good hydration.  Allergies: Reports medication compliance with Singulair. States has been on medication for a few years which has helped with preventing recurrent sinusitis and allergy symptoms are overall controlled. Uses Flonase when needed for runny nose or nasal symptoms.   Past Medical History:  Diagnosis Date  . Allergic rhinitis   . Chicken pox   . GERD (gastroesophageal reflux disease)   . Hypertension   . Miscarriage 1998/2008  . Vitamin D deficiency   . Vitamin D deficiency     Past Surgical History:  Procedure Laterality Date  . CESAREAN SECTION     x 2  . d and c    . TUBAL LIGATION      Family History  Problem Relation Age of Onset  . Hypertension Mother   . Stroke Mother   . Atrial fibrillation Mother   . Diabetes Mother   . Rheum arthritis Maternal Grandmother   . Coronary artery disease Maternal Grandmother   . Hypertension Maternal Grandmother   . Ulcerative colitis Father   . Cancer Neg Hx     Social History   Socioeconomic History  . Marital status: Married    Spouse name: Not on file  . Number of children: Not on file  . Years of education: Not on file  . Highest education level: Not on file  Occupational History  . Not on file  Tobacco Use  . Smoking status: Former Smoker     Packs/day: 0.50    Years: 14.00    Pack years: 7.00    Quit date: 11/28/2001    Years since quitting: 19.4  . Smokeless tobacco: Never Used  Vaping Use  . Vaping Use: Never used  Substance and Sexual Activity  . Alcohol use: No    Alcohol/week: 0.0 standard drinks    Comment: rare  . Drug use: No  . Sexual activity: Yes  Other Topics Concern  . Not on file  Social History Narrative  . Not on file   Social Determinants of Health   Financial Resource Strain: Not on file  Food Insecurity: Not on file  Transportation Needs: Not on file  Physical Activity: Not on file  Stress: Not on file  Social Connections: Not on file  Intimate Partner Violence: Not on file    Outpatient Medications Prior to Visit  Medication Sig Dispense Refill  . aspirin 81 MG tablet Take 81 mg by mouth daily.    . Cholecalciferol (VITAMIN D3) 5000 units TABS Take 1 tablet by mouth daily.    . fluticasone (FLONASE) 50 MCG/ACT nasal spray Place into both nostrils daily.    . Multiple Vitamins-Minerals (MULTIVITAMIN PO) Take by mouth daily.    . Omega-3 Fatty Acids (FISH OIL) 1000 MG CAPS Take 100 mg by mouth daily.    Marland Kitchen  hydrochlorothiazide (HYDRODIURIL) 25 MG tablet TAKE 1 TABLET BY MOUTH DAILY. 90 tablet 1  . montelukast (SINGULAIR) 10 MG tablet Take 1 tablet (10 mg total) by mouth at bedtime. **NEEDS APT FOR REFILLS** 60 tablet 0  . olmesartan (BENICAR) 40 MG tablet TAKE 1 TABLET BY MOUTH DAILY. TAKE WITH HYDROCHLOROTHIAZIDE 25 MG TABLET. 90 tablet 1   No facility-administered medications prior to visit.    Allergies  Allergen Reactions  . Red Yeast Rice [Cholestin]     Cramps/Heartburn    ROS Review of Systems A fourteen system review of systems was performed and found to be positive as per HPI.  Objective:    Physical Exam General:  Well Developed, well nourished, appropriate for stated age, in no acute distress  Neuro:  Alert and oriented,  extra-ocular muscles intact  HEENT:   Normocephalic, atraumatic, neck supple Skin:  no gross rash, warm, pink. Cardiac:  RRR, S1 S2 Respiratory:  ECTA B/L, Not using accessory muscles, speaking in full sentences- unlabored. Vascular:  Ext warm, no cyanosis apprec.; cap RF less 2 sec. No edema  Psych:  No HI/SI, judgement and insight good, Euthymic mood. Full Affect.   BP 122/83   Pulse 87   Temp 97.7 F (36.5 C)   Ht 5\' 4"  (1.626 m)   Wt 263 lb (119.3 kg)   SpO2 100%   BMI 45.14 kg/m  Wt Readings from Last 3 Encounters:  05/03/21 263 lb (119.3 kg)  10/20/20 254 lb 1.6 oz (115.3 kg)  04/16/20 254 lb (115.2 kg)     Health Maintenance Due  Topic Date Due  . COLONOSCOPY (Pts 45-24yrs Insurance coverage will need to be confirmed)  Never done  . PAP SMEAR-Modifier  03/28/2020    There are no preventive care reminders to display for this patient.  Lab Results  Component Value Date   TSH 0.758 04/16/2020   Lab Results  Component Value Date   WBC 7.4 04/16/2020   HGB 12.0 04/16/2020   HCT 36.7 04/16/2020   MCV 81 04/16/2020   PLT 242 04/16/2020   Lab Results  Component Value Date   NA 137 04/16/2020   K 4.2 04/16/2020   CO2 25 04/16/2020   GLUCOSE 91 04/16/2020   BUN 14 04/16/2020   CREATININE 0.65 04/16/2020   BILITOT 0.2 04/16/2020   ALKPHOS 76 04/16/2020   AST 15 04/16/2020   ALT 12 04/16/2020   PROT 6.6 04/16/2020   ALBUMIN 3.8 04/16/2020   CALCIUM 9.5 04/16/2020   ANIONGAP 8 05/18/2018   GFR 96.58 01/19/2016   Lab Results  Component Value Date   CHOL 160 04/16/2020   Lab Results  Component Value Date   HDL 48 04/16/2020   Lab Results  Component Value Date   LDLCALC 100 (H) 04/16/2020   Lab Results  Component Value Date   TRIG 61 04/16/2020   Lab Results  Component Value Date   CHOLHDL 3.3 04/16/2020   Lab Results  Component Value Date   HGBA1C 5.2 04/16/2020      Assessment & Plan:   Problem List Items Addressed This Visit      Cardiovascular and Mediastinum    Benign essential hypertension - Primary (Chronic)   Relevant Medications   hydrochlorothiazide (HYDRODIURIL) 25 MG tablet   olmesartan (BENICAR) 40 MG tablet     Respiratory   Allergic rhinitis   Relevant Medications   montelukast (SINGULAIR) 10 MG tablet     Other   Environmental allergies (Chronic)  Relevant Medications   montelukast (SINGULAIR) 10 MG tablet    Other Visit Diagnoses    Hematuria, unspecified type       Relevant Orders   POCT urinalysis dipstick (Completed)   Urine Culture   History of UTI       Relevant Orders   POCT urinalysis dipstick (Completed)   Urine Culture      Benign essential hypertension: -Controlled. Continue current medication regimen. Provided refills. -Continue ambulatory BP monitoring.  -Will collect CMP for medication monitoring with lab visit.  Allergic rhinitis, Environmental allergies: -Stable. Continue current medication regimen. Provided refill.  Hematuria: -UA collected today, trace of blood, negative for nitrites, leukocytes, protein, ketones, bilirubin and glucose, SG 1.025. Will send for urine culture to ensure UTI resolved. Patient asymptomatic. -Recommend to continue good hydration. -Recommend repeating UA in 4 weeks to reassess hematuria and will consider additional evaluation if continues to be present.    Meds ordered this encounter  Medications  . hydrochlorothiazide (HYDRODIURIL) 25 MG tablet    Sig: Take 1 tablet (25 mg total) by mouth daily.    Dispense:  90 tablet    Refill:  1    Order Specific Question:   Supervising Provider    Answer:   Beatrice Lecher D [[id number]]  . montelukast (SINGULAIR) 10 MG tablet    Sig: Take 1 tablet (10 mg total) by mouth at bedtime.    Dispense:  90 tablet    Refill:  1    Order Specific Question:   Supervising Provider    Answer:   Beatrice Lecher D [[id number]]  . olmesartan (BENICAR) 40 MG tablet    Sig: Take 1 tablet by mouth daily.    Dispense:  90 tablet    Refill:  1     Order Specific Question:   Supervising Provider    Answer:   Beatrice Lecher D [[id number]]    Follow-up: Return for CPE in 3-4 months; lab visit in 4 weeks to repeat UA, FBW.   Note:  This note was prepared with assistance of Dragon voice recognition software. Occasional wrong-word or sound-a-like substitutions may have occurred due to the inherent limitations of voice recognition software.  Lorrene Reid, PA-C

## 2021-05-05 LAB — URINE CULTURE

## 2021-05-14 ENCOUNTER — Encounter (HOSPITAL_BASED_OUTPATIENT_CLINIC_OR_DEPARTMENT_OTHER): Payer: Self-pay

## 2021-05-14 ENCOUNTER — Other Ambulatory Visit (HOSPITAL_BASED_OUTPATIENT_CLINIC_OR_DEPARTMENT_OTHER): Payer: Self-pay

## 2021-05-14 ENCOUNTER — Emergency Department (HOSPITAL_BASED_OUTPATIENT_CLINIC_OR_DEPARTMENT_OTHER): Payer: 59

## 2021-05-14 ENCOUNTER — Other Ambulatory Visit: Payer: Self-pay

## 2021-05-14 ENCOUNTER — Emergency Department (HOSPITAL_BASED_OUTPATIENT_CLINIC_OR_DEPARTMENT_OTHER)
Admission: EM | Admit: 2021-05-14 | Discharge: 2021-05-14 | Disposition: A | Discharge status: Home / Self Care | Payer: 59 | Attending: Emergency Medicine | Admitting: Emergency Medicine

## 2021-05-14 DIAGNOSIS — R350 Frequency of micturition: Secondary | ICD-10-CM | POA: Diagnosis not present

## 2021-05-14 DIAGNOSIS — M545 Low back pain, unspecified: Secondary | ICD-10-CM

## 2021-05-14 DIAGNOSIS — Z79899 Other long term (current) drug therapy: Secondary | ICD-10-CM | POA: Diagnosis not present

## 2021-05-14 DIAGNOSIS — Z7982 Long term (current) use of aspirin: Secondary | ICD-10-CM | POA: Diagnosis not present

## 2021-05-14 DIAGNOSIS — Z87891 Personal history of nicotine dependence: Secondary | ICD-10-CM | POA: Diagnosis not present

## 2021-05-14 DIAGNOSIS — K219 Gastro-esophageal reflux disease without esophagitis: Secondary | ICD-10-CM | POA: Diagnosis not present

## 2021-05-14 DIAGNOSIS — I10 Essential (primary) hypertension: Secondary | ICD-10-CM | POA: Insufficient documentation

## 2021-05-14 DIAGNOSIS — R1030 Lower abdominal pain, unspecified: Secondary | ICD-10-CM | POA: Insufficient documentation

## 2021-05-14 LAB — CBC WITH DIFFERENTIAL/PLATELET
Abs Immature Granulocytes: 0.02 10*3/uL (ref 0.00–0.07)
Basophils Absolute: 0 10*3/uL (ref 0.0–0.1)
Basophils Relative: 0 %
Eosinophils Absolute: 0.1 10*3/uL (ref 0.0–0.5)
Eosinophils Relative: 2 %
HCT: 37.1 % (ref 36.0–46.0)
Hemoglobin: 12.2 g/dL (ref 12.0–15.0)
Immature Granulocytes: 0 %
Lymphocytes Relative: 23 %
Lymphs Abs: 1.6 10*3/uL (ref 0.7–4.0)
MCH: 25.8 pg — ABNORMAL LOW (ref 26.0–34.0)
MCHC: 32.9 g/dL (ref 30.0–36.0)
MCV: 78.4 fL — ABNORMAL LOW (ref 80.0–100.0)
Monocytes Absolute: 0.8 10*3/uL (ref 0.1–1.0)
Monocytes Relative: 11 %
Neutro Abs: 4.5 10*3/uL (ref 1.7–7.7)
Neutrophils Relative %: 64 %
Platelets: 236 10*3/uL (ref 150–400)
RBC: 4.73 MIL/uL (ref 3.87–5.11)
RDW: 14.4 % (ref 11.5–15.5)
WBC: 7.1 10*3/uL (ref 4.0–10.5)
nRBC: 0 % (ref 0.0–0.2)

## 2021-05-14 LAB — BASIC METABOLIC PANEL
Anion gap: 4 — ABNORMAL LOW (ref 5–15)
BUN: 17 mg/dL (ref 6–20)
CO2: 27 mmol/L (ref 22–32)
Calcium: 9 mg/dL (ref 8.9–10.3)
Chloride: 105 mmol/L (ref 98–111)
Creatinine, Ser: 0.88 mg/dL (ref 0.44–1.00)
GFR, Estimated: >60 mL/min (ref >60)
Glucose, Bld: 91 mg/dL (ref 70–99)
Potassium: 3.5 mmol/L (ref 3.5–5.1)
Sodium: 136 mmol/L (ref 135–145)

## 2021-05-14 LAB — PREGNANCY, URINE: Preg Test, Ur: NEGATIVE

## 2021-05-14 LAB — URINALYSIS, ROUTINE W REFLEX MICROSCOPIC
Bilirubin Urine: NEGATIVE
Glucose, UA: NEGATIVE mg/dL
Ketones, ur: NEGATIVE mg/dL
Leukocytes,Ua: NEGATIVE
Nitrite: NEGATIVE
Protein, ur: NEGATIVE mg/dL
Specific Gravity, Urine: <1.005 — ABNORMAL LOW (ref 1.005–1.030)
pH: 6.5 (ref 5.0–8.0)

## 2021-05-14 LAB — URINALYSIS, MICROSCOPIC (REFLEX)

## 2021-05-14 MED ORDER — IBUPROFEN 400 MG PO TABS
600.0000 mg | ORAL_TABLET | Freq: Once | ORAL | Status: AC
  Administered 2021-05-14: 600 mg via ORAL
  Filled 2021-05-14: qty 1

## 2021-05-14 MED ORDER — CEPHALEXIN 500 MG PO CAPS
500.0000 mg | ORAL_CAPSULE | Freq: Four times a day (QID) | ORAL | 0 refills | Status: DC
  Filled 2021-05-14: qty 20, 5d supply, fill #0

## 2021-05-14 MED ORDER — IOHEXOL 300 MG/ML  SOLN
100.0000 mL | Freq: Once | INTRAMUSCULAR | Status: AC | PRN
  Administered 2021-05-14: 100 mL via INTRAVENOUS

## 2021-05-14 MED ORDER — CEPHALEXIN 500 MG PO CAPS: 500.0000 mg | ORAL_CAPSULE | Freq: Four times a day (QID) | ORAL | 0 refills | Status: DC

## 2021-05-14 NOTE — ED Triage Notes (Addendum)
Pt c/o lower back/abd/pelvic pain-states she has taken 2 rounds of abx for UTI in the last month-NAD-steady gait

## 2021-05-14 NOTE — ED Provider Notes (Signed)
[facility]PART[facility] Provider Note   CSN: [id number] Arrival date & time: 05/14/21  1228     History Chief Complaint  Patient presents with   Back Pain    Gina Farrell is a 50 y.o. female.  Patient presents with complaint of lower back pain and lower abdominal pain intermittent for the past 2 to 3 weeks.  She states about 3 weeks ago she had a urinary tract infection and finished a course of Macrobid and did well.  Several days later she had a recurrent sensation of urinary tract infection and saw her doctor again who prescribed Cipro the second time and she did well.  She had been doing well without symptoms until about 5 days ago when she started develop sensation of urinary tract infection again with urinary frequency lower abdominal pain and lower back pain.  Denies fevers or chills or vomiting or diarrhea.      Past Medical History:  Diagnosis Date   Allergic rhinitis    Chicken pox    GERD (gastroesophageal reflux disease)    Hypertension    Miscarriage 1998/2008   Vitamin D deficiency    Vitamin D deficiency     Patient Active Problem List   Diagnosis Date Noted   Recurrent right knee instability 09/08/2020   Flu-like symptoms 12/24/2018   Bronchitis 12/24/2018   Chronic pain of right knee 12/07/2018   Hip flexor tendinitis 12/07/2018   Hip flexor tendon tightness, left 12/07/2018   Muscle spasm of back- thoracic and lumbar L > er R 12/07/2018   Tinea corporis 12/07/2018   h/o Asymptomatic microscopic hematuria- in GYN office 12/20/2017   Encounter for wellness examination 12/20/2017   BMI 40.0-44.9, adult ([place]) 04/17/2017   Allergic rhinitis 09/18/2016   H/O tinea corporis 09/01/2016   Hypomagnesemia 09/01/2016   Hypertriglyceridemia 09/01/2016   GERD (gastroesophageal reflux disease) 01/13/2015   Environmental allergies    Hyperlipidemia    Vitamin D deficiency    Benign essential hypertension 02/27/2012    Past Surgical History:   Procedure Laterality Date   CESAREAN SECTION     x 2   d and c     TUBAL LIGATION       OB History   No obstetric history on file.     Family History  Problem Relation Age of Onset   Hypertension Mother    Stroke Mother    Atrial fibrillation Mother    Diabetes Mother    Rheum arthritis Maternal Grandmother    Coronary artery disease Maternal Grandmother    Hypertension Maternal Grandmother    Ulcerative colitis Father    Cancer Neg Hx     Social History   Tobacco Use   Smoking status: Former    Packs/day: 0.50    Years: 14.00    Pack years: 7.00    Types: Cigarettes    Quit date: 11/28/2001    Years since quitting: 19.4   Smokeless tobacco: Never  Vaping Use   Vaping Use: Never used  Substance Use Topics   Alcohol use: Yes    Comment: rare   Drug use: No    Home Medications Prior to Admission medications   Medication Sig Start Date End Date Taking? Authorizing Provider  aspirin 81 MG tablet Take 81 mg by mouth daily.    [provider]  cephALEXin (KEFLEX) 500 MG capsule Take 1 capsule (500 mg total) by mouth 4 (four) times daily. 05/14/21   Luna Fuse,  MD  Cholecalciferol (VITAMIN D3) 5000 units TABS Take 1 tablet by mouth daily.    [provider]  fluticasone (FLONASE) 50 MCG/ACT nasal spray Place into both nostrils daily.    [provider]  hydrochlorothiazide (HYDRODIURIL) 25 MG tablet Take 1 tablet (25 mg total) by mouth daily. 05/03/21   Abonza, Maritza, PA-C  montelukast (SINGULAIR) 10 MG tablet Take 1 tablet (10 mg total) by mouth at bedtime. 05/03/21   Lorrene Reid, PA-C  Multiple Vitamins-Minerals (MULTIVITAMIN PO) Take by mouth daily.    [provider]  olmesartan (BENICAR) 40 MG tablet Take 1 tablet by mouth daily. 05/03/21   Lorrene Reid, PA-C  Omega-3 Fatty Acids (FISH OIL) 1000 MG CAPS Take 100 mg by mouth daily.    [provider]    Allergies    Red yeast rice [cholestin]  Review of  Systems   Review of Systems  Constitutional:  Negative for fever.  HENT:  Negative for ear pain.   Eyes:  Negative for pain.  Respiratory:  Negative for cough.   Cardiovascular:  Negative for chest pain.  Gastrointestinal:  Positive for abdominal pain.  Genitourinary:  Negative for flank pain.  Musculoskeletal:  Positive for back pain.  Skin:  Negative for rash.  Neurological:  Negative for headaches.   Physical Exam Updated Vital Signs BP 135/79   Pulse 86   Temp 98.8 F (37.1 C) (Oral)   Resp 20   Ht 5\' 4"  (1.626 m)   Wt 119.7 kg   LMP 05/12/2021   SpO2 100%   BMI 45.32 kg/m   Physical Exam Constitutional:      General: She is not in acute distress.    Appearance: Normal appearance.  HENT:     Head: Normocephalic.     Nose: Nose normal.  Eyes:     Extraocular Movements: Extraocular movements intact.  Cardiovascular:     Rate and Rhythm: Normal rate.  Pulmonary:     Effort: Pulmonary effort is normal.  Abdominal:     Comments: No significant abdominal tenderness on exam.  No CVA tenderness noted.  Musculoskeletal:        General: Normal range of motion.     Cervical back: Normal range of motion.  Neurological:     General: No focal deficit present.     Mental Status: She is alert. Mental status is at baseline.    ED Results / Procedures / Treatments   Labs (all labs ordered are listed, but only abnormal results are displayed) Labs Reviewed  URINALYSIS, ROUTINE W REFLEX MICROSCOPIC - Abnormal; Notable for the following components:      Result Value   Color, Urine STRAW (*)    Specific Gravity, Urine <1.005 (*)    Hgb urine dipstick LARGE (*)    All other components within normal limits  CBC WITH DIFFERENTIAL/PLATELET - Abnormal; Notable for the following components:   MCV 78.4 (*)    MCH 25.8 (*)    All other components within normal limits  URINALYSIS, MICROSCOPIC (REFLEX) - Abnormal; Notable for the following components:   Bacteria, UA RARE (*)     All other components within normal limits  BASIC METABOLIC PANEL - Abnormal; Notable for the following components:   Anion gap 4 (*)    All other components within normal limits  PREGNANCY, URINE    EKG None  Radiology CT Abdomen Pelvis W Contrast  Result Date: 05/14/2021 CLINICAL DATA:  Acute nonlocalized abdominal pain. EXAM: CT ABDOMEN  AND PELVIS WITH CONTRAST TECHNIQUE: Multidetector CT imaging of the abdomen and pelvis was performed using the standard protocol following bolus administration of intravenous contrast. CONTRAST:  172mL OMNIPAQUE IOHEXOL 300 MG/ML  SOLN COMPARISON:  None. FINDINGS: Lower chest: Unremarkable. Hepatobiliary: No suspicious focal abnormality within the liver parenchyma. There is no evidence for gallstones, gallbladder wall thickening, or pericholecystic fluid. No intrahepatic or extrahepatic biliary dilation. Pancreas: No focal mass lesion. No dilatation of the main duct. No intraparenchymal cyst. No peripancreatic edema. Spleen: No splenomegaly. No focal mass lesion. Adrenals/Urinary Tract: No adrenal nodule or mass. Tiny hypoattenuating lesions in the right kidney are too small to characterize but likely benign. Left kidney unremarkable. No evidence for hydroureter. The urinary bladder appears normal for the degree of distention. Stomach/Bowel: Stomach is unremarkable. No gastric wall thickening. No evidence of outlet obstruction. Duodenum is normally positioned as is the ligament of Treitz. No small bowel wall thickening. No small bowel dilatation. The terminal ileum is normal. The appendix is normal. No gross colonic mass. No colonic wall thickening. Diverticular changes are noted in the left colon without evidence of diverticulitis. Vascular/Lymphatic: There is abdominal aortic atherosclerosis without aneurysm. There is no gastrohepatic or hepatoduodenal ligament lymphadenopathy. No retroperitoneal or mesenteric lymphadenopathy. No pelvic sidewall lymphadenopathy.  Reproductive: Probable uterine fibroids.  There is no adnexal mass. Other: No intraperitoneal free fluid. Musculoskeletal: No worrisome lytic or sclerotic osseous abnormality. IMPRESSION: 1. No acute findings in the abdomen or pelvis. Specifically, no findings to explain the patient's history of abdominal pain. 2. Left colonic diverticulosis without diverticulitis. 3. Probable uterine fibroids. 4. Aortic Atherosclerosis (ICD10-I70.0). Electronically Signed   By: Misty Stanley M.D.   On: 05/14/2021 14:50    Procedures Procedures   Medications Ordered in ED Medications  ibuprofen (ADVIL) tablet 600 mg (600 mg Oral Given 05/14/21 1330)  iohexol (OMNIPAQUE) 300 MG/ML solution 100 mL (100 mLs Intravenous Contrast Given 05/14/21 1404)    ED Course  I have reviewed the triage vital signs and the nursing notes.  Pertinent labs & imaging results that were available during my care of the patient were reviewed by me and considered in my medical decision making (see chart for details).    MDM Rules/Calculators/A&P                          Labs are sent white count normal hemoglobin normal.  Chemistry unremarkable.  Urinalysis shows rare bacteria unclear if this is remnant of bacteria given her multiple treatments.  CT imaging pursued was unremarkable.  Recommending outpatient follow-up with her doctors.  Causes may be due to some of the uterine fibroids noted or persistent urinary tract infection.  Patient has had no fevers however and normal white count.  Will recommend continued follow-up with her doctor within the week.  Advising return if she has fevers or worsening symptoms.  Final Clinical Impression(s) / ED Diagnoses Final diagnoses:  Acute bilateral low back pain without sciatica    Rx / DC Orders ED Discharge Orders          Ordered    cephALEXin (KEFLEX) 500 MG capsule  4 times daily,   Status:  Discontinued        05/14/21 1502    cephALEXin (KEFLEX) 500 MG capsule  4 times daily         05/14/21 1503             Luna Fuse, MD 05/14/21 1503

## 2021-05-14 NOTE — Discharge Instructions (Addendum)
Call your primary care doctor or specialist as discussed in the next 2-3 days.   Return immediately back to the ER if:  Your symptoms worsen within the next 12-24 hours. You develop new symptoms such as new fevers, persistent vomiting, new pain, shortness of breath, or new weakness or numbness, or if you have any other concerns.  

## 2021-05-14 NOTE — ED Notes (Signed)
Patient transported to CT 

## 2021-06-02 ENCOUNTER — Other Ambulatory Visit: Payer: Self-pay | Admitting: Physician Assistant

## 2021-06-02 DIAGNOSIS — Z0000 Encounter for general adult medical examination without abnormal findings: Secondary | ICD-10-CM

## 2021-06-02 DIAGNOSIS — I10 Essential (primary) hypertension: Secondary | ICD-10-CM

## 2021-06-02 DIAGNOSIS — E785 Hyperlipidemia, unspecified: Secondary | ICD-10-CM

## 2021-06-02 DIAGNOSIS — E559 Vitamin D deficiency, unspecified: Secondary | ICD-10-CM

## 2021-06-03 ENCOUNTER — Other Ambulatory Visit (INDEPENDENT_AMBULATORY_CARE_PROVIDER_SITE_OTHER): Payer: 59

## 2021-06-03 ENCOUNTER — Other Ambulatory Visit: Payer: Self-pay

## 2021-06-03 DIAGNOSIS — R319 Hematuria, unspecified: Secondary | ICD-10-CM | POA: Diagnosis not present

## 2021-06-03 DIAGNOSIS — E559 Vitamin D deficiency, unspecified: Secondary | ICD-10-CM

## 2021-06-03 DIAGNOSIS — I10 Essential (primary) hypertension: Secondary | ICD-10-CM

## 2021-06-03 DIAGNOSIS — E785 Hyperlipidemia, unspecified: Secondary | ICD-10-CM

## 2021-06-03 DIAGNOSIS — Z0000 Encounter for general adult medical examination without abnormal findings: Secondary | ICD-10-CM

## 2021-06-03 LAB — POCT URINALYSIS DIPSTICK
Bilirubin, UA: NEGATIVE
Glucose, UA: NEGATIVE
Ketones, UA: NEGATIVE
Leukocytes, UA: NEGATIVE
Nitrite, UA: NEGATIVE
Protein, UA: NEGATIVE
Spec Grav, UA: >=1.03 — AB (ref 1.010–1.025)
Urobilinogen, UA: 0.2 E.U./dL
pH, UA: 5 (ref 5.0–8.0)

## 2021-06-04 ENCOUNTER — Other Ambulatory Visit: Payer: Self-pay | Admitting: Physician Assistant

## 2021-06-04 DIAGNOSIS — R319 Hematuria, unspecified: Secondary | ICD-10-CM

## 2021-06-04 LAB — TSH: TSH: 1.2 u[IU]/mL (ref 0.450–4.500)

## 2021-06-04 LAB — CBC
Hematocrit: 39.5 % (ref 34.0–46.6)
Hemoglobin: 12.3 g/dL (ref 11.1–15.9)
MCH: 24.5 pg — ABNORMAL LOW (ref 26.6–33.0)
MCHC: 31.1 g/dL — ABNORMAL LOW (ref 31.5–35.7)
MCV: 79 fL (ref 79–97)
Platelets: 257 10*3/uL (ref 150–450)
RBC: 5.03 x10E6/uL (ref 3.77–5.28)
RDW: 15 % (ref 11.7–15.4)
WBC: 7.5 10*3/uL (ref 3.4–10.8)

## 2021-06-04 LAB — COMPREHENSIVE METABOLIC PANEL
ALT: 11 IU/L (ref 0–32)
AST: 14 IU/L (ref 0–40)
Albumin/Globulin Ratio: 1.6 (ref 1.2–2.2)
Albumin: 4 g/dL (ref 3.8–4.8)
Alkaline Phosphatase: 72 IU/L (ref 44–121)
BUN/Creatinine Ratio: 20 (ref 9–23)
BUN: 14 mg/dL (ref 6–24)
Bilirubin Total: 0.4 mg/dL (ref 0.0–1.2)
CO2: 22 mmol/L (ref 20–29)
Calcium: 9.2 mg/dL (ref 8.7–10.2)
Chloride: 99 mmol/L (ref 96–106)
Creatinine, Ser: 0.69 mg/dL (ref 0.57–1.00)
Globulin, Total: 2.5 g/dL (ref 1.5–4.5)
Glucose: 93 mg/dL (ref 65–99)
Potassium: 3.6 mmol/L (ref 3.5–5.2)
Sodium: 135 mmol/L (ref 134–144)
Total Protein: 6.5 g/dL (ref 6.0–8.5)
eGFR: 106 mL/min/{1.73_m2} (ref >59)

## 2021-06-04 LAB — LIPID PANEL
Chol/HDL Ratio: 3.6 ratio (ref 0.0–4.4)
Cholesterol, Total: 167 mg/dL (ref 100–199)
HDL: 46 mg/dL (ref >39)
LDL Chol Calc (NIH): 100 mg/dL — ABNORMAL HIGH (ref 0–99)
Triglycerides: 118 mg/dL (ref 0–149)
VLDL Cholesterol Cal: 21 mg/dL (ref 5–40)

## 2021-06-04 LAB — HEMOGLOBIN A1C
Est. average glucose Bld gHb Est-mCnc: 114 mg/dL
Hgb A1c MFr Bld: 5.6 % (ref 4.8–5.6)

## 2021-06-20 NOTE — Progress Notes (Signed)
06/21/2021 7:37 AM   Gina Farrell 1971-06-08 [id number]  Referring provider: Lorrene Reid, PA-C [place],  [place] [id number]  Chief Complaint  Patient presents with   Hematuria    HPI: Gina Farrell is a 50 y.o. female referred for evaluation of hematuria.  Onset UTI symptoms in May 2022 while working in [place] Symptoms frequency, urgency, dysuria and back pain Saw PCP 05/03/2021 requesting urine recheck due to a UTI treated by urgent care with 2 different antibiotics Dipstick urine culture at that visit showed trace blood, no microscopy was performed; urine culture was negative Had ED visit 05/14/2021 complaining of low back and lower abdominal pain UA at that visit showed large blood on dipstick however microscopy was negative CT abdomen/pelvis was unremarkable Follow-up urinalysis 06/03/2021 showed small blood on dipstick and no microscopy was performed Urgent care visit 04/09/2021 with complaints of frequency, urgency, dysuria, pelvic pain and low back pain Dipstick UA with positive leukocytes and blood Started on Macrobid Urine culture + Klebsiella which was intermediate to Macrobid Subsequently changed to Cipro with resolution of her voiding symptoms Urology eval recommended as dipstick urine was still positive for blood She is presently asymptomatic   PMH: Past Medical History:  Diagnosis Date   Allergic rhinitis    Chicken pox    GERD (gastroesophageal reflux disease)    Hypertension    Miscarriage 1998/2008   Vitamin D deficiency    Vitamin D deficiency     Surgical History: Past Surgical History:  Procedure Laterality Date   CESAREAN SECTION     x 2   d and c     TUBAL LIGATION      Home Medications:  Allergies as of 06/21/2021       Reactions   Red Yeast Rice [cholestin]    Cramps/Heartburn        Medication List        Accurate as of June 21, 2021 11:59 PM. If you have any questions, ask your nurse or doctor.           STOP taking these medications    cephALEXin 500 MG capsule Commonly known as: KEFLEX Stopped by: [name], MD       TAKE these medications    aspirin 81 MG tablet Take 81 mg by mouth daily.   Fish Oil 1000 MG Caps Take 100 mg by mouth daily.   fluticasone 50 MCG/ACT nasal spray Commonly known as: FLONASE Place into both nostrils daily.   hydrochlorothiazide 25 MG tablet Commonly known as: HYDRODIURIL Take 1 tablet (25 mg total) by mouth daily.   montelukast 10 MG tablet Commonly known as: SINGULAIR Take 1 tablet (10 mg total) by mouth at bedtime.   MULTIVITAMIN PO Take by mouth daily.   olmesartan 40 MG tablet Commonly known as: BENICAR Take 1 tablet by mouth daily.   Vitamin D3 125 MCG (5000 UT) Tabs Take 1 tablet by mouth daily.        Allergies:  Allergies  Allergen Reactions   Red Yeast Rice [Cholestin]     Cramps/Heartburn    Family History: Family History  Problem Relation Age of Onset   Hypertension Mother    Stroke Mother    Atrial fibrillation Mother    Diabetes Mother    Rheum arthritis Maternal Grandmother    Coronary artery disease Maternal Grandmother    Hypertension Maternal Grandmother    Ulcerative colitis Father    Cancer Neg Hx  Social History:  reports that she quit smoking about 19 years ago. Her smoking use included cigarettes. She has a 7.00 pack-year smoking history. She has never used smokeless tobacco. She reports current alcohol use. She reports that she does not use drugs.   Physical Exam: BP 123/83   Pulse (!) 109   Ht '5\' 4"'$  (1.626 m)   Wt 264 lb (119.7 kg)   BMI 45.32 kg/m   Constitutional:  Alert and oriented, No acute distress. HEENT: [place] AT, moist mucus membranes.  Trachea midline, no masses. Cardiovascular: No clubbing, cyanosis, or edema. Respiratory: Normal respiratory effort, no increased work of breathing. Skin: No rashes, bruises or suspicious lesions. Neurologic: Grossly  intact, no focal deficits, moving all 4 extremities. Psychiatric: Normal mood and affect.  Laboratory Data:  Urinalysis Microscopy 3-10 RBC   Pertinent Imaging: CT images were personally reviewed and interpreted    Assessment & Plan:    1.  Microhematuria Although prior urinalysis only showed dipstick + hematuria UA today with 3-10 RBC AUA hematuria risk stratification: High She has had upper tract imaging with recent CT abdomen/pelvis Cystoscopy recommended for lower tract evaluation. She was contacted with her UA results and recommendations of cystoscopy  2.  History UTI   [name], MD  [facility] [address], [place], [place] [address] [phone]

## 2021-06-21 ENCOUNTER — Ambulatory Visit: Payer: 59 | Admitting: Urology

## 2021-06-21 ENCOUNTER — Other Ambulatory Visit: Payer: Self-pay

## 2021-06-21 ENCOUNTER — Encounter: Payer: Self-pay | Admitting: Urology

## 2021-06-21 VITALS — BP 123/83 | HR 109 | Ht 64.0 in | Wt 264.0 lb

## 2021-06-21 DIAGNOSIS — R3129 Other microscopic hematuria: Secondary | ICD-10-CM

## 2021-06-21 DIAGNOSIS — Z87440 Personal history of urinary (tract) infections: Secondary | ICD-10-CM | POA: Diagnosis not present

## 2021-06-21 DIAGNOSIS — R310 Gross hematuria: Secondary | ICD-10-CM

## 2021-06-21 LAB — URINALYSIS, COMPLETE
Bilirubin, UA: NEGATIVE
Glucose, UA: NEGATIVE
Ketones, UA: NEGATIVE
Leukocytes,UA: NEGATIVE
Nitrite, UA: NEGATIVE
Protein,UA: NEGATIVE
Specific Gravity, UA: 1.025 (ref 1.005–1.030)
Urobilinogen, Ur: 0.2 mg/dL (ref 0.2–1.0)
pH, UA: 5 (ref 5.0–7.5)

## 2021-06-21 LAB — MICROSCOPIC EXAMINATION

## 2021-06-22 ENCOUNTER — Telehealth: Payer: Self-pay | Admitting: *Deleted

## 2021-06-22 NOTE — Telephone Encounter (Signed)
Notified patient as instructed, patient pleased. Discussed follow-up appointments, patient agrees, place order for renal imaging.

## 2021-06-22 NOTE — Telephone Encounter (Signed)
-----   Message from [name], MD sent at 06/22/2021  7:41 AM EDT ----- Please let patient know that urinalysis yesterday did show an abnormal amount of microscopic blood in urine.  The recommended evaluation would be renal imaging which she is already had with the CT.  Cystoscopy is also recommended and would recommend scheduling

## 2021-06-23 ENCOUNTER — Encounter: Payer: Self-pay | Admitting: Urology

## 2021-06-23 ENCOUNTER — Encounter: Payer: Self-pay | Admitting: Physician Assistant

## 2021-06-23 NOTE — Telephone Encounter (Signed)
CT has already been performed.  Just needs a cystoscopy appointment

## 2021-06-26 ENCOUNTER — Encounter: Payer: Self-pay | Admitting: Urology

## 2021-07-11 NOTE — Progress Notes (Signed)
   07/12/21  CC:  Chief Complaint  Patient presents with   Cysto    HPI: Refer to my prior note 06/21/2021.  Prior evaluations with dipstick positive urine however after office visit found to have 3-10 RBCs.  Noncontrast CT abdomen pelvis was unremarkable  Refer to rooming tab for vital signs NED. A&Ox3.   No respiratory distress   Abd soft, NT, ND Normal external genitalia with patent urethral meatus  Cystoscopy Procedure Note  Patient identification was confirmed, informed consent was obtained, and patient was prepped using Betadine solution.  Lidocaine jelly was administered per urethral meatus.    Procedure: - Flexible cystoscope introduced, without any difficulty.   - Thorough search of the bladder revealed:    normal urethral meatus    normal urothelium    no stones    no ulcers     no tumors    no urethral polyps    no trabeculation  - Ureteral orifices were normal in position and appearance.  Post-Procedure: - Patient tolerated the procedure well  Assessment/ Plan: No bladder coastal abnormalities UA today 1+ blood dipstick though no blood on microscopy 6 month follow-up for urinalysis and instructed call earlier for worsening symptoms    [name], MD

## 2021-07-12 ENCOUNTER — Encounter: Payer: Self-pay | Admitting: Urology

## 2021-07-12 ENCOUNTER — Ambulatory Visit: Payer: 59 | Admitting: Urology

## 2021-07-12 ENCOUNTER — Other Ambulatory Visit: Payer: Self-pay

## 2021-07-12 VITALS — BP 147/94 | HR 101 | Ht 64.0 in | Wt 265.0 lb

## 2021-07-12 DIAGNOSIS — R3129 Other microscopic hematuria: Secondary | ICD-10-CM

## 2021-07-12 LAB — URINALYSIS, COMPLETE
Bilirubin, UA: NEGATIVE
Glucose, UA: NEGATIVE
Ketones, UA: NEGATIVE
Leukocytes,UA: NEGATIVE
Nitrite, UA: NEGATIVE
Protein,UA: NEGATIVE
Specific Gravity, UA: 1.02 (ref 1.005–1.030)
Urobilinogen, Ur: 0.2 mg/dL (ref 0.2–1.0)
pH, UA: 5 (ref 5.0–7.5)

## 2021-07-12 LAB — MICROSCOPIC EXAMINATION

## 2021-07-29 ENCOUNTER — Other Ambulatory Visit: Payer: Self-pay | Admitting: Urology

## 2021-08-20 ENCOUNTER — Ambulatory Visit (INDEPENDENT_AMBULATORY_CARE_PROVIDER_SITE_OTHER): Payer: 59 | Admitting: Physician Assistant

## 2021-08-20 ENCOUNTER — Encounter: Payer: Self-pay | Admitting: Physician Assistant

## 2021-08-20 ENCOUNTER — Other Ambulatory Visit: Payer: Self-pay

## 2021-08-20 VITALS — BP 126/83 | HR 88 | Temp 98.3°F | Ht 64.0 in | Wt 262.9 lb

## 2021-08-20 DIAGNOSIS — Z0000 Encounter for general adult medical examination without abnormal findings: Secondary | ICD-10-CM | POA: Diagnosis not present

## 2021-08-20 DIAGNOSIS — E785 Hyperlipidemia, unspecified: Secondary | ICD-10-CM | POA: Diagnosis not present

## 2021-08-20 DIAGNOSIS — Z1211 Encounter for screening for malignant neoplasm of colon: Secondary | ICD-10-CM | POA: Diagnosis not present

## 2021-08-20 NOTE — Progress Notes (Signed)
Subjective:     Gina Farrell is a 50 y.o. female and is here for a comprehensive physical exam. The patient reports no problems.  Social History   Socioeconomic History   Marital status: Married    Spouse name: Not on file   Number of children: Not on file   Years of education: Not on file   Highest education level: Not on file  Occupational History   Not on file  Tobacco Use   Smoking status: Former    Packs/day: 0.50    Years: 14.00    Pack years: 7.00    Types: Cigarettes    Quit date: 11/28/2001    Years since quitting: 19.7   Smokeless tobacco: Never  Vaping Use   Vaping Use: Never used  Substance and Sexual Activity   Alcohol use: Yes    Comment: rare   Drug use: No   Sexual activity: Not on file  Other Topics Concern   Not on file  Social History Narrative   Not on file   Social Determinants of Health   Financial Resource Strain: Not on file  Food Insecurity: Not on file  Transportation Needs: Not on file  Physical Activity: Not on file  Stress: Not on file  Social Connections: Not on file  Intimate Partner Violence: Not on file   Health Maintenance  Topic Date Due   COLONOSCOPY (Pts 45-83yrs Insurance coverage will need to be confirmed)  Never done   PAP SMEAR-Modifier  03/28/2020   INFLUENZA VACCINE  06/28/2021   TETANUS/TDAP  07/29/2021   Hepatitis C Screening  10/20/2021 (Originally 12/18/1988)   COVID-19 Vaccine  Completed   HIV Screening  Completed   HPV VACCINES  Aged Out    The following portions of the patient's history were reviewed and updated as appropriate: allergies, current medications, past family history, past medical history, past social history, past surgical history, and problem list.  Review of Systems Pertinent items noted in HPI and remainder of comprehensive ROS otherwise negative.   Objective:    BP 126/83   Pulse 88   Temp 98.3 F (36.8 C)   Ht 5\' 4"  (1.626 m)   Wt 262 lb 14.4 oz (119.3 kg)   SpO2 98%   BMI 45.13  kg/m  General appearance: alert, cooperative, and no distress Head: Normocephalic, without obvious abnormality, atraumatic Eyes: conjunctivae/corneas clear. PERRL, EOM's intact. Fundi benign. Ears: normal TM's and external ear canals both ears Nose: Nares normal. Septum midline. Mucosa normal. No drainage or sinus tenderness. Throat: lips, mucosa, and tongue normal; teeth and gums normal Neck: no adenopathy, no JVD, supple, symmetrical, trachea midline, and thyroid: normal to inspection and palpation Back: symmetric, no curvature. ROM normal. No CVA tenderness. Lungs: clear to auscultation bilaterally Heart: regular rate and rhythm, S1, S2 normal, no murmur, click, rub or gallop Abdomen: soft, non-tender; bowel sounds normal; no masses,  no organomegaly Extremities: extremities normal, atraumatic, no cyanosis or edema Pulses: 2+ and symmetric Skin: Skin color, texture, turgor normal. No rashes or lesions Lymph nodes: Cervical adenopathy: normal and Supraclavicular adenopathy: normal Neurologic: Grossly normal    Assessment:    Healthy female exam.     Plan:  -Will obtain mammogram and pap results from [facility]. -Will place order for screening colonoscopy. -Encourage weight loss efforts with dietary and lifestyle changes. Follow a heart healthy diet. -Stay well hydrated. -BP stable. Continue current medication regimen. -Follow up in 6 months for HTN, HLD and FBW (lipid  panel, cmp)   See After Visit Summary for Counseling Recommendations

## 2021-08-20 NOTE — Patient Instructions (Signed)
Preventive Care 40-49 Years Old, Female Preventive care refers to lifestyle choices and visits with your health care provider that can promote health and wellness. This includes: A yearly physical exam. This is also called an annual wellness visit. Regular dental and eye exams. Immunizations. Screening for certain conditions. Healthy lifestyle choices, such as: Eating a healthy diet. Getting regular exercise. Not using drugs or products that contain nicotine and tobacco. Limiting alcohol use. What can I expect for my preventive care visit? Physical exam Your health care provider will check your: Height and weight. These may be used to calculate your BMI (body mass index). BMI is a measurement that tells if you are at a healthy weight. Heart rate and blood pressure. Body temperature. Skin for abnormal spots. Counseling Your health care provider may ask you questions about your: Past medical problems. Family's medical history. Alcohol, tobacco, and drug use. Emotional well-being. Home life and relationship well-being. Sexual activity. Diet, exercise, and sleep habits. Work and work environment. Access to firearms. Method of birth control. Menstrual cycle. Pregnancy history. What immunizations do I need? Vaccines are usually given at various ages, according to a schedule. Your health care provider will recommend vaccines for you based on your age, medical history, and lifestyle or other factors, such as travel or where you work. What tests do I need? Blood tests Lipid and cholesterol levels. These may be checked every 5 years, or more often if you are over 50 years old. Hepatitis C test. Hepatitis B test. Screening Lung cancer screening. You may have this screening every year starting at age 55 if you have a 30-pack-year history of smoking and currently smoke or have quit within the past 15 years. Colorectal cancer screening. All adults should have this screening starting at  age 50 and continuing until age 75. Your health care provider may recommend screening at age 45 if you are at increased risk. You will have tests every 1-10 years, depending on your results and the type of screening test. Diabetes screening. This is done by checking your blood sugar (glucose) after you have not eaten for a while (fasting). You may have this done every 1-3 years. Mammogram. This may be done every 1-2 years. Talk with your health care provider about when you should start having regular mammograms. This may depend on whether you have a family history of breast cancer. BRCA-related cancer screening. This may be done if you have a family history of breast, ovarian, tubal, or peritoneal cancers. Pelvic exam and Pap test. This may be done every 3 years starting at age 21. Starting at age 30, this may be done every 5 years if you have a Pap test in combination with an HPV test. Other tests STD (sexually transmitted disease) testing, if you are at risk. Bone density scan. This is done to screen for osteoporosis. You may have this scan if you are at high risk for osteoporosis. Talk with your health care provider about your test results, treatment options, and if necessary, the need for more tests. Follow these instructions at home: Eating and drinking  Eat a diet that includes fresh fruits and vegetables, whole grains, lean protein, and low-fat dairy products. Take vitamin and mineral supplements as recommended by your health care provider. Do not drink alcohol if: Your health care provider tells you not to drink. You are pregnant, may be pregnant, or are planning to become pregnant. If you drink alcohol: Limit how much you have to 0-1 drink a day. Be   aware of how much alcohol is in your drink. In the [place] one drink equals one 12 oz bottle of beer (355 mL), one 5 oz glass of wine (148 mL), or one 1 oz glass of hard liquor (44 mL). Lifestyle Take daily care of your teeth and  gums. Brush your teeth every morning and night with fluoride toothpaste. Floss one time each day. Stay active. Exercise for at least 30 minutes 5 or more days each week. Do not use any products that contain nicotine or tobacco, such as cigarettes, e-cigarettes, and chewing tobacco. If you need help quitting, ask your health care provider. Do not use drugs. If you are sexually active, practice safe sex. Use a condom or other form of protection to prevent STIs (sexually transmitted infections). If you do not wish to become pregnant, use a form of birth control. If you plan to become pregnant, see your health care provider for a prepregnancy visit. If told by your health care provider, take low-dose aspirin daily starting at age 54. Find healthy ways to cope with stress, such as: Meditation, yoga, or listening to music. Journaling. Talking to a trusted person. Spending time with friends and family. Safety Always wear your seat belt while driving or riding in a vehicle. Do not drive: If you have been drinking alcohol. Do not ride with someone who has been drinking. When you are tired or distracted. While texting. Wear a helmet and other protective equipment during sports activities. If you have firearms in your house, make sure you follow all gun safety procedures. What's next? Visit your health care provider once a year for an annual wellness visit. Ask your health care provider how often you should have your eyes and teeth checked. Stay up to date on all vaccines. This information is not intended to replace advice given to you by your health care provider. Make sure you discuss any questions you have with your health care provider. Document Revised: 01/22/2021 Document Reviewed: 07/26/2018 Elsevier Patient Education  2022 [employer].

## 2021-09-09 ENCOUNTER — Encounter: Payer: Self-pay | Admitting: Physician Assistant

## 2021-11-15 ENCOUNTER — Other Ambulatory Visit: Payer: Self-pay | Admitting: Physician Assistant

## 2021-11-15 DIAGNOSIS — J309 Allergic rhinitis, unspecified: Secondary | ICD-10-CM

## 2021-11-15 DIAGNOSIS — Z9109 Other allergy status, other than to drugs and biological substances: Secondary | ICD-10-CM

## 2021-11-15 DIAGNOSIS — I10 Essential (primary) hypertension: Secondary | ICD-10-CM

## 2021-11-19 IMAGING — CT CT ABD-PELV W/ CM
2 of 5 series · 16 of 46 positions shown, 18 images · IV contrast (Omnipaque)
Comparison: None.

CLINICAL DATA: Acute nonlocalized abdominal pain.

EXAM:
CT ABDOMEN AND PELVIS WITH CONTRAST
TECHNIQUE: Multidetector CT imaging of the abdomen and pelvis was performed
using the standard protocol following bolus administration of
intravenous contrast.
CONTRAST:  100mL OMNIPAQUE IOHEXOL 300 MG/ML  SOLN

[Series 2: axial st · axial · 0.98mm/px · z∈[+474,+884]mm · 13 of 92 slices shown, 15 images]
[im 5/92  soft-tissue]
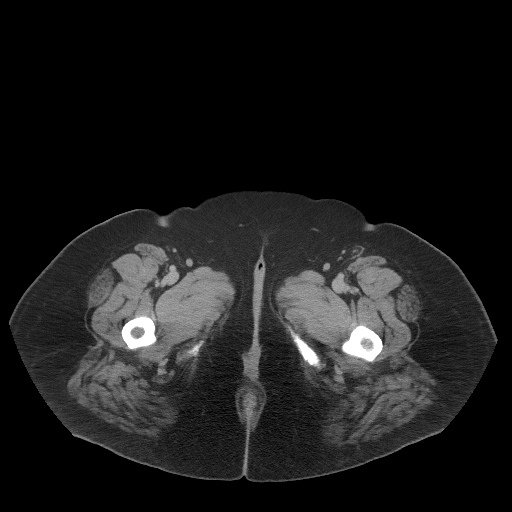
[im 5/92  bone]
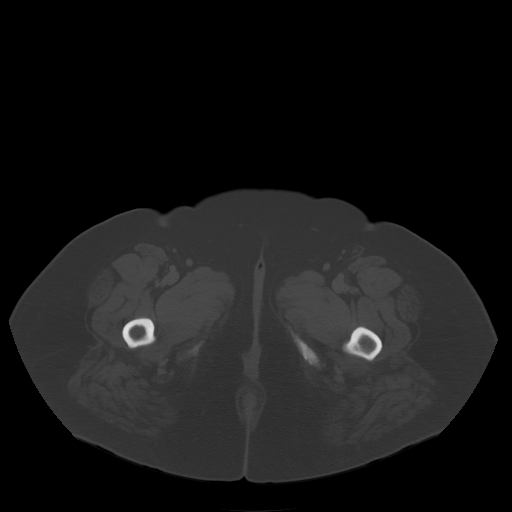
[im 15/92  soft-tissue]
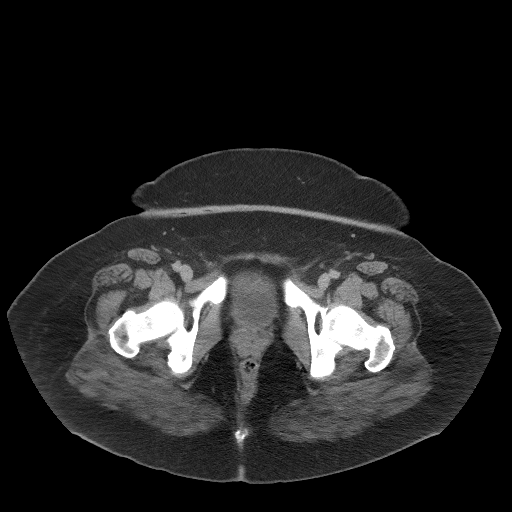
[im 20/92  soft-tissue]
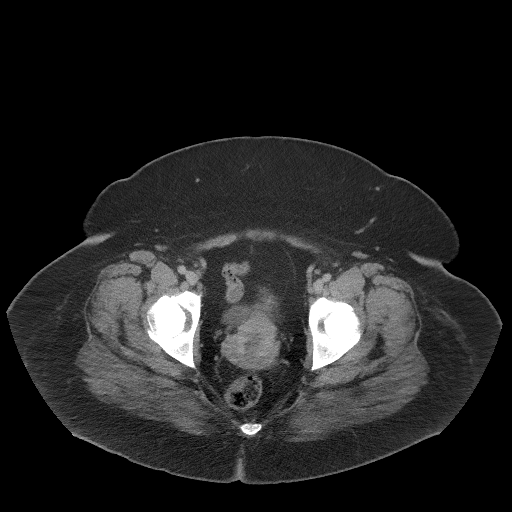
[im 24/92  soft-tissue]
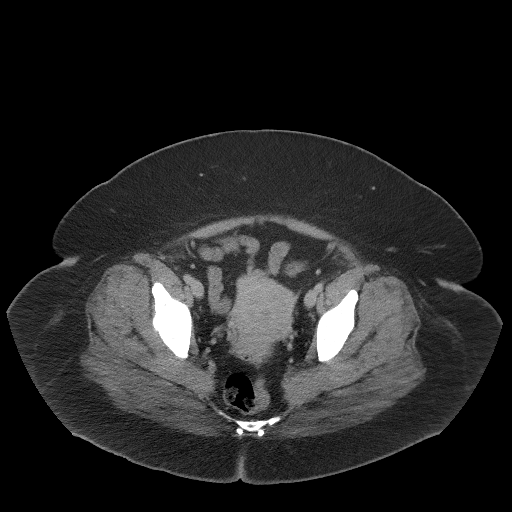
[im 34/92  soft-tissue]
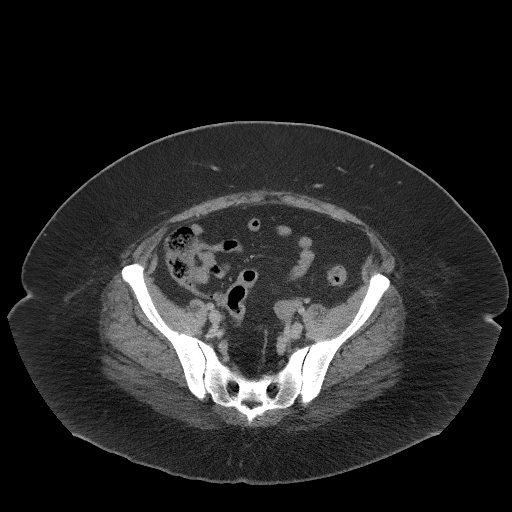
[im 39/92  soft-tissue]
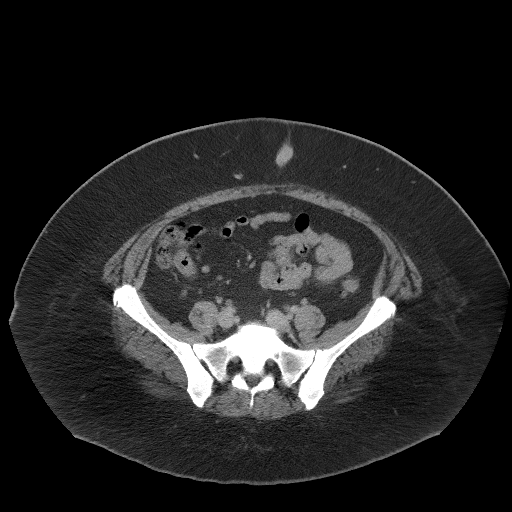
[im 48/92  soft-tissue]
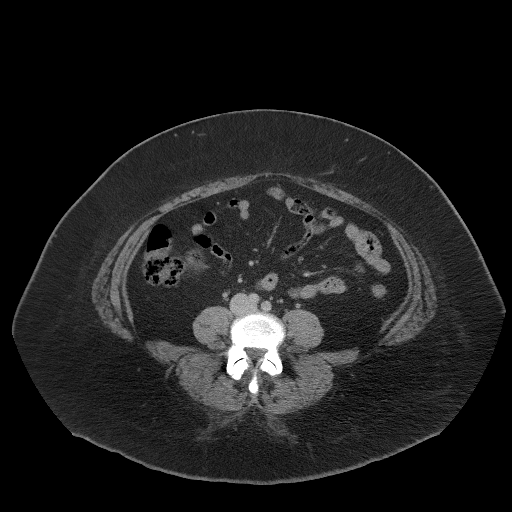
[im 53/92  soft-tissue]
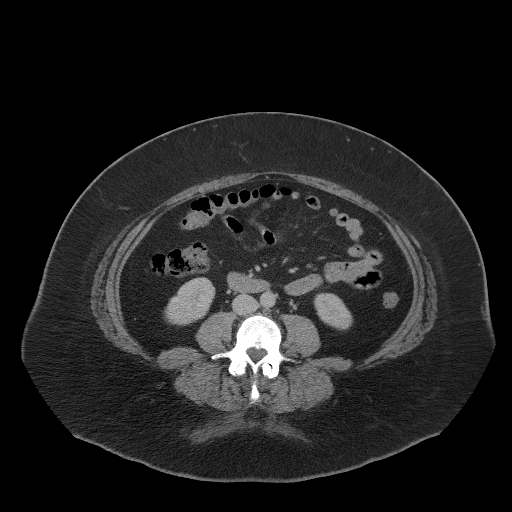
[im 58/92  soft-tissue]
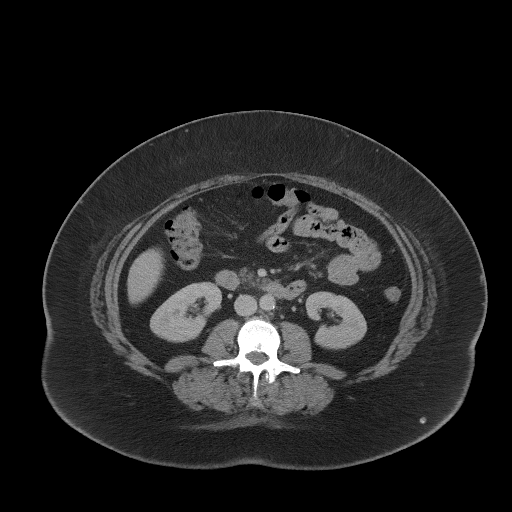
[im 58/92  bone]
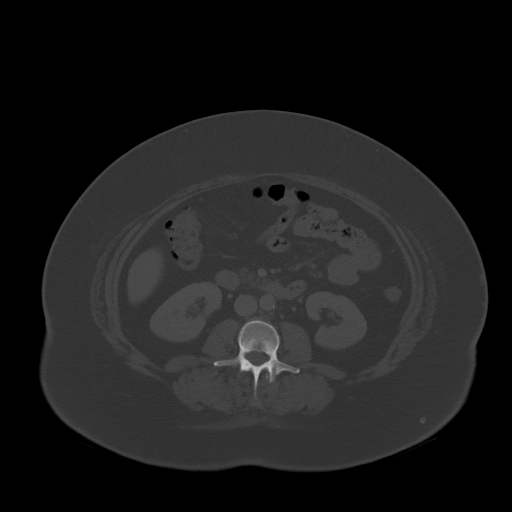
[im 68/92  soft-tissue]
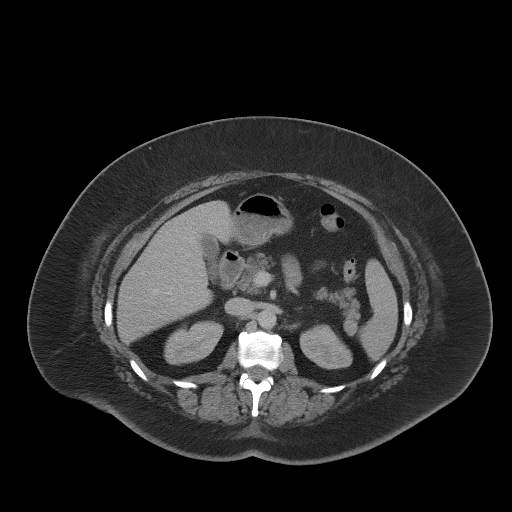
[im 72/92  soft-tissue]
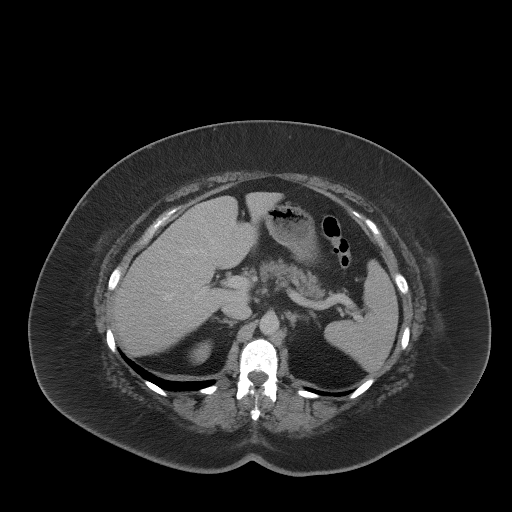
[im 77/92  soft-tissue]
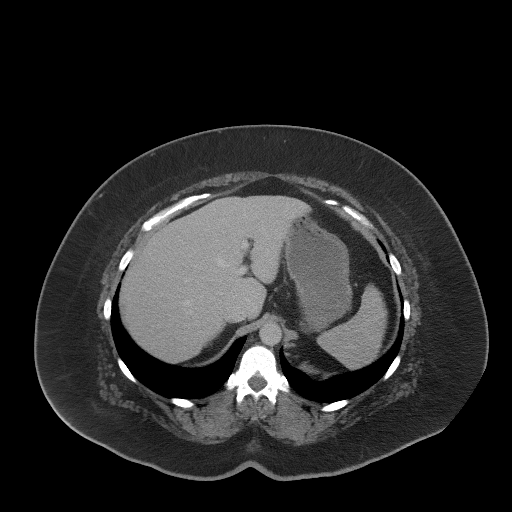
[im 87/92  soft-tissue]
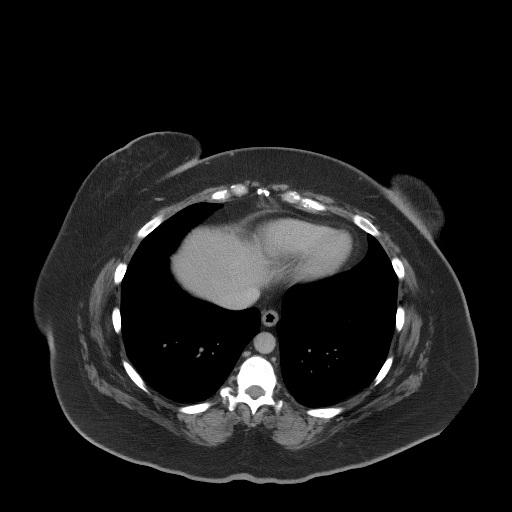

[Series 5: coronal st · coronal · 0.94mm/px · 3 of 116 slices shown]
[im 39/116  soft-tissue]
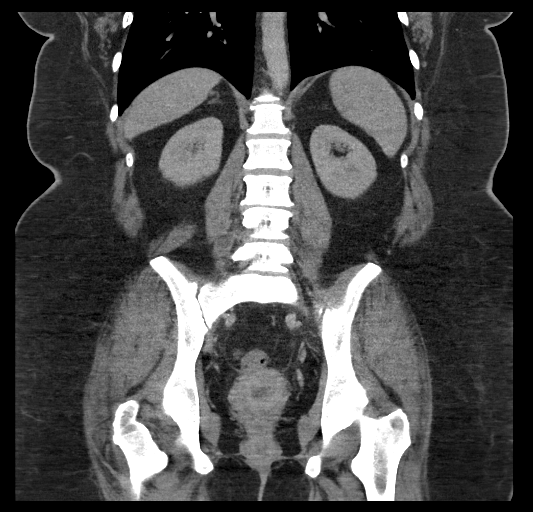
[im 52/116  soft-tissue]
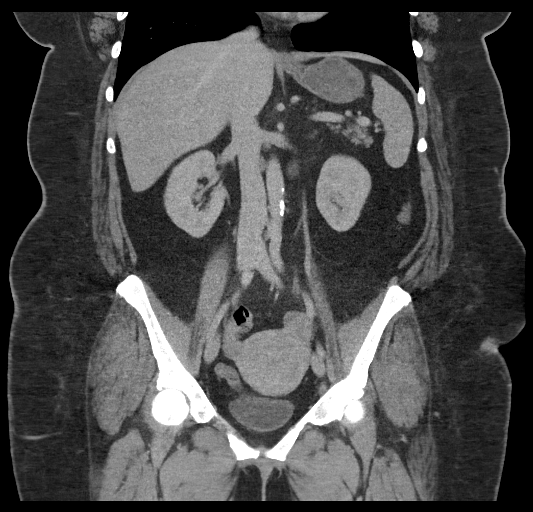
[im 64/116  soft-tissue]
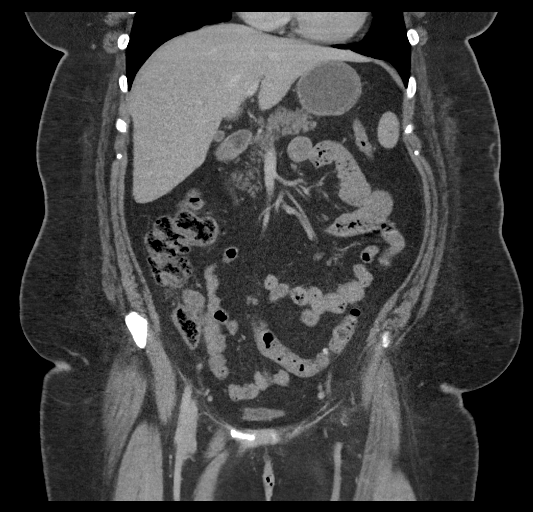

[16 of 46 positions shown; findings below may reference images not displayed]

FINDINGS: Lower chest: Unremarkable.

Hepatobiliary: No suspicious focal abnormality within the liver
parenchyma. There is no evidence for gallstones, gallbladder wall
thickening, or pericholecystic fluid. No intrahepatic or
extrahepatic biliary dilation.

Pancreas: No focal mass lesion. No dilatation of the main duct. No
intraparenchymal cyst. No peripancreatic edema.

Spleen: No splenomegaly. No focal mass lesion.

Adrenals/Urinary Tract: No adrenal nodule or mass. Tiny
hypoattenuating lesions in the right kidney are too small to
characterize but likely benign. Left kidney unremarkable. No
evidence for hydroureter. The urinary bladder appears normal for the
degree of distention.

Stomach/Bowel: Stomach is unremarkable. No gastric wall thickening.
No evidence of outlet obstruction. Duodenum is normally positioned
as is the ligament of Treitz. No small bowel wall thickening. No
small bowel dilatation. The terminal ileum is normal. The appendix
is normal. No gross colonic mass. No colonic wall thickening.
Diverticular changes are noted in the left colon without evidence of
diverticulitis.

Vascular/Lymphatic: There is abdominal aortic atherosclerosis
without aneurysm. There is no gastrohepatic or hepatoduodenal
ligament lymphadenopathy. No retroperitoneal or mesenteric
lymphadenopathy. No pelvic sidewall lymphadenopathy.

Reproductive: Probable uterine fibroids.  There is no adnexal mass.

Other: No intraperitoneal free fluid.

Musculoskeletal: No worrisome lytic or sclerotic osseous
abnormality.
IMPRESSION: 1. No acute findings in the abdomen or pelvis. Specifically, no
findings to explain the patient's history of abdominal pain.
2. Left colonic diverticulosis without diverticulitis.
3. Probable uterine fibroids.
4. Aortic Atherosclerosis ([id number]).

## 2021-11-25 ENCOUNTER — Encounter: Payer: Self-pay | Admitting: Family Medicine

## 2021-12-03 ENCOUNTER — Telehealth: Payer: Self-pay | Admitting: Gastroenterology

## 2021-12-03 ENCOUNTER — Encounter: Payer: Self-pay | Admitting: Gastroenterology

## 2021-12-03 NOTE — Telephone Encounter (Signed)
ERROR

## 2022-01-04 ENCOUNTER — Ambulatory Visit (AMBULATORY_SURGERY_CENTER): Payer: 59 | Admitting: *Deleted

## 2022-01-04 ENCOUNTER — Other Ambulatory Visit: Payer: Self-pay

## 2022-01-04 VITALS — Ht 64.0 in | Wt 259.0 lb

## 2022-01-04 DIAGNOSIS — Z1211 Encounter for screening for malignant neoplasm of colon: Secondary | ICD-10-CM

## 2022-01-04 DIAGNOSIS — Z800 Family history of malignant neoplasm of digestive organs: Secondary | ICD-10-CM

## 2022-01-04 MED ORDER — NA SULFATE-K SULFATE-MG SULF 17.5-3.13-1.6 GM/177ML PO SOLN: 1.0000 | Freq: Once | ORAL | 0 refills | Status: AC

## 2022-01-04 MED ORDER — ONDANSETRON HCL 4 MG PO TABS: 4.0000 mg | ORAL_TABLET | ORAL | 0 refills | Status: DC

## 2022-01-04 NOTE — Progress Notes (Signed)
No egg or soy allergy known to patient  No issues known to pt with past sedation with any surgeries or procedures Patient denies ever being told they had issues or difficulty with intubation  No FH of Malignant Hyperthermia Pt is not on diet pills Pt is not on  home 02  Pt is not on blood thinners  Pt denies issues with constipation  No A fib or A flutter  Pt is fully vaccinated  for Covid   NO PA's for preps discussed with pt In PV today  Discussed with pt there will be an out-of-pocket cost for prep and that varies from $0 to 70 +  dollars - pt verbalized understanding   Due to the COVID-19 pandemic we are asking patients to follow certain guidelines in PV and the [place]   Pt aware of COVID protocols and [facility] guidelines   PV completed over the phone. Pt verified name, DOB, address and insurance during PV today.  Pt encouraged to call with questions or issues.  If pt has My chart, procedure instructions sent via My Chart

## 2022-01-13 ENCOUNTER — Ambulatory Visit: Payer: 59 | Admitting: Urology

## 2022-01-14 ENCOUNTER — Ambulatory Visit: Payer: 59 | Admitting: Urology

## 2022-01-18 ENCOUNTER — Encounter: Payer: 59 | Admitting: Gastroenterology

## 2022-01-18 ENCOUNTER — Other Ambulatory Visit: Payer: Self-pay | Admitting: Physician Assistant

## 2022-01-18 DIAGNOSIS — Z9109 Other allergy status, other than to drugs and biological substances: Secondary | ICD-10-CM

## 2022-01-18 DIAGNOSIS — I10 Essential (primary) hypertension: Secondary | ICD-10-CM

## 2022-01-18 DIAGNOSIS — J309 Allergic rhinitis, unspecified: Secondary | ICD-10-CM

## 2022-02-17 ENCOUNTER — Other Ambulatory Visit: Payer: Self-pay

## 2022-02-17 ENCOUNTER — Encounter: Payer: Self-pay | Admitting: Physician Assistant

## 2022-02-17 ENCOUNTER — Ambulatory Visit: Payer: 59 | Admitting: Physician Assistant

## 2022-02-17 VITALS — BP 127/82 | HR 86 | Temp 98.0°F | Ht 64.0 in | Wt 260.0 lb

## 2022-02-17 DIAGNOSIS — J309 Allergic rhinitis, unspecified: Secondary | ICD-10-CM | POA: Diagnosis not present

## 2022-02-17 DIAGNOSIS — I10 Essential (primary) hypertension: Secondary | ICD-10-CM

## 2022-02-17 DIAGNOSIS — Z9109 Other allergy status, other than to drugs and biological substances: Secondary | ICD-10-CM

## 2022-02-17 DIAGNOSIS — K219 Gastro-esophageal reflux disease without esophagitis: Secondary | ICD-10-CM

## 2022-02-17 DIAGNOSIS — E785 Hyperlipidemia, unspecified: Secondary | ICD-10-CM

## 2022-02-17 MED ORDER — MONTELUKAST SODIUM 10 MG PO TABS: 10.0000 mg | ORAL_TABLET | Freq: Every day | ORAL | 1 refills | Status: DC

## 2022-02-17 MED ORDER — OLMESARTAN MEDOXOMIL 40 MG PO TABS: 40.0000 mg | ORAL_TABLET | Freq: Every day | ORAL | 1 refills | Status: DC

## 2022-02-17 MED ORDER — HYDROCHLOROTHIAZIDE 25 MG PO TABS: 25.0000 mg | ORAL_TABLET | Freq: Every day | ORAL | 1 refills | Status: DC

## 2022-02-17 NOTE — Assessment & Plan Note (Signed)
-  Stable. Recommend to avoid provocative foods. Continue H2 blocker as needed for acute symptoms. Will continue to monitor. ?

## 2022-02-17 NOTE — Assessment & Plan Note (Signed)
-  Last lipid panel: HDL 46, LDL 100 ?-The 10-year ASCVD risk score (Arnett DK, et al., 2019) is: 1.6% ?-Will repeat lipid panel today. ?-Recommend to follow a heart healthy diet low in fat. ?-Will continue to monitor. ?

## 2022-02-17 NOTE — Assessment & Plan Note (Signed)
-  Stable. Continue montelukast 10 mg daily, oral antihistamine and nasal spray when needed. Will continue to monitor. ?

## 2022-02-17 NOTE — Assessment & Plan Note (Signed)
-  Stable. ?-Continue daily montelukast, oral antihistamine and nasal spray as needed. ?-Will continue to monitor. ?

## 2022-02-17 NOTE — Assessment & Plan Note (Signed)
-  Controlled. Continue current medication regimen. Will continue to monitor. 

## 2022-02-17 NOTE — Progress Notes (Signed)
? ?Established Patient Office Visit ? ?Subjective:  ?Patient ID: Gina Farrell, female    DOB: 1971/08/25  Age: 51 y.o. MRN: [id number] ? ?CC:  ?Chief Complaint  ?Patient presents with  ? Follow-up  ? Hypertension  ? ? ?HPI ?Gina Farrell presents for follow-up on hypertension and hyperlipidemia.  ? ?HTN: Pt denies chest pain, palpitations, dizziness or headache. Taking medication as directed without side effects. Checks BP at home and readings range in 120-124/80-86. Monitors sodium intake. ? ?GERD: Patient reports does not have daily symptoms. Will occasionally have a flare-up if eats something spicy and will take Tums or famotidine which help resolve her symptoms. ? ?HLD: Pt denies changes with diet. Trying to monitor fat intake. Reports excited about using their new home pool once the weather gets warmer.  ? ?Allergies: Reports symptoms have been controlled. No exacerbations. Taking montelukast as directed. ? ?Past Medical History:  ?Diagnosis Date  ? Allergic rhinitis   ? Allergy   ? Chicken pox   ? GERD (gastroesophageal reflux disease)   ? Hypertension   ? Miscarriage 1998/2008  ? Vitamin D deficiency   ? past hx - controlled on vitamin D  ? ? ?Past Surgical History:  ?Procedure Laterality Date  ? CESAREAN SECTION    ? x 2  ? d and c    ? TUBAL LIGATION    ? ? ?Family History  ?Problem Relation Age of Onset  ? Hypertension Mother   ? Stroke Mother   ? Atrial fibrillation Mother   ? Diabetes Mother   ? Colon cancer Father   ?     recent dx at age 60  ? Ulcerative colitis Father   ? Rheum arthritis Maternal Grandmother   ? Coronary artery disease Maternal Grandmother   ? Hypertension Maternal Grandmother   ? Cancer Neg Hx   ? Colon polyps Neg Hx   ? Esophageal cancer Neg Hx   ? Stomach cancer Neg Hx   ? Rectal cancer Neg Hx   ? ? ?Social History  ? ?Socioeconomic History  ? Marital status: Married  ?  Spouse name: Not on file  ? Number of children: Not on file  ? Years of education: Not on file  ? Highest  education level: Not on file  ?Occupational History  ? Not on file  ?Tobacco Use  ? Smoking status: Former  ?  Packs/day: 0.50  ?  Years: 14.00  ?  Pack years: 7.00  ?  Types: Cigarettes  ?  Quit date: 11/28/2001  ?  Years since quitting: 20.2  ? Smokeless tobacco: Never  ?Vaping Use  ? Vaping Use: Never used  ?Substance and Sexual Activity  ? Alcohol use: Not Currently  ? Drug use: No  ? Sexual activity: Not on file  ?Other Topics Concern  ? Not on file  ?Social History Narrative  ? Not on file  ? ?Social Determinants of Health  ? ?Financial Resource Strain: Not on file  ?Food Insecurity: Not on file  ?Transportation Needs: Not on file  ?Physical Activity: Not on file  ?Stress: Not on file  ?Social Connections: Not on file  ?Intimate Partner Violence: Not on file  ? ? ?Outpatient Medications Prior to Visit  ?Medication Sig Dispense Refill  ? Cholecalciferol (VITAMIN D3) 5000 units TABS Take 1 tablet by mouth daily.    ? Cyanocobalamin (VITAMIN B 12 PO) Take by mouth.    ? ipratropium (ATROVENT) 0.06 % nasal spray     ?  Multiple Vitamins-Minerals (MULTIVITAMIN PO) Take by mouth daily.    ? Na Sulfate-K Sulfate-Mg Sulf 17.5-3.13-1.6 GM/177ML SOLN See admin instructions.    ? Omega-3 Fatty Acids (FISH OIL) 1000 MG CAPS Take 100 mg by mouth daily.    ? ondansetron (ZOFRAN) 4 MG tablet Take 1 tablet (4 mg total) by mouth as directed. 2 tablet 0  ? hydrochlorothiazide (HYDRODIURIL) 25 MG tablet TAKE 1 TABLET (25 MG TOTAL) BY MOUTH DAILY. 30 tablet 1  ? montelukast (SINGULAIR) 10 MG tablet TAKE 1 TABLET (10 MG TOTAL) BY MOUTH AT BEDTIME. 30 tablet 1  ? olmesartan (BENICAR) 40 MG tablet TAKE 1 TABLET BY MOUTH DAILY. 30 tablet 1  ? ?No facility-administered medications prior to visit.  ? ? ?Allergies  ?Allergen Reactions  ? Red Yeast Rice [Cholestin]   ?  Cramps/Heartburn  ? ? ?ROS ?Review of Systems ?Review of Systems:  ?A fourteen system review of systems was performed and found to be positive as per HPI. ? ?  ?Objective:   ?  ?Physical Exam ?General:  Pleasant and cooperative, appropriate for stated age.  ?Neuro:  Alert and oriented,  extra-ocular muscles intact  ?HEENT:  Normocephalic, atraumatic, neck supple  ?Skin:  no gross rash, warm, pink. ?Cardiac:  RRR, S1 S2 ?Respiratory: CTA B/L  ?Vascular:  Ext warm, no cyanosis apprec.; cap RF less 2 sec. ?Psych:  No HI/SI, judgement and insight good, Euthymic mood. Full Affect. ? ?BP 127/82   Pulse 86   Temp 98 ?F (36.7 ?C)   Ht 5' 4"  (1.626 m)   Wt 260 lb (117.9 kg)   SpO2 98%   BMI 44.63 kg/m?  ?Wt Readings from Last 3 Encounters:  ?02/17/22 260 lb (117.9 kg)  ?01/04/22 259 lb (117.5 kg)  ?08/20/21 262 lb 14.4 oz (119.3 kg)  ? ? ? ?Health Maintenance Due  ?Topic Date Due  ? Hepatitis C Screening  Never done  ? COLONOSCOPY (Pts 45-21yr Insurance coverage will need to be confirmed)  Never done  ? COVID-19 Vaccine (4 - Booster for Pfizer series) 11/12/2020  ? INFLUENZA VACCINE  06/28/2021  ? TETANUS/TDAP  07/29/2021  ? Zoster Vaccines- Shingrix (1 of 2) Never done  ? ? ?There are no preventive care reminders to display for this patient. ? ?Lab Results  ?Component Value Date  ? TSH 1.200 06/03/2021  ? ?Lab Results  ?Component Value Date  ? WBC 7.5 06/03/2021  ? HGB 12.3 06/03/2021  ? HCT 39.5 06/03/2021  ? MCV 79 06/03/2021  ? PLT 257 06/03/2021  ? ?Lab Results  ?Component Value Date  ? NA 135 06/03/2021  ? K 3.6 06/03/2021  ? CO2 22 06/03/2021  ? GLUCOSE 93 06/03/2021  ? BUN 14 06/03/2021  ? CREATININE 0.69 06/03/2021  ? BILITOT 0.4 06/03/2021  ? ALKPHOS 72 06/03/2021  ? AST 14 06/03/2021  ? ALT 11 06/03/2021  ? PROT 6.5 06/03/2021  ? ALBUMIN 4.0 06/03/2021  ? CALCIUM 9.2 06/03/2021  ? ANIONGAP 4 (L) 05/14/2021  ? EGFR 106 06/03/2021  ? GFR 96.58 01/19/2016  ? ?Lab Results  ?Component Value Date  ? CHOL 167 06/03/2021  ? ?Lab Results  ?Component Value Date  ? HDL 46 06/03/2021  ? ?Lab Results  ?Component Value Date  ? LDLCALC 100 (H) 06/03/2021  ? ?Lab Results  ?Component Value  Date  ? TRIG 118 06/03/2021  ? ?Lab Results  ?Component Value Date  ? CHOLHDL 3.6 06/03/2021  ? ?Lab Results  ?Component Value Date  ?  HGBA1C 5.6 06/03/2021  ? ? ?  ?Assessment & Plan:  ? ?Problem List Items Addressed This Visit   ? ?  ? Cardiovascular and Mediastinum  ? Benign essential hypertension (Chronic)  ?  -Controlled. ?-Continue current medication regimen. ?-Will continue to monitor. ?  ?  ? Relevant Medications  ? hydrochlorothiazide (HYDRODIURIL) 25 MG tablet  ? olmesartan (BENICAR) 40 MG tablet  ? Other Relevant Orders  ? Comprehensive metabolic panel  ?  ? Respiratory  ? Allergic rhinitis  ?  -Stable. Continue montelukast 10 mg daily, oral antihistamine and nasal spray when needed. Will continue to monitor. ?  ?  ? Relevant Medications  ? montelukast (SINGULAIR) 10 MG tablet  ?  ? Digestive  ? GERD (gastroesophageal reflux disease) (Chronic)  ?  -Stable. Recommend to avoid provocative foods. Continue H2 blocker as needed for acute symptoms. Will continue to monitor. ?  ?  ? Relevant Medications  ? Na Sulfate-K Sulfate-Mg Sulf 17.5-3.13-1.6 GM/177ML SOLN  ?  ? Other  ? Environmental allergies (Chronic)  ?  -Stable. ?-Continue daily montelukast, oral antihistamine and nasal spray as needed. ?-Will continue to monitor. ?  ?  ? Relevant Medications  ? montelukast (SINGULAIR) 10 MG tablet  ? Hyperlipidemia - Primary (Chronic)  ?  -Last lipid panel: HDL 46, LDL 100 ?-The 10-year ASCVD risk score (Arnett DK, et al., 2019) is: 1.6% ?-Will repeat lipid panel today. ?-Recommend to follow a heart healthy diet low in fat. ?-Will continue to monitor. ?  ?  ? Relevant Medications  ? hydrochlorothiazide (HYDRODIURIL) 25 MG tablet  ? olmesartan (BENICAR) 40 MG tablet  ? Other Relevant Orders  ? Lipid panel  ? ? ?Meds ordered this encounter  ?Medications  ? hydrochlorothiazide (HYDRODIURIL) 25 MG tablet  ?  Sig: Take 1 tablet (25 mg total) by mouth daily.  ?  Dispense:  90 tablet  ?  Refill:  1  ? montelukast  (SINGULAIR) 10 MG tablet  ?  Sig: Take 1 tablet (10 mg total) by mouth at bedtime.  ?  Dispense:  90 tablet  ?  Refill:  1  ? olmesartan (BENICAR) 40 MG tablet  ?  Sig: Take 1 tablet (40 mg total) by mouth daily.  ?  Dispense:

## 2022-02-18 LAB — LIPID PANEL
Chol/HDL Ratio: 3.7 ratio (ref 0.0–4.4)
Cholesterol, Total: 180 mg/dL (ref 100–199)
HDL: 49 mg/dL (ref >39)
LDL Chol Calc (NIH): 117 mg/dL — ABNORMAL HIGH (ref 0–99)
Triglycerides: 74 mg/dL (ref 0–149)
VLDL Cholesterol Cal: 14 mg/dL (ref 5–40)

## 2022-02-18 LAB — COMPREHENSIVE METABOLIC PANEL
ALT: 10 IU/L (ref 0–32)
AST: 12 IU/L (ref 0–40)
Albumin/Globulin Ratio: 1.6 (ref 1.2–2.2)
Albumin: 4 g/dL (ref 3.8–4.8)
Alkaline Phosphatase: 68 IU/L (ref 44–121)
BUN/Creatinine Ratio: 20 (ref 9–23)
BUN: 14 mg/dL (ref 6–24)
Bilirubin Total: 0.3 mg/dL (ref 0.0–1.2)
CO2: 24 mmol/L (ref 20–29)
Calcium: 9.6 mg/dL (ref 8.7–10.2)
Chloride: 102 mmol/L (ref 96–106)
Creatinine, Ser: 0.71 mg/dL (ref 0.57–1.00)
Globulin, Total: 2.5 g/dL (ref 1.5–4.5)
Glucose: 97 mg/dL (ref 70–99)
Potassium: 4.1 mmol/L (ref 3.5–5.2)
Sodium: 137 mmol/L (ref 134–144)
Total Protein: 6.5 g/dL (ref 6.0–8.5)
eGFR: 104 mL/min/{1.73_m2} (ref >59)

## 2022-03-11 ENCOUNTER — Encounter: Payer: Self-pay | Admitting: Physician Assistant

## 2022-03-11 ENCOUNTER — Ambulatory Visit (INDEPENDENT_AMBULATORY_CARE_PROVIDER_SITE_OTHER): Payer: 59 | Admitting: Physician Assistant

## 2022-03-11 VITALS — BP 115/80 | HR 82 | Temp 98.3°F | Ht 64.0 in | Wt 264.1 lb

## 2022-03-11 DIAGNOSIS — H60393 Other infective otitis externa, bilateral: Secondary | ICD-10-CM

## 2022-03-11 MED ORDER — CIPROFLOXACIN-DEXAMETHASONE 0.3-0.1 % OT SUSP: 4.0000 [drp] | Freq: Two times a day (BID) | OTIC | 0 refills | Status: DC

## 2022-03-11 NOTE — Progress Notes (Signed)
?  Established patient acute visit ? ? ?Patient: Gina Farrell   DOB: 1971-05-27   51 y.o. Female  MRN: [id number] ?Visit Date: 03/11/2022 ? ?Chief Complaint  ?Patient presents with  ? Ear Pain  ? ?Subjective  ?  ?HPI  ?Patient present with c/o bilateral ear pressure, L>R x 3-4 days. States has been experiencing allergies - itchy and watery eyes, sinus pressure and headache intermittently for about 2 weeks. No fever, cough, ST, otorrhea. When yawning feels like a cracking sensation. ? ? ? ? ?Medications: ?Outpatient Medications Prior to Visit  ?Medication Sig  ? Cholecalciferol (VITAMIN D3) 5000 units TABS Take 1 tablet by mouth daily.  ? Cyanocobalamin (VITAMIN B 12 PO) Take by mouth.  ? hydrochlorothiazide (HYDRODIURIL) 25 MG tablet Take 1 tablet (25 mg total) by mouth daily.  ? montelukast (SINGULAIR) 10 MG tablet Take 1 tablet (10 mg total) by mouth at bedtime.  ? Multiple Vitamins-Minerals (MULTIVITAMIN PO) Take by mouth daily.  ? Na Sulfate-K Sulfate-Mg Sulf 17.5-3.13-1.6 GM/177ML SOLN See admin instructions.  ? olmesartan (BENICAR) 40 MG tablet Take 1 tablet (40 mg total) by mouth daily.  ? Omega-3 Fatty Acids (FISH OIL) 1000 MG CAPS Take 100 mg by mouth daily.  ? ondansetron (ZOFRAN) 4 MG tablet Take 1 tablet (4 mg total) by mouth as directed.  ? ipratropium (ATROVENT) 0.06 % nasal spray  (Patient not taking: Reported on 03/11/2022)  ? ?No facility-administered medications prior to visit.  ? ? ?Review of Systems ?Review of Systems:  ?A fourteen system review of systems was performed and found to be positive as per HPI. ? ? ?  Objective  ?  ?BP 115/80   Pulse 82   Temp 98.3 ?F (36.8 ?C)   Ht '5\' 4"'$  (1.626 m)   Wt 264 lb 1.6 oz (119.8 kg)   LMP 02/18/2022   SpO2 98%   BMI 45.33 kg/m?  ? ? ?Physical Exam  ?General:  Well Developed, well nourished, appropriate for stated age.  ?Neuro:  Alert and oriented,  extra-ocular muscles intact  ?HEENT:  Normocephalic, atraumatic, frontal sinus tenderness, no maxillary  sinus tenderness, PERRL, neck supple, mildly opaque TM's of both ears, erythema of right external canal, normal external canal of left ear, positive Tragus and Pinna sign bilaterally, pale nasal mucosa, no adenopathy   ?Skin:  no gross rash, warm, pink. ?Cardiac:  RRR, S1 S2 ?Respiratory: CTA B/L  ?Vascular:  Ext warm, no cyanosis apprec.; cap RF less 2 sec. ?Psych:  No HI/SI, judgement and insight good, Euthymic mood. Full Affect. ? ? ?No results found for any visits on 03/11/22. ? Assessment & Plan  ?  ? ?Patient has s/sx highly suggestive of infective AOE so will start otic antibiotic drops with CiproDex. Recommend to continue with supportive care including decongestant. Also recommend to use saline nasal spray.  ? ? ?Return if symptoms worsen or fail to improve.  ?   ? ? ? ?Lorrene Reid, PA-C  ?[facility] ?[phone] (phone) [phone] (fax) ? ?[facility] ?

## 2022-03-15 ENCOUNTER — Encounter: Payer: Self-pay | Admitting: Physician Assistant

## 2022-03-15 ENCOUNTER — Other Ambulatory Visit: Payer: Self-pay | Admitting: Physician Assistant

## 2022-03-15 DIAGNOSIS — H60393 Other infective otitis externa, bilateral: Secondary | ICD-10-CM

## 2022-03-15 MED ORDER — AMOXICILLIN-POT CLAVULANATE 875-125 MG PO TABS: 1.0000 | ORAL_TABLET | Freq: Two times a day (BID) | ORAL | 0 refills | Status: DC

## 2022-07-20 ENCOUNTER — Encounter: Payer: Self-pay | Admitting: Gastroenterology

## 2022-08-05 ENCOUNTER — Ambulatory Visit (AMBULATORY_SURGERY_CENTER): Payer: Self-pay | Admitting: *Deleted

## 2022-08-05 VITALS — Ht 64.0 in | Wt 264.0 lb

## 2022-08-05 DIAGNOSIS — Z800 Family history of malignant neoplasm of digestive organs: Secondary | ICD-10-CM

## 2022-08-05 NOTE — Progress Notes (Signed)
No egg or soy allergy known to patient  No issues known to pt with past sedation with any surgeries or procedures Patient denies ever being told they had issues or difficulty with intubation  No FH of Malignant Hyperthermia Pt is not on diet pills Pt is not on  home 02  Pt is not on blood thinners  Pt denies issues with constipation  No A fib or A flutter Have any cardiac testing pending--no Pt instructed to use [email or web address] or GoodRx for a price reduction on prep    Pt. Already has suprep already at home.

## 2022-08-17 ENCOUNTER — Encounter: Payer: Self-pay | Admitting: Gastroenterology

## 2022-08-24 ENCOUNTER — Ambulatory Visit (INDEPENDENT_AMBULATORY_CARE_PROVIDER_SITE_OTHER): Payer: 59 | Admitting: Physician Assistant

## 2022-08-24 VITALS — BP 122/78

## 2022-08-24 DIAGNOSIS — E785 Hyperlipidemia, unspecified: Secondary | ICD-10-CM

## 2022-08-24 DIAGNOSIS — E559 Vitamin D deficiency, unspecified: Secondary | ICD-10-CM

## 2022-08-24 DIAGNOSIS — I10 Essential (primary) hypertension: Secondary | ICD-10-CM | POA: Diagnosis not present

## 2022-08-24 DIAGNOSIS — Z0000 Encounter for general adult medical examination without abnormal findings: Secondary | ICD-10-CM | POA: Diagnosis not present

## 2022-08-24 DIAGNOSIS — Z9109 Other allergy status, other than to drugs and biological substances: Secondary | ICD-10-CM

## 2022-08-24 DIAGNOSIS — J309 Allergic rhinitis, unspecified: Secondary | ICD-10-CM

## 2022-08-24 MED ORDER — HYDROCHLOROTHIAZIDE 25 MG PO TABS: 25.0000 mg | ORAL_TABLET | Freq: Every day | ORAL | 1 refills | Status: DC

## 2022-08-24 MED ORDER — MONTELUKAST SODIUM 10 MG PO TABS: 10.0000 mg | ORAL_TABLET | Freq: Every day | ORAL | 1 refills | Status: DC

## 2022-08-24 MED ORDER — OLMESARTAN MEDOXOMIL 40 MG PO TABS: 40.0000 mg | ORAL_TABLET | Freq: Every day | ORAL | 1 refills | Status: DC

## 2022-08-24 NOTE — Progress Notes (Signed)
Complete physical exam   Patient: Gina Farrell   DOB: 1971/10/12   51 y.o. Female  MRN: [id number] Visit Date: 08/24/2022   Chief Complaint  Patient presents with   Follow-up   Subjective    Gina Farrell is a 51 y.o. female who presents today for a complete physical exam.  She reports consuming a general diet. The patient does not participate in regular exercise at present. She generally feels fairly well. She does not have additional problems to discuss today.     Past Medical History:  Diagnosis Date   Allergic rhinitis    Allergy    Arthritis    touch knee   Chicken pox    GERD (gastroesophageal reflux disease)    Hypertension    Miscarriage 1998/2008   Vitamin D deficiency    past hx - controlled on vitamin D   Past Surgical History:  Procedure Laterality Date   CESAREAN SECTION     x 2   d and c     2008   TUBAL LIGATION     Social History   Socioeconomic History   Marital status: Married    Spouse name: Not on file   Number of children: Not on file   Years of education: Not on file   Highest education level: Not on file  Occupational History   Not on file  Tobacco Use   Smoking status: Former    Packs/day: 0.50    Years: 14.00    Total pack years: 7.00    Types: Cigarettes    Quit date: 11/28/2001    Years since quitting: 20.7    Passive exposure: Never   Smokeless tobacco: Never  Vaping Use   Vaping Use: Never used  Substance and Sexual Activity   Alcohol use: Not Currently   Drug use: No   Sexual activity: Not on file  Other Topics Concern   Not on file  Social History Narrative   Not on file   Social Determinants of Health   Financial Resource Strain: Not on file  Food Insecurity: Not on file  Transportation Needs: Not on file  Physical Activity: Not on file  Stress: Not on file  Social Connections: Not on file  Intimate Partner Violence: Not on file     Medications: Outpatient Medications Prior to Visit  Medication Sig    Cholecalciferol (VITAMIN D3) 5000 units TABS Take 1 tablet by mouth daily.   Cyanocobalamin (VITAMIN B 12 PO) Take by mouth.   Multiple Vitamins-Minerals (MULTIVITAMIN PO) Take by mouth daily.   Omega-3 Fatty Acids (FISH OIL) 1000 MG CAPS Take 100 mg by mouth daily.   [DISCONTINUED] hydrochlorothiazide (HYDRODIURIL) 25 MG tablet Take 1 tablet (25 mg total) by mouth daily.   [DISCONTINUED] montelukast (SINGULAIR) 10 MG tablet Take 1 tablet (10 mg total) by mouth at bedtime.   [DISCONTINUED] olmesartan (BENICAR) 40 MG tablet Take 1 tablet (40 mg total) by mouth daily.   ipratropium (ATROVENT) 0.06 % nasal spray  (Patient not taking: Reported on 03/11/2022)   Na Sulfate-K Sulfate-Mg Sulf 17.5-3.13-1.6 GM/177ML SOLN See admin instructions. (Patient not taking: Reported on 08/05/2022)   ondansetron (ZOFRAN) 4 MG tablet Take 1 tablet (4 mg total) by mouth as directed. (Patient not taking: Reported on 08/05/2022)   No facility-administered medications prior to visit.    Review of Systems Review of Systems:  A fourteen system review of systems was performed and found to be positive as per HPI.  Last CBC Lab Results  Component Value Date   WBC 7.5 06/03/2021   HGB 12.3 06/03/2021   HCT 39.5 06/03/2021   MCV 79 06/03/2021   MCH 24.5 (L) 06/03/2021   RDW 15.0 06/03/2021   PLT 257 11/16/7587   Last metabolic panel Lab Results  Component Value Date   GLUCOSE 97 02/17/2022   NA 137 02/17/2022   K 4.1 02/17/2022   CL 102 02/17/2022   CO2 24 02/17/2022   BUN 14 02/17/2022   CREATININE 0.71 02/17/2022   EGFR 104 02/17/2022   CALCIUM 9.6 02/17/2022   PHOS 3.2 12/20/2017   PROT 6.5 02/17/2022   ALBUMIN 4.0 02/17/2022   LABGLOB 2.5 02/17/2022   AGRATIO 1.6 02/17/2022   BILITOT 0.3 02/17/2022   ALKPHOS 68 02/17/2022   AST 12 02/17/2022   ALT 10 02/17/2022   ANIONGAP 4 (L) 05/14/2021   Last lipids Lab Results  Component Value Date   CHOL 180 02/17/2022   HDL 49 02/17/2022   LDLCALC 117  (H) 02/17/2022   TRIG 74 02/17/2022   CHOLHDL 3.7 02/17/2022   Last hemoglobin A1c Lab Results  Component Value Date   HGBA1C 5.6 06/03/2021   Last thyroid functions Lab Results  Component Value Date   TSH 1.200 06/03/2021   Last vitamin D Lab Results  Component Value Date   VD25OH 87.6 04/16/2020      Objective     LMP 07/14/2022 (Exact Date)  BP Readings from Last 3 Encounters:  03/11/22 115/80  02/17/22 127/82  08/20/21 126/83   Wt Readings from Last 3 Encounters:  08/05/22 264 lb (119.7 kg)  03/11/22 264 lb 1.6 oz (119.8 kg)  02/17/22 260 lb (117.9 kg)      Physical Exam   General Appearance:    Alert, cooperative, in no acute distress, appears stated age   Head:    Normocephalic, without obvious abnormality, atraumatic  Eyes:    PERRL, conjunctiva/corneas clear, EOM's intact, fundi    benign, both eyes  Ears:    Normal TM's and external ear canals, both ears  Nose:   Nares normal, septum midline, mucosa normal, no drainage    or sinus tenderness  Throat:   Lips, mucosa, and tongue normal; teeth and gums normal  Neck:   Supple, symmetrical, trachea midline, no adenopathy;    thyroid:  no enlargement/tenderness/nodules; no JVD  Back:     Symmetric, no curvature, ROM normal, no CVA tenderness  Lungs:     Clear to auscultation bilaterally, respirations unlabored  Chest Wall:    No tenderness or deformity   Heart:    Normal heart rate. Normal rhythm. No murmurs, rubs, or gallops.   Breast Exam:    deferred  Abdomen:     Soft, non-tender, bowel sounds active all four quadrants,    no masses, no organomegaly  Pelvic:    deferred  Extremities:   All extremities are intact. No cyanosis or edema  Pulses:   2+ and symmetric all extremities  Skin:   Skin color, texture, turgor normal, no rashes or lesions  Lymph nodes:   Cervical, supraclavicular nodes normal  Neurologic:   CNII-XII grossly intact.     Last depression screening scores    08/24/2022    8:36  AM 03/11/2022   10:46 AM 02/17/2022    9:36 AM  PHQ 2/9 Scores  PHQ - 2 Score 0 0 0  PHQ- 9 Score 0 0 0   Last fall risk screening  08/24/2022    8:35 AM  Fall Risk   Falls in the past year? 0  Number falls in past yr: 0  Injury with Fall? 0  Risk for fall due to : No Fall Risks  Follow up Falls evaluation completed     No results found for any visits on 08/24/22.  Assessment & Plan    Routine Health Maintenance and Physical Exam  Exercise Activities and Dietary recommendations -Discussed heart healthy diet low in fat and carbohydrates. Recommend moderate exercise 150 mins/wk.  Immunization History  Administered Date(s) Administered   Influenza-Unspecified 09/01/2014   PFIZER(Purple Top)SARS-COV-2 Vaccination 02/11/2020, 03/03/2020, 09/17/2020   Td 11/28/2001   Tdap 07/30/2011    Health Maintenance  Topic Date Due   Hepatitis C Screening  Never done   COLONOSCOPY (Pts 45-81yr Insurance coverage will need to be confirmed)  Never done   COVID-19 Vaccine (4 - Pfizer series) 11/12/2020   TETANUS/TDAP  07/29/2021   Zoster Vaccines- Shingrix (1 of 2) Never done   INFLUENZA VACCINE  06/28/2022   MAMMOGRAM  11/02/2022   PAP SMEAR-Modifier  11/04/2023   HIV Screening  Completed   HPV VACCINES  Aged Out    Discussed health benefits of physical activity, and encouraged her to engage in regular exercise appropriate for her age and condition.  Problem List Items Addressed This Visit       Cardiovascular and Mediastinum   Benign essential hypertension (Chronic)   Relevant Medications   olmesartan (BENICAR) 40 MG tablet   hydrochlorothiazide (HYDRODIURIL) 25 MG tablet   Other Relevant Orders   CBC w/Diff   Comp Met (CMET)   Lipid Profile   HgB A1c     Respiratory   Allergic rhinitis   Relevant Medications   montelukast (SINGULAIR) 10 MG tablet     Other   Environmental allergies (Chronic)   Relevant Medications   montelukast (SINGULAIR) 10 MG tablet    Hyperlipidemia (Chronic)   Relevant Medications   olmesartan (BENICAR) 40 MG tablet   hydrochlorothiazide (HYDRODIURIL) 25 MG tablet   Other Relevant Orders   CBC w/Diff   Comp Met (CMET)   Lipid Profile   HgB A1c   Other Visit Diagnoses     Healthcare maintenance    -  Primary   Relevant Orders   CBC w/Diff   Comp Met (CMET)   Lipid Profile   HgB A1c   Vitamin D deficiency       Relevant Orders   Vitamin D (25 hydroxy)      Will obtain routine fasting labs. Will request dexa scan for [facility]. Scheduled for colonoscopy next week. Deferred immunizations. Provided medication refills. Encourage to resume weight loss efforts with diet and lifestyle interventions.   Return in about 6 months (around 02/22/2023) for HLD, HTN, allergies .       MLorrene Reid PA-C  [phone](phone) [phone](fax)  [place]

## 2022-08-24 NOTE — Patient Instructions (Signed)
Preventive Care 40-50 Years Old, Female Preventive care refers to lifestyle choices and visits with your health care provider that can promote health and wellness. Preventive care visits are also called wellness exams. What can I expect for my preventive care visit? Counseling Your health care provider may ask you questions about your: Medical history, including: Past medical problems. Family medical history. Pregnancy history. Current health, including: Menstrual cycle. Method of birth control. Emotional well-being. Home life and relationship well-being. Sexual activity and sexual health. Lifestyle, including: Alcohol, nicotine or tobacco, and drug use. Access to firearms. Diet, exercise, and sleep habits. Work and work environment. Sunscreen use. Safety issues such as seatbelt and bike helmet use. Physical exam Your health care provider will check your: Height and weight. These may be used to calculate your BMI (body mass index). BMI is a measurement that tells if you are at a healthy weight. Waist circumference. This measures the distance around your waistline. This measurement also tells if you are at a healthy weight and may help predict your risk of certain diseases, such as type 2 diabetes and high blood pressure. Heart rate and blood pressure. Body temperature. Skin for abnormal spots. What immunizations do I need?  Vaccines are usually given at various ages, according to a schedule. Your health care provider will recommend vaccines for you based on your age, medical history, and lifestyle or other factors, such as travel or where you work. What tests do I need? Screening Your health care provider may recommend screening tests for certain conditions. This may include: Lipid and cholesterol levels. Diabetes screening. This is done by checking your blood sugar (glucose) after you have not eaten for a while (fasting). Pelvic exam and Pap test. Hepatitis B test. Hepatitis C  test. HIV (human immunodeficiency virus) test. STI (sexually transmitted infection) testing, if you are at risk. Lung cancer screening. Colorectal cancer screening. Mammogram. Talk with your health care provider about when you should start having regular mammograms. This may depend on whether you have a family history of breast cancer. BRCA-related cancer screening. This may be done if you have a family history of breast, ovarian, tubal, or peritoneal cancers. Bone density scan. This is done to screen for osteoporosis. Talk with your health care provider about your test results, treatment options, and if necessary, the need for more tests. Follow these instructions at home: Eating and drinking  Eat a diet that includes fresh fruits and vegetables, whole grains, lean protein, and low-fat dairy products. Take vitamin and mineral supplements as recommended by your health care provider. Do not drink alcohol if: Your health care provider tells you not to drink. You are pregnant, may be pregnant, or are planning to become pregnant. If you drink alcohol: Limit how much you have to 0-1 drink a day. Know how much alcohol is in your drink. In the [place] one drink equals one 12 oz bottle of beer (355 mL), one 5 oz glass of wine (148 mL), or one 1 oz glass of hard liquor (44 mL). Lifestyle Brush your teeth every morning and night with fluoride toothpaste. Floss one time each day. Exercise for at least 30 minutes 5 or more days each week. Do not use any products that contain nicotine or tobacco. These products include cigarettes, chewing tobacco, and vaping devices, such as e-cigarettes. If you need help quitting, ask your health care provider. Do not use drugs. If you are sexually active, practice safe sex. Use a condom or other form of protection to   prevent STIs. If you do not wish to become pregnant, use a form of birth control. If you plan to become pregnant, see your health care provider for a  prepregnancy visit. Take aspirin only as told by your health care provider. Make sure that you understand how much to take and what form to take. Work with your health care provider to find out whether it is safe and beneficial for you to take aspirin daily. Find healthy ways to manage stress, such as: Meditation, yoga, or listening to music. Journaling. Talking to a trusted person. Spending time with friends and family. Minimize exposure to UV radiation to reduce your risk of skin cancer. Safety Always wear your seat belt while driving or riding in a vehicle. Do not drive: If you have been drinking alcohol. Do not ride with someone who has been drinking. When you are tired or distracted. While texting. If you have been using any mind-altering substances or drugs. Wear a helmet and other protective equipment during sports activities. If you have firearms in your house, make sure you follow all gun safety procedures. Seek help if you have been physically or sexually abused. What's next? Visit your health care provider once a year for an annual wellness visit. Ask your health care provider how often you should have your eyes and teeth checked. Stay up to date on all vaccines. This information is not intended to replace advice given to you by your health care provider. Make sure you discuss any questions you have with your health care provider. Document Revised: 05/12/2021 Document Reviewed: 05/12/2021 Elsevier Patient Education  [place].

## 2022-08-26 ENCOUNTER — Other Ambulatory Visit: Payer: 59

## 2022-08-27 LAB — LIPID PANEL
Chol/HDL Ratio: 3.4 ratio (ref 0.0–4.4)
Cholesterol, Total: 169 mg/dL (ref 100–199)
HDL: 49 mg/dL (ref >39)
LDL Chol Calc (NIH): 102 mg/dL — ABNORMAL HIGH (ref 0–99)
Triglycerides: 98 mg/dL (ref 0–149)
VLDL Cholesterol Cal: 18 mg/dL (ref 5–40)

## 2022-08-27 LAB — COMPREHENSIVE METABOLIC PANEL
ALT: 14 IU/L (ref 0–32)
AST: 12 IU/L (ref 0–40)
Albumin/Globulin Ratio: 1.4 (ref 1.2–2.2)
Albumin: 3.9 g/dL (ref 3.9–4.9)
Alkaline Phosphatase: 71 IU/L (ref 44–121)
BUN/Creatinine Ratio: 18 (ref 9–23)
BUN: 13 mg/dL (ref 6–24)
Bilirubin Total: <0.2 mg/dL (ref 0.0–1.2)
CO2: 23 mmol/L (ref 20–29)
Calcium: 9.5 mg/dL (ref 8.7–10.2)
Chloride: 101 mmol/L (ref 96–106)
Creatinine, Ser: 0.71 mg/dL (ref 0.57–1.00)
Globulin, Total: 2.8 g/dL (ref 1.5–4.5)
Glucose: 84 mg/dL (ref 70–99)
Potassium: 4 mmol/L (ref 3.5–5.2)
Sodium: 136 mmol/L (ref 134–144)
Total Protein: 6.7 g/dL (ref 6.0–8.5)
eGFR: 104 mL/min/{1.73_m2} (ref >59)

## 2022-08-27 LAB — CBC WITH DIFFERENTIAL/PLATELET
Basophils Absolute: 0.1 10*3/uL (ref 0.0–0.2)
Basos: 1 %
EOS (ABSOLUTE): 0.1 10*3/uL (ref 0.0–0.4)
Eos: 2 %
Hematocrit: 37.9 % (ref 34.0–46.6)
Hemoglobin: 12.2 g/dL (ref 11.1–15.9)
Immature Grans (Abs): 0.1 10*3/uL (ref 0.0–0.1)
Immature Granulocytes: 1 %
Lymphocytes Absolute: 1.8 10*3/uL (ref 0.7–3.1)
Lymphs: 22 %
MCH: 25.7 pg — ABNORMAL LOW (ref 26.6–33.0)
MCHC: 32.2 g/dL (ref 31.5–35.7)
MCV: 80 fL (ref 79–97)
Monocytes Absolute: 0.7 10*3/uL (ref 0.1–0.9)
Monocytes: 9 %
Neutrophils Absolute: 5.2 10*3/uL (ref 1.4–7.0)
Neutrophils: 65 %
Platelets: 272 10*3/uL (ref 150–450)
RBC: 4.74 x10E6/uL (ref 3.77–5.28)
RDW: 14.9 % (ref 11.7–15.4)
WBC: 7.9 10*3/uL (ref 3.4–10.8)

## 2022-08-27 LAB — HEMOGLOBIN A1C
Est. average glucose Bld gHb Est-mCnc: 114 mg/dL
Hgb A1c MFr Bld: 5.6 % (ref 4.8–5.6)

## 2022-08-27 LAB — VITAMIN D 25 HYDROXY (VIT D DEFICIENCY, FRACTURES): Vit D, 25-Hydroxy: 65.9 ng/mL (ref 30.0–100.0)

## 2022-08-29 ENCOUNTER — Telehealth: Payer: Self-pay | Admitting: Gastroenterology

## 2022-08-29 ENCOUNTER — Telehealth: Payer: Self-pay

## 2022-08-29 NOTE — Telephone Encounter (Signed)
Recall placed for December

## 2022-08-29 NOTE — Telephone Encounter (Addendum)
Spoke to patient about headaches to recommend trying Excedrin migraine or Excedrin tension headache. Patient stated that she has tried it already and the only thing that helps is taking 4 ibuprofen every 6 hours. Patient stated that she thinks it's not headache related but a possible pinch nerve or compressed nerve in neck or lower skull and wants a referral to neuro. Patient has an appointment scheduled with Heather on 08/31/22.

## 2022-08-29 NOTE — Telephone Encounter (Signed)
Good Morning Dr. Rush Landmark,  Patient called stating that she needed to cancel her colonoscopy for tomorrow at 9:30 due to having a pinched nerve and being on  flexeril and ibuprofen.   Patient stated she will call back at a later time to reschedule.

## 2022-08-29 NOTE — Telephone Encounter (Signed)
Patient called office stated that she has had a headache for 3 days, and wanted to see what her suggestions should be, patient is scheduled for 10/4, please advise, thanks!

## 2022-08-29 NOTE — Telephone Encounter (Signed)
Sorry to hear this. Place recall for 2 months from now in case she has not called back. Thanks. GM

## 2022-08-30 ENCOUNTER — Encounter: Payer: 59 | Admitting: Gastroenterology

## 2022-08-30 NOTE — Progress Notes (Unsigned)
Established patient visit   Patient: Gina Farrell   DOB: 1971/07/10   51 y.o. Female  MRN: [id number] Visit Date: 08/31/2022   No chief complaint on file.  Subjective    HPI  Follow up visit  -c/o pain/pinched nervie in neck and/or shoulder  -causing headache    Medications: Outpatient Medications Prior to Visit  Medication Sig   Cholecalciferol (VITAMIN D3) 5000 units TABS Take 1 tablet by mouth daily.   Cyanocobalamin (VITAMIN B 12 PO) Take by mouth.   hydrochlorothiazide (HYDRODIURIL) 25 MG tablet Take 1 tablet (25 mg total) by mouth daily.   ipratropium (ATROVENT) 0.06 % nasal spray  (Patient not taking: Reported on 03/11/2022)   montelukast (SINGULAIR) 10 MG tablet Take 1 tablet (10 mg total) by mouth at bedtime.   Multiple Vitamins-Minerals (MULTIVITAMIN PO) Take by mouth daily.   Na Sulfate-K Sulfate-Mg Sulf 17.5-3.13-1.6 GM/177ML SOLN See admin instructions. (Patient not taking: Reported on 08/05/2022)   olmesartan (BENICAR) 40 MG tablet Take 1 tablet (40 mg total) by mouth daily.   Omega-3 Fatty Acids (FISH OIL) 1000 MG CAPS Take 100 mg by mouth daily.   ondansetron (ZOFRAN) 4 MG tablet Take 1 tablet (4 mg total) by mouth as directed. (Patient not taking: Reported on 08/05/2022)   No facility-administered medications prior to visit.    Review of Systems  {Labs (Optional):23779}   Objective    Last Menstrual Period 07/14/2022 (Exact Date)  BP Readings from Last 3 Encounters:  08/24/22 122/78  03/11/22 115/80  02/17/22 127/82    Wt Readings from Last 3 Encounters:  08/05/22 264 lb (119.7 kg)  03/11/22 264 lb 1.6 oz (119.8 kg)  02/17/22 260 lb (117.9 kg)    Physical Exam  ***  No results found for any visits on 08/31/22.  Assessment & Plan     Problem List Items Addressed This Visit   None    No follow-ups on file.         Ronnell Freshwater, NP  [facility] at [facility] [phone] (phone) [phone] (fax)  [place]

## 2022-08-31 ENCOUNTER — Ambulatory Visit: Payer: 59 | Admitting: Nurse Practitioner

## 2022-08-31 ENCOUNTER — Encounter: Payer: Self-pay | Admitting: Nurse Practitioner

## 2022-08-31 VITALS — BP 117/83 | HR 94 | Ht 64.0 in | Wt 260.4 lb

## 2022-08-31 DIAGNOSIS — M791 Myalgia, unspecified site: Secondary | ICD-10-CM | POA: Diagnosis not present

## 2022-08-31 DIAGNOSIS — J300 Vasomotor rhinitis: Secondary | ICD-10-CM | POA: Insufficient documentation

## 2022-08-31 DIAGNOSIS — M542 Cervicalgia: Secondary | ICD-10-CM | POA: Diagnosis not present

## 2022-08-31 MED ORDER — CYCLOBENZAPRINE HCL 10 MG PO TABS: ORAL_TABLET | ORAL | 1 refills | Status: DC

## 2022-08-31 MED ORDER — PREDNISONE 10 MG (21) PO TBPK: ORAL_TABLET | ORAL | 0 refills | Status: DC

## 2022-08-31 MED ORDER — FLUTICASONE PROPIONATE 50 MCG/ACT NA SUSP: 2.0000 | Freq: Every day | NASAL | 6 refills | Status: DC

## 2022-09-02 ENCOUNTER — Ambulatory Visit
Admission: RE | Admit: 2022-09-02 | Discharge: 2022-09-02 | Disposition: A | Discharge status: Home / Self Care | Payer: 59 | Source: Ambulatory Visit | Attending: Nurse Practitioner | Admitting: Nurse Practitioner

## 2022-09-02 DIAGNOSIS — M791 Myalgia, unspecified site: Secondary | ICD-10-CM

## 2022-09-02 DIAGNOSIS — M542 Cervicalgia: Secondary | ICD-10-CM

## 2022-09-12 ENCOUNTER — Encounter: Payer: Self-pay | Admitting: Nurse Practitioner

## 2022-09-27 ENCOUNTER — Telehealth: Payer: Self-pay

## 2022-09-27 NOTE — Telephone Encounter (Signed)
Na

## 2022-09-29 ENCOUNTER — Telehealth: Payer: Self-pay

## 2022-09-29 NOTE — Telephone Encounter (Signed)
Error did not sent out letter Lonny Eisen RMA

## 2022-10-17 ENCOUNTER — Telehealth: Payer: 59 | Admitting: Nurse Practitioner

## 2022-10-17 DIAGNOSIS — J0140 Acute pansinusitis, unspecified: Secondary | ICD-10-CM

## 2022-10-17 MED ORDER — AMOXICILLIN-POT CLAVULANATE 875-125 MG PO TABS: 1.0000 | ORAL_TABLET | Freq: Two times a day (BID) | ORAL | 0 refills | Status: AC

## 2022-10-17 MED ORDER — PREDNISONE 10 MG (21) PO TBPK: ORAL_TABLET | ORAL | 0 refills | Status: DC

## 2022-10-17 NOTE — Progress Notes (Signed)
E-Visit for Sinus Problems  We are sorry that you are not feeling well.  Here is how we plan to help!  Based on what you have shared with me it looks like you have sinusitis.  Sinusitis is inflammation and infection in the sinus cavities of the head.  Based on your presentation I believe you most likely have Acute Bacterial Sinusitis.  This is an infection caused by bacteria and is treated with antibiotics. I have prescribed Augmentin '875mg'$ /'125mg'$  one tablet twice daily with food, for 7 days. You may use an oral decongestant such as Mucinex D or if you have glaucoma or high blood pressure use plain Mucinex. Saline nasal spray help and can safely be used as often as needed for congestion.  If you develop worsening sinus pain, fever or notice severe headache and vision changes, or if symptoms are not better after completion of antibiotic, please schedule an appointment with a health care provider.    If you are not having improvement after 24 hours on the antibiotic you can start a prednisone taper as well  Be sure you take both the antibiotic and prednisone with food.   Meds ordered this encounter  Medications   amoxicillin-clavulanate (AUGMENTIN) 875-125 MG tablet    Sig: Take 1 tablet by mouth 2 (two) times daily for 7 days. Take with food    Dispense:  14 tablet    Refill:  0   predniSONE (STERAPRED UNI-PAK 21 TAB) 10 MG (21) TBPK tablet    Sig: Take 6 tablets on day one, 5 on day two, 4 on day three, 3 on day four, 2 on day five, and 1 on day six. Take with food.    Dispense:  21 tablet    Refill:  0     Sinus infections are not as easily transmitted as other respiratory infection, however we still recommend that you avoid close contact with loved ones, especially the very young and elderly.  Remember to wash your hands thoroughly throughout the day as this is the number one way to prevent the spread of infection!  Home Care: Only take medications as instructed by your medical  team. Complete the entire course of an antibiotic. Do not take these medications with alcohol. A steam or ultrasonic humidifier can help congestion.  You can place a towel over your head and breathe in the steam from hot water coming from a faucet. Avoid close contacts especially the very young and the elderly. Cover your mouth when you cough or sneeze. Always remember to wash your hands.  Get Help Right Away If: You develop worsening fever or sinus pain. You develop a severe head ache or visual changes. Your symptoms persist after you have completed your treatment plan.  Make sure you Understand these instructions. Will watch your condition. Will get help right away if you are not doing well or get worse.  Thank you for choosing an e-visit.  Your e-visit answers were reviewed by a board certified advanced clinical practitioner to complete your personal care plan. Depending upon the condition, your plan could have included both over the counter or prescription medications.  Please review your pharmacy choice. Make sure the pharmacy is open so you can pick up prescription now. If there is a problem, you may contact your provider through [employer] and have the prescription routed to another pharmacy.  Your safety is important to [place]. If you have drug allergies check your prescription carefully.   For the next 24  hours you can use MyChart to ask questions about today's visit, request a non-urgent call back, or ask for a work or school excuse. You will get an email in the next two days asking about your experience. I hope that your e-visit has been valuable and will speed your recovery.   I spent approximately 5 minutes reviewing the patient's history, current symptoms and coordinating their plan of care today.

## 2022-10-27 ENCOUNTER — Encounter: Payer: Self-pay | Admitting: Gastroenterology

## 2022-11-25 ENCOUNTER — Telehealth: Payer: Self-pay | Admitting: *Deleted

## 2022-11-25 ENCOUNTER — Ambulatory Visit (AMBULATORY_SURGERY_CENTER): Payer: 59 | Admitting: *Deleted

## 2022-11-25 VITALS — Ht 64.0 in | Wt 259.0 lb

## 2022-11-25 DIAGNOSIS — Z800 Family history of malignant neoplasm of digestive organs: Secondary | ICD-10-CM

## 2022-11-25 NOTE — Progress Notes (Signed)
No egg or soy allergy known to patient  No issues known to pt with past sedation with any surgeries or procedures Patient denies ever being told they had issues or difficulty with intubation  No FH of Malignant Hyperthermia Pt is not on diet pills Pt is not on  home 02  Pt is not on blood thinners  Pt denies issues with constipation  Pt is not on dialysis Pt denies any upcoming cardiac testing Pt encouraged to use to use Singlecare or Goodrx to reduce cost  Patient's chart reviewed by Osvaldo Angst CNRA prior to previsit and patient appropriate for the [facility].  Previsit completed and red dot placed by patient's name on their procedure day (on provider's schedule).  . Pre-visit done by phone Instructions sent by mail and my chart Pt already has prep at home

## 2022-11-25 NOTE — Telephone Encounter (Signed)
1st attempt to call no answer. LM

## 2022-12-20 ENCOUNTER — Encounter: Payer: Self-pay | Admitting: Gastroenterology

## 2022-12-21 ENCOUNTER — Encounter: Payer: Self-pay | Admitting: Certified Registered Nurse Anesthetist

## 2022-12-27 ENCOUNTER — Ambulatory Visit (AMBULATORY_SURGERY_CENTER): Payer: 59 | Admitting: Gastroenterology

## 2022-12-27 ENCOUNTER — Encounter: Payer: Self-pay | Admitting: Gastroenterology

## 2022-12-27 VITALS — BP 115/74 | HR 68 | Temp 98.8°F | Resp 18 | Ht 64.0 in | Wt 259.0 lb

## 2022-12-27 DIAGNOSIS — Z1211 Encounter for screening for malignant neoplasm of colon: Secondary | ICD-10-CM | POA: Diagnosis present

## 2022-12-27 DIAGNOSIS — Z800 Family history of malignant neoplasm of digestive organs: Secondary | ICD-10-CM

## 2022-12-27 MED ORDER — SODIUM CHLORIDE 0.9 % IV SOLN: 500.0000 mL | Freq: Once | INTRAVENOUS | Status: DC

## 2022-12-27 NOTE — Op Note (Signed)
[place] Patient Name: Gina Farrell Procedure Date: 12/27/2022 9:00 AM MRN: [id number] Endoscopist: Justice Britain , MD, [id number] Age: 52 Referring MD:  Date of Birth: 1971-03-31 Gender: Female Account #: [id number] Procedure:                Colonoscopy Indications:              Screening in patient at increased risk: Colorectal                            cancer in father 29 or older Medicines:                Monitored Anesthesia Care Procedure:                Pre-Anesthesia Assessment:                           - Prior to the procedure, a History and Physical                            was performed, and patient medications and                            allergies were reviewed. The patient's tolerance of                            previous anesthesia was also reviewed. The risks                            and benefits of the procedure and the sedation                            options and risks were discussed with the patient.                            All questions were answered, and informed consent                            was obtained. Prior Anticoagulants: The patient has                            taken no anticoagulant or antiplatelet agents. ASA                            Grade Assessment: III - A patient with severe                            systemic disease. After reviewing the risks and                            benefits, the patient was deemed in satisfactory                            condition to undergo the procedure.  After obtaining informed consent, the colonoscope                            was passed under direct vision. Throughout the                            procedure, the patient's blood pressure, pulse, and                            oxygen saturations were monitored continuously. The                            Colonoscope was introduced through the anus and                            advanced to the 3 cm  into the ileum. The                            colonoscopy was performed without difficulty. The                            patient tolerated the procedure. The quality of the                            bowel preparation was adequate. The terminal ileum,                            ileocecal valve, appendiceal orifice, and rectum                            were photographed. Scope In: 9:17:28 AM Scope Out: 9:29:36 AM Scope Withdrawal Time: 0 hours 7 minutes 54 seconds  Total Procedure Duration: 0 hours 12 minutes 8 seconds  Findings:                 Skin tags were found on perianal exam.                           The digital rectal exam findings include                            hemorrhoids. Pertinent negatives include no                            palpable rectal lesions.                           The terminal ileum and ileocecal valve appeared                            normal.                           Multiple small-mouthed diverticula were found in  the recto-sigmoid colon and sigmoid colon.                           Normal mucosa was found in the entire colon.                           Non-bleeding non-thrombosed external and internal                            hemorrhoids were found during retroflexion, during                            perianal exam and during digital exam. The                            hemorrhoids were Grade II (internal hemorrhoids                            that prolapse but reduce spontaneously). Complications:            No immediate complications. Estimated Blood Loss:     Estimated blood loss: none. Impression:               - Perianal skin tags found on perianal exam.                            Hemorrhoids found on digital rectal exam.                           - The examined portion of the ileum was normal.                           - Diverticulosis in the recto-sigmoid colon and in                            the sigmoid  colon.                           - Normal mucosa in the entire examined colon.                           - Non-bleeding non-thrombosed external and internal                            hemorrhoids. Recommendation:           - The patient will be observed post-procedure,                            until all discharge criteria are met.                           - Discharge patient to home.                           - Patient has a contact number available for  emergencies. The signs and symptoms of potential                            delayed complications were discussed with the                            patient. Return to normal activities tomorrow.                            Written discharge instructions were provided to the                            patient.                           - High fiber diet.                           - Use FiberCon 1-2 tablets PO daily.                           - Continue present medications.                           - Repeat colonoscopy in 5 years for screening                            purposes due to family history colon cancer.                           - The findings and recommendations were discussed                            with the patient.                           - The findings and recommendations were discussed                            with the patient's family. Justice Britain, MD 12/27/2022 9:35:03 AM

## 2022-12-27 NOTE — Progress Notes (Signed)
Report given to PACU, vss 

## 2022-12-27 NOTE — Patient Instructions (Signed)
HANDOUTS PROVIDED ON: HIGH FIBER DIET, HEMORRHOIDS, & DIVERTICULOSIS  Your next colonoscopy should occur in 5 years.    You may resume your previous diet and medication schedule.  Begin taking an OTC fiber supplement daily such as FiberCon.  Thank you for allowing [place] to care for you today!!!   YOU HAD AN ENDOSCOPIC PROCEDURE TODAY AT [facility]:   Refer to the procedure report that was given to you for any specific questions about what was found during the examination.  If the procedure report does not answer your questions, please call your gastroenterologist to clarify.  If you requested that your care partner not be given the details of your procedure findings, then the procedure report has been included in a sealed envelope for you to review at your convenience later.  YOU SHOULD EXPECT: Some feelings of bloating in the abdomen. Passage of more gas than usual.  Walking can help get rid of the air that was put into your GI tract during the procedure and reduce the bloating. If you had a lower endoscopy (such as a colonoscopy or flexible sigmoidoscopy) you may notice spotting of blood in your stool or on the toilet paper. If you underwent a bowel prep for your procedure, you may not have a normal bowel movement for a few days.  Please Note:  You might notice some irritation and congestion in your nose or some drainage.  This is from the oxygen used during your procedure.  There is no need for concern and it should clear up in a day or so.  SYMPTOMS TO REPORT IMMEDIATELY:  Following lower endoscopy (colonoscopy or flexible sigmoidoscopy):  Excessive amounts of blood in the stool  Significant tenderness or worsening of abdominal pains  Swelling of the abdomen that is new, acute  Fever of 100F or higher  For urgent or emergent issues, a gastroenterologist can be reached at any hour by calling [phone]. Do not use MyChart messaging for urgent concerns.    DIET:  We  do recommend a small meal at first, but then you may proceed to your regular diet.  Drink plenty of fluids but you should avoid alcoholic beverages for 24 hours.  ACTIVITY:  You should plan to take it easy for the rest of today and you should NOT DRIVE or use heavy machinery until tomorrow (because of the sedation medicines used during the test).    FOLLOW UP: Our staff will call the number listed on your records the next business day following your procedure.  We will call around 7:15- 8:00 am to check on you and address any questions or concerns that you may have regarding the information given to you following your procedure. If we do not reach you, we will leave a message.     If any biopsies were taken you will be contacted by phone or by letter within the next 1-3 weeks.  Please call us at [phone] if you have not heard about the biopsies in 3 weeks.    SIGNATURES/CONFIDENTIALITY: You and/or your care partner have signed paperwork which will be entered into your electronic medical record.  These signatures attest to the fact that that the information above on your After Visit Summary has been reviewed and is understood.  Full responsibility of the confidentiality of this discharge information lies with you and/or your care-partner.

## 2022-12-27 NOTE — Progress Notes (Signed)
Pt's states no medical or surgical changes since previsit or office visit. 

## 2022-12-27 NOTE — Progress Notes (Signed)
GASTROENTEROLOGY PROCEDURE H&P NOTE   Primary Care Physician: Lorrene Reid, PA-C  HPI: Gina Farrell is a 52 y.o. female who presents for Colonoscopy for colon cancer screening (Father with hx Colon Cancer).  Past Medical History:  Diagnosis Date   Allergic rhinitis    Allergy    Arthritis    touch knee   Chicken pox    GERD (gastroesophageal reflux disease)    Hypertension    Miscarriage 1998/2008   Vitamin D deficiency    past hx - controlled on vitamin D   Past Surgical History:  Procedure Laterality Date   CESAREAN SECTION     x 2   d and c     2008   TUBAL LIGATION     Current Outpatient Medications  Medication Sig Dispense Refill   Cholecalciferol (VITAMIN D3) 5000 units TABS Take 1 tablet by mouth daily.     Cyanocobalamin (VITAMIN B 12 PO) Take by mouth.     hydrochlorothiazide (HYDRODIURIL) 25 MG tablet Take 1 tablet (25 mg total) by mouth daily. 90 tablet 1   montelukast (SINGULAIR) 10 MG tablet Take 1 tablet (10 mg total) by mouth at bedtime. 90 tablet 1   Multiple Vitamins-Minerals (MULTIVITAMIN PO) Take by mouth daily.     olmesartan (BENICAR) 40 MG tablet Take 1 tablet (40 mg total) by mouth daily. 90 tablet 1   Omega-3 Fatty Acids (FISH OIL) 1000 MG CAPS Take 100 mg by mouth daily.     cyclobenzaprine (FLEXERIL) 10 MG tablet Take 1/2 to 1 tablet at bedtime as needed for muscle pain 30 tablet 1   fluticasone (FLONASE) 50 MCG/ACT nasal spray Place 2 sprays into both nostrils daily. 16 g 6   ipratropium (ATROVENT) 0.06 % nasal spray  (Patient not taking: Reported on 11/25/2022)     Current Facility-Administered Medications  Medication Dose Route Frequency Provider Last Rate Last Admin   0.9 %  sodium chloride infusion  500 mL Intravenous Once Mansouraty, Telford Nab., MD        Current Outpatient Medications:    Cholecalciferol (VITAMIN D3) 5000 units TABS, Take 1 tablet by mouth daily., Disp: , Rfl:    Cyanocobalamin (VITAMIN B 12 PO), Take by  mouth., Disp: , Rfl:    hydrochlorothiazide (HYDRODIURIL) 25 MG tablet, Take 1 tablet (25 mg total) by mouth daily., Disp: 90 tablet, Rfl: 1   montelukast (SINGULAIR) 10 MG tablet, Take 1 tablet (10 mg total) by mouth at bedtime., Disp: 90 tablet, Rfl: 1   Multiple Vitamins-Minerals (MULTIVITAMIN PO), Take by mouth daily., Disp: , Rfl:    olmesartan (BENICAR) 40 MG tablet, Take 1 tablet (40 mg total) by mouth daily., Disp: 90 tablet, Rfl: 1   Omega-3 Fatty Acids (FISH OIL) 1000 MG CAPS, Take 100 mg by mouth daily., Disp: , Rfl:    cyclobenzaprine (FLEXERIL) 10 MG tablet, Take 1/2 to 1 tablet at bedtime as needed for muscle pain, Disp: 30 tablet, Rfl: 1   fluticasone (FLONASE) 50 MCG/ACT nasal spray, Place 2 sprays into both nostrils daily., Disp: 16 g, Rfl: 6   ipratropium (ATROVENT) 0.06 % nasal spray, , Disp: , Rfl:   Current Facility-Administered Medications:    0.9 %  sodium chloride infusion, 500 mL, Intravenous, Once, Mansouraty, Telford Nab., MD Allergies  Allergen Reactions   Red Yeast Rice [Cholestin]     Cramps/Heartburn   Family History  Problem Relation Age of Onset   Hypertension Mother    Stroke Mother  Atrial fibrillation Mother    Diabetes Mother    Colon cancer Father        recent dx at age 14   Ulcerative colitis Father    Rheum arthritis Maternal Grandmother    Coronary artery disease Maternal Grandmother    Hypertension Maternal Grandmother    Cancer Neg Hx    Colon polyps Neg Hx    Esophageal cancer Neg Hx    Stomach cancer Neg Hx    Rectal cancer Neg Hx    Crohn's disease Neg Hx    Social History   Socioeconomic History   Marital status: Married    Spouse name: Not on file   Number of children: Not on file   Years of education: Not on file   Highest education level: Not on file  Occupational History   Not on file  Tobacco Use   Smoking status: Former    Packs/day: 0.50    Years: 14.00    Total pack years: 7.00    Types: Cigarettes    Quit  date: 11/28/2001    Years since quitting: 21.0    Passive exposure: Never   Smokeless tobacco: Never  Vaping Use   Vaping Use: Never used  Substance and Sexual Activity   Alcohol use: Not Currently   Drug use: No   Sexual activity: Not on file  Other Topics Concern   Not on file  Social History Narrative   Not on file   Social Determinants of Health   Financial Resource Strain: Not on file  Food Insecurity: Not on file  Transportation Needs: Not on file  Physical Activity: Not on file  Stress: Not on file  Social Connections: Not on file  Intimate Partner Violence: Not on file    Physical Exam: Today's Vitals   12/27/22 0852 12/27/22 0853  BP: 132/79   Pulse: 93   Temp: 98.9 F (37.2 C) 98.8 F (37.1 C)  TempSrc: Skin   SpO2: 97%   Weight: 259 lb (117.5 kg)   Height: '5\' 4"'$  (1.626 m)    Body mass index is 44.46 kg/m. GEN: NAD EYE: Sclerae anicteric ENT: MMM CV: Non-tachycardic GI: Soft, NT/ND NEURO:  Alert & Oriented x 3  Lab Results: No results for input(s): "WBC", "HGB", "HCT", "PLT" in the last 72 hours. BMET No results for input(s): "NA", "K", "CL", "CO2", "GLUCOSE", "BUN", "CREATININE", "CALCIUM" in the last 72 hours. LFT No results for input(s): "PROT", "ALBUMIN", "AST", "ALT", "ALKPHOS", "BILITOT", "BILIDIR", "IBILI" in the last 72 hours. PT/INR No results for input(s): "LABPROT", "INR" in the last 72 hours.   Impression / Plan: This is a 52 y.o.female who presents for Colonoscopy for colon cancer screening (Father with hx Colon Cancer).  The risks and benefits of endoscopic evaluation/treatment were discussed with the patient and/or family; these include but are not limited to the risk of perforation, infection, bleeding, missed lesions, lack of diagnosis, severe illness requiring hospitalization, as well as anesthesia and sedation related illnesses.  The patient's history has been reviewed, patient examined, no change in status, and deemed stable for  procedure.  The patient and/or family is agreeable to proceed.    Justice Britain, MD [facility] Advanced [facility]oscopy Office # [phone]

## 2022-12-28 ENCOUNTER — Telehealth: Payer: Self-pay

## 2022-12-28 NOTE — Telephone Encounter (Signed)
Post procedure follow up call, no answer 

## 2023-01-25 LAB — HM PAP SMEAR
HM Pap smear: ABNORMAL
HPV, high-risk: NEGATIVE

## 2023-01-25 LAB — HM MAMMOGRAPHY

## 2023-03-27 ENCOUNTER — Encounter: Payer: Self-pay | Admitting: Nurse Practitioner

## 2023-03-27 ENCOUNTER — Other Ambulatory Visit: Payer: Self-pay | Admitting: Nurse Practitioner

## 2023-03-27 DIAGNOSIS — Z9109 Other allergy status, other than to drugs and biological substances: Secondary | ICD-10-CM

## 2023-03-27 DIAGNOSIS — I10 Essential (primary) hypertension: Secondary | ICD-10-CM

## 2023-03-27 DIAGNOSIS — J309 Allergic rhinitis, unspecified: Secondary | ICD-10-CM

## 2023-03-28 MED ORDER — HYDROCHLOROTHIAZIDE 25 MG PO TABS: 25.0000 mg | ORAL_TABLET | Freq: Every day | ORAL | 0 refills | Status: DC

## 2023-03-28 MED ORDER — MONTELUKAST SODIUM 10 MG PO TABS: 10.0000 mg | ORAL_TABLET | Freq: Every day | ORAL | 0 refills | Status: DC

## 2023-03-28 MED ORDER — OLMESARTAN MEDOXOMIL 40 MG PO TABS: 40.0000 mg | ORAL_TABLET | Freq: Every day | ORAL | 0 refills | Status: DC

## 2023-04-06 ENCOUNTER — Encounter: Payer: Self-pay | Admitting: Family Medicine

## 2023-04-06 ENCOUNTER — Ambulatory Visit: Payer: 59 | Admitting: Family Medicine

## 2023-04-06 VITALS — BP 128/86 | HR 93 | Resp 18 | Ht 64.0 in | Wt 261.0 lb

## 2023-04-06 DIAGNOSIS — I10 Essential (primary) hypertension: Secondary | ICD-10-CM

## 2023-04-06 DIAGNOSIS — J309 Allergic rhinitis, unspecified: Secondary | ICD-10-CM | POA: Diagnosis not present

## 2023-04-06 DIAGNOSIS — Z9109 Other allergy status, other than to drugs and biological substances: Secondary | ICD-10-CM

## 2023-04-06 DIAGNOSIS — Z1159 Encounter for screening for other viral diseases: Secondary | ICD-10-CM | POA: Diagnosis not present

## 2023-04-06 MED ORDER — FLUTICASONE PROPIONATE 50 MCG/ACT NA SUSP: 2.0000 | Freq: Every day | NASAL | 6 refills | Status: AC

## 2023-04-06 MED ORDER — MONTELUKAST SODIUM 10 MG PO TABS: 10.0000 mg | ORAL_TABLET | Freq: Every day | ORAL | 1 refills | Status: DC

## 2023-04-06 MED ORDER — OLMESARTAN MEDOXOMIL 40 MG PO TABS
40.0000 mg | ORAL_TABLET | Freq: Every day | ORAL | 1 refills | Status: AC
Start: 2023-04-06 — End: ?

## 2023-04-06 MED ORDER — HYDROCHLOROTHIAZIDE 25 MG PO TABS: 25.0000 mg | ORAL_TABLET | Freq: Every day | ORAL | 1 refills | Status: DC

## 2023-04-06 NOTE — Progress Notes (Signed)
   Established Patient Office Visit  Subjective   Patient ID: Gina Farrell, female    DOB: [birth year]  Age: 51 y.o. MRN: [id number]  Chief Complaint  Patient presents with   Hypertension    HPI Gina Farrell is a 52 y.o. female presenting today for follow up of hypertension.  ROS Negative unless otherwise noted in HPI   Objective:     BP 128/86 (BP Location: Left Arm, Patient Position: Sitting, Cuff Size: Large)   Pulse 93   Resp 18   Ht 5\' 4"  (1.626 m)   Wt 261 lb (118.4 kg)   SpO2 99%   BMI 44.80 kg/m   Physical Exam   Assessment & Plan:  Benign essential hypertension Assessment & Plan: Blood pressure goal less than 140/90, at goal today.  Continue hydrochlorothiazide 25 mg daily, olmesartan 40 mg daily.  Will collect CMP for electrolytes and renal function.  Will continue to monitor.  Orders: -     hydroCHLOROthiazide; Take 1 tablet (25 mg total) by mouth daily.  Dispense: 90 tablet; Refill: 1 -     Olmesartan Medoxomil; Take 1 tablet (40 mg total) by mouth daily.  Dispense: 90 tablet; Refill: 1 -     CBC with Differential/Platelet; Future -     Comprehensive metabolic panel; Future  Environmental allergies Assessment & Plan: Stable.  Continue montelukast 10 mg daily.  Will continue to monitor.  Orders: -     Montelukast Sodium; Take 1 tablet (10 mg total) by mouth at bedtime.  Dispense: 90 tablet; Refill: 1  Allergic rhinitis, unspecified seasonality, unspecified trigger Assessment & Plan: Stable.  Continue montelukast 10 mg daily, fluticasone nasal spray as needed.  Will continue to monitor.  Orders: -     Fluticasone Propionate; Place 2 sprays into both nostrils daily.  Dispense: 16 g; Refill: 6 -     Montelukast Sodium; Take 1 tablet (10 mg total) by mouth at bedtime.  Dispense: 90 tablet; Refill: 1  Screening for viral disease -     Hepatitis C antibody; Future  Patient agreeable to hepatitis C screening with blood work.  Discussed need for Tdap  and Shingrix vaccines, patient would like to have both at her annual physical in the fall.  Mammogram and Pap smear are up-to-date, need to [facility].  Return for annual physical with Tdap and Shingrix, fasting blood work 1 week before.    Melida Quitter, PA

## 2023-04-06 NOTE — Assessment & Plan Note (Signed)
Blood pressure goal less than 140/90, at goal today.  Continue hydrochlorothiazide 25 mg daily, olmesartan 40 mg daily.  Will collect CMP for electrolytes and renal function.  Will continue to monitor.

## 2023-04-06 NOTE — Patient Instructions (Signed)
It was nice to meet you today!

## 2023-04-06 NOTE — Assessment & Plan Note (Signed)
Stable.  Continue montelukast 10 mg daily, fluticasone nasal spray as needed.  Will continue to monitor.

## 2023-04-06 NOTE — Assessment & Plan Note (Signed)
Stable.  Continue montelukast 10 mg daily.  Will continue to monitor.

## 2023-04-11 ENCOUNTER — Encounter: Payer: Self-pay | Admitting: Family Medicine

## 2023-04-17 ENCOUNTER — Other Ambulatory Visit: Payer: 59

## 2023-04-17 DIAGNOSIS — Z1159 Encounter for screening for other viral diseases: Secondary | ICD-10-CM

## 2023-04-17 DIAGNOSIS — I10 Essential (primary) hypertension: Secondary | ICD-10-CM

## 2023-04-19 LAB — CBC WITH DIFFERENTIAL/PLATELET
Basophils Absolute: 0.1 10*3/uL (ref 0.0–0.2)
Basos: 1 %
EOS (ABSOLUTE): 0.2 10*3/uL (ref 0.0–0.4)
Eos: 2 %
Hematocrit: 39.6 % (ref 34.0–46.6)
Hemoglobin: 11.9 g/dL (ref 11.1–15.9)
Immature Grans (Abs): 0 10*3/uL (ref 0.0–0.1)
Immature Granulocytes: 1 %
Lymphocytes Absolute: 1.5 10*3/uL (ref 0.7–3.1)
Lymphs: 21 %
MCH: 23.2 pg — ABNORMAL LOW (ref 26.6–33.0)
MCHC: 30.1 g/dL — ABNORMAL LOW (ref 31.5–35.7)
MCV: 77 fL — ABNORMAL LOW (ref 79–97)
Monocytes Absolute: 0.7 10*3/uL (ref 0.1–0.9)
Monocytes: 10 %
Neutrophils Absolute: 4.7 10*3/uL (ref 1.4–7.0)
Neutrophils: 65 %
Platelets: 270 10*3/uL (ref 150–450)
RBC: 5.13 x10E6/uL (ref 3.77–5.28)
RDW: 15.4 % (ref 11.7–15.4)
WBC: 7.2 10*3/uL (ref 3.4–10.8)

## 2023-04-19 LAB — COMPREHENSIVE METABOLIC PANEL
ALT: 23 IU/L (ref 0–32)
AST: 19 IU/L (ref 0–40)
Albumin/Globulin Ratio: 1.4 (ref 1.2–2.2)
Albumin: 4 g/dL (ref 3.8–4.9)
Alkaline Phosphatase: 72 IU/L (ref 44–121)
BUN/Creatinine Ratio: 24 — ABNORMAL HIGH (ref 9–23)
BUN: 17 mg/dL (ref 6–24)
Bilirubin Total: 0.3 mg/dL (ref 0.0–1.2)
CO2: 22 mmol/L (ref 20–29)
Calcium: 10 mg/dL (ref 8.7–10.2)
Chloride: 102 mmol/L (ref 96–106)
Creatinine, Ser: 0.72 mg/dL (ref 0.57–1.00)
Globulin, Total: 2.9 g/dL (ref 1.5–4.5)
Glucose: 86 mg/dL (ref 70–99)
Potassium: 4.5 mmol/L (ref 3.5–5.2)
Sodium: 139 mmol/L (ref 134–144)
Total Protein: 6.9 g/dL (ref 6.0–8.5)
eGFR: 101 mL/min/{1.73_m2} (ref >59)

## 2023-04-19 LAB — HEPATITIS C ANTIBODY: Hep C Virus Ab: NONREACTIVE

## 2023-08-18 ENCOUNTER — Other Ambulatory Visit: Payer: Self-pay | Admitting: Family Medicine

## 2023-08-18 DIAGNOSIS — I10 Essential (primary) hypertension: Secondary | ICD-10-CM

## 2023-08-18 DIAGNOSIS — E559 Vitamin D deficiency, unspecified: Secondary | ICD-10-CM

## 2023-08-18 DIAGNOSIS — Z0000 Encounter for general adult medical examination without abnormal findings: Secondary | ICD-10-CM

## 2023-08-18 DIAGNOSIS — E785 Hyperlipidemia, unspecified: Secondary | ICD-10-CM

## 2023-08-23 ENCOUNTER — Other Ambulatory Visit: Payer: 59

## 2023-08-23 DIAGNOSIS — Z0000 Encounter for general adult medical examination without abnormal findings: Secondary | ICD-10-CM

## 2023-08-23 DIAGNOSIS — I10 Essential (primary) hypertension: Secondary | ICD-10-CM

## 2023-08-23 DIAGNOSIS — E785 Hyperlipidemia, unspecified: Secondary | ICD-10-CM

## 2023-08-23 DIAGNOSIS — E559 Vitamin D deficiency, unspecified: Secondary | ICD-10-CM

## 2023-08-24 LAB — CBC WITH DIFFERENTIAL/PLATELET
Basophils Absolute: 0 10*3/uL (ref 0.0–0.2)
Basos: 1 %
EOS (ABSOLUTE): 0.1 10*3/uL (ref 0.0–0.4)
Eos: 2 %
Hematocrit: 39.6 % (ref 34.0–46.6)
Hemoglobin: 12.6 g/dL (ref 11.1–15.9)
Immature Grans (Abs): 0 10*3/uL (ref 0.0–0.1)
Immature Granulocytes: 1 %
Lymphocytes Absolute: 1.3 10*3/uL (ref 0.7–3.1)
Lymphs: 20 %
MCH: 26.1 pg — ABNORMAL LOW (ref 26.6–33.0)
MCHC: 31.8 g/dL (ref 31.5–35.7)
MCV: 82 fL (ref 79–97)
Monocytes Absolute: 0.6 10*3/uL (ref 0.1–0.9)
Monocytes: 10 %
Neutrophils Absolute: 4.4 10*3/uL (ref 1.4–7.0)
Neutrophils: 66 %
Platelets: 273 10*3/uL (ref 150–450)
RBC: 4.82 x10E6/uL (ref 3.77–5.28)
RDW: 15 % (ref 11.7–15.4)
WBC: 6.5 10*3/uL (ref 3.4–10.8)

## 2023-08-24 LAB — COMPREHENSIVE METABOLIC PANEL
ALT: 12 IU/L (ref 0–32)
AST: 18 IU/L (ref 0–40)
Albumin: 3.9 g/dL (ref 3.8–4.9)
Alkaline Phosphatase: 70 IU/L (ref 44–121)
BUN/Creatinine Ratio: 21 (ref 9–23)
BUN: 15 mg/dL (ref 6–24)
Bilirubin Total: 0.3 mg/dL (ref 0.0–1.2)
CO2: 23 mmol/L (ref 20–29)
Calcium: 9.6 mg/dL (ref 8.7–10.2)
Chloride: 102 mmol/L (ref 96–106)
Creatinine, Ser: 0.71 mg/dL (ref 0.57–1.00)
Globulin, Total: 2.7 g/dL (ref 1.5–4.5)
Glucose: 120 mg/dL — ABNORMAL HIGH (ref 70–99)
Potassium: 4 mmol/L (ref 3.5–5.2)
Sodium: 140 mmol/L (ref 134–144)
Total Protein: 6.6 g/dL (ref 6.0–8.5)
eGFR: 103 mL/min/{1.73_m2} (ref >59)

## 2023-08-24 LAB — LIPID PANEL
Chol/HDL Ratio: 3.4 ratio (ref 0.0–4.4)
Cholesterol, Total: 178 mg/dL (ref 100–199)
HDL: 53 mg/dL (ref >39)
LDL Chol Calc (NIH): 111 mg/dL — ABNORMAL HIGH (ref 0–99)
Triglycerides: 73 mg/dL (ref 0–149)
VLDL Cholesterol Cal: 14 mg/dL (ref 5–40)

## 2023-08-24 LAB — HEMOGLOBIN A1C
Est. average glucose Bld gHb Est-mCnc: 111 mg/dL
Hgb A1c MFr Bld: 5.5 % (ref 4.8–5.6)

## 2023-08-24 LAB — VITAMIN D 25 HYDROXY (VIT D DEFICIENCY, FRACTURES): Vit D, 25-Hydroxy: 85.2 ng/mL (ref 30.0–100.0)

## 2023-08-24 LAB — TSH: TSH: 1.17 u[IU]/mL (ref 0.450–4.500)

## 2023-08-28 ENCOUNTER — Encounter: Payer: Self-pay | Admitting: Family Medicine

## 2023-08-28 ENCOUNTER — Ambulatory Visit (INDEPENDENT_AMBULATORY_CARE_PROVIDER_SITE_OTHER): Payer: 59 | Admitting: Family Medicine

## 2023-08-28 VITALS — BP 126/82 | HR 93 | Resp 18 | Ht 64.0 in | Wt 264.0 lb

## 2023-08-28 DIAGNOSIS — E7800 Pure hypercholesterolemia, unspecified: Secondary | ICD-10-CM

## 2023-08-28 DIAGNOSIS — Z0000 Encounter for general adult medical examination without abnormal findings: Secondary | ICD-10-CM

## 2023-08-28 DIAGNOSIS — Z23 Encounter for immunization: Secondary | ICD-10-CM

## 2023-08-28 DIAGNOSIS — I10 Essential (primary) hypertension: Secondary | ICD-10-CM

## 2023-08-28 DIAGNOSIS — Z9109 Other allergy status, other than to drugs and biological substances: Secondary | ICD-10-CM

## 2023-08-28 DIAGNOSIS — J309 Allergic rhinitis, unspecified: Secondary | ICD-10-CM

## 2023-08-28 MED ORDER — MONTELUKAST SODIUM 10 MG PO TABS: 10.0000 mg | ORAL_TABLET | Freq: Every day | ORAL | 1 refills | Status: DC

## 2023-08-28 MED ORDER — OLMESARTAN MEDOXOMIL 40 MG PO TABS
40.0000 mg | ORAL_TABLET | Freq: Every day | ORAL | 1 refills | Status: DC
Start: 2023-08-28 — End: 2024-03-19

## 2023-08-28 MED ORDER — HYDROCHLOROTHIAZIDE 25 MG PO TABS
25.0000 mg | ORAL_TABLET | Freq: Every day | ORAL | 1 refills | Status: DC
Start: 2023-08-28 — End: 2024-03-19

## 2023-08-28 NOTE — Assessment & Plan Note (Signed)
Blood pressure goal less than 140/90, at goal.  Continue hydrochlorothiazide 25 mg daily, olmesartan 40 mg daily.  CMP within normal limits.  Will continue to monitor.

## 2023-08-28 NOTE — Assessment & Plan Note (Addendum)
Last lipid panel: LDL 111, HDL 53, triglycerides 73.  The 10-year ASCVD risk score (Arnett DK, et al., 2019) is: 1.9%.  Continue with weight management efforts, routine physical activity, and limiting trans/saturated fats.  Will continue to monitor.

## 2023-08-28 NOTE — Patient Instructions (Addendum)
Keep up the great work!  Call your insurance company and ask them which weight loss medications they will cover, and what the criteria is for them to cover it.   The podcast that I told you about is The Obesity Guide with Matthea Rentea.  I hope you find it as helpful as I have!  Once you find out the information from your insurance company, you can call to schedule an appointment in about 1 month or so.

## 2023-08-28 NOTE — Progress Notes (Signed)
Complete physical exam  Patient: Gina Farrell   DOB: 11-18-1971   52 y.o. Female  MRN: [id number]  Subjective:    Chief Complaint  Patient presents with   Annual Exam    Gina Farrell is a 52 y.o. female who presents today for a complete physical exam. She reports consuming a general diet.  She tries to walk every day and dances as often as her knee pain will allow.  She generally feels fairly well. She reports sleeping well. She does not have additional problems to discuss today.  She would like to schedule a future appointment to discuss weight management options.   Most recent fall risk assessment:    04/06/2023    3:37 PM  Fall Risk   Falls in the past year? 0  Number falls in past yr: 0  Injury with Fall? 0  Risk for fall due to : No Fall Risks  Follow up Falls evaluation completed     Most recent depression and anxiety screenings:    08/28/2023    2:38 PM 04/06/2023    3:37 PM  PHQ 2/9 Scores  PHQ - 2 Score 0 0  PHQ- 9 Score 0 0      04/06/2023    3:37 PM 08/24/2022    8:36 AM 03/11/2022   10:46 AM 02/17/2022    9:36 AM  GAD 7 : Generalized Anxiety Score  Nervous, Anxious, on Edge 0 0 0 0  Control/stop worrying 0 0 0 0  Worry too much - different things  0 0 0  Trouble relaxing 0 0 0 0  Restless 0 0 0 0  Easily annoyed or irritable 0 0 0 0  Afraid - awful might happen 0 0 0 0  Total GAD 7 Score  0 0 0  Anxiety Difficulty Not difficult at all Not difficult at all  Not difficult at all    Patient Active Problem List   Diagnosis Date Noted   Chronic pain of right knee 12/07/2018   Muscle spasm of back- thoracic and lumbar L > er R 12/07/2018   h/o Asymptomatic microscopic hematuria- in GYN office 12/20/2017   BMI 40.0-44.9, adult (HCC) 04/17/2017   Allergic rhinitis 09/18/2016   GERD (gastroesophageal reflux disease) 01/13/2015   Environmental allergies    Hyperlipidemia    Benign essential hypertension 02/27/2012    Past Surgical History:  Procedure  Laterality Date   CESAREAN SECTION     x 2   d and c     2008   TUBAL LIGATION     Social History   Tobacco Use   Smoking status: Former    Current packs/day: 0.00    Average packs/day: 0.5 packs/day for 14.0 years (7.0 ttl pk-yrs)    Types: Cigarettes    Start date: 11/29/1987    Quit date: 11/28/2001    Years since quitting: 21.7    Passive exposure: Never   Smokeless tobacco: Never  Vaping Use   Vaping status: Never Used  Substance Use Topics   Alcohol use: Not Currently   Drug use: Never   Family History  Problem Relation Age of Onset   Hypertension Mother    Stroke Mother    Atrial fibrillation Mother    Diabetes Mother    Colon cancer Father        recent dx at age 51   Ulcerative colitis Father    Arthritis Father    Cancer Father    Rheum  arthritis Maternal Grandmother    Coronary artery disease Maternal Grandmother    Hypertension Maternal Grandmother    Colon polyps Neg Hx    Esophageal cancer Neg Hx    Stomach cancer Neg Hx    Rectal cancer Neg Hx    Crohn's disease Neg Hx    Allergies  Allergen Reactions   Red Yeast Rice [Cholestin]     Cramps/Heartburn     Patient Care Team: Melida Quitter, PA as PCP - General (Family Medicine) Richardean Chimera, MD as Consulting Physician (Obstetrics and Gynecology) Cherlyn Roberts, MD as Consulting Physician (Dermatology)   Outpatient Medications Prior to Visit  Medication Sig   Cholecalciferol (VITAMIN D3) 5000 units TABS Take 1 tablet by mouth daily.   Cyanocobalamin (VITAMIN B 12 PO) Take by mouth.   fluticasone (FLONASE) 50 MCG/ACT nasal spray Place 2 sprays into both nostrils daily.   meloxicam (MOBIC) 15 MG tablet Take 15 mg by mouth daily.   Multiple Vitamins-Minerals (MULTIVITAMIN PO) Take by mouth daily.   Omega-3 Fatty Acids (FISH OIL) 1000 MG CAPS Take 100 mg by mouth daily.   [DISCONTINUED] hydrochlorothiazide (HYDRODIURIL) 25 MG tablet Take 1 tablet (25 mg total) by mouth daily.    [DISCONTINUED] montelukast (SINGULAIR) 10 MG tablet Take 1 tablet (10 mg total) by mouth at bedtime.   [DISCONTINUED] olmesartan (BENICAR) 40 MG tablet Take 1 tablet (40 mg total) by mouth daily.   No facility-administered medications prior to visit.    Review of Systems  Constitutional:  Negative for chills, fever and malaise/fatigue.  HENT:  Negative for congestion and hearing loss.   Eyes:  Negative for blurred vision and double vision.  Respiratory:  Negative for cough and shortness of breath.   Cardiovascular:  Negative for chest pain, palpitations and leg swelling.  Gastrointestinal:  Negative for abdominal pain, constipation, diarrhea and heartburn.  Genitourinary:  Negative for frequency and urgency.  Musculoskeletal:  Negative for myalgias and neck pain.  Neurological:  Negative for headaches.  Endo/Heme/Allergies:  Negative for polydipsia.  Psychiatric/Behavioral:  Negative for depression. The patient is not nervous/anxious.       Objective:    BP 126/82 Comment: home BP  Pulse 93   Resp 18   Ht 5\' 4"  (1.626 m)   Wt 264 lb (119.7 kg)   SpO2 99%   BMI 45.32 kg/m    Physical Exam Constitutional:      General: She is not in acute distress.    Appearance: Normal appearance.  HENT:     Head: Normocephalic and atraumatic.     Right Ear: Tympanic membrane, ear canal and external ear normal. There is no impacted cerumen.     Left Ear: Tympanic membrane, ear canal and external ear normal. There is no impacted cerumen.     Nose: Nose normal.     Mouth/Throat:     Mouth: Mucous membranes are moist.     Pharynx: No oropharyngeal exudate or posterior oropharyngeal erythema.  Eyes:     Extraocular Movements: Extraocular movements intact.     Conjunctiva/sclera: Conjunctivae normal.     Pupils: Pupils are equal, round, and reactive to light.  Neck:     Thyroid: No thyroid mass, thyromegaly or thyroid tenderness.  Cardiovascular:     Rate and Rhythm: Normal rate and  regular rhythm.     Heart sounds: Normal heart sounds. No murmur heard.    No friction rub. No gallop.  Pulmonary:     Effort: Pulmonary effort is  normal. No respiratory distress.     Breath sounds: Normal breath sounds. No wheezing, rhonchi or rales.  Abdominal:     General: Abdomen is flat. Bowel sounds are normal. There is no distension.     Palpations: There is no mass.     Tenderness: There is no abdominal tenderness. There is no guarding.  Musculoskeletal:        General: Normal range of motion.     Cervical back: Normal range of motion and neck supple.  Lymphadenopathy:     Cervical: No cervical adenopathy.  Skin:    General: Skin is warm and dry.  Neurological:     Mental Status: She is alert and oriented to person, place, and time.     Cranial Nerves: No cranial nerve deficit.     Motor: No weakness.     Deep Tendon Reflexes: Reflexes normal.  Psychiatric:        Mood and Affect: Mood normal.       Assessment & Plan:    Routine Health Maintenance and Physical Exam  Immunization History  Administered Date(s) Administered   Influenza-Unspecified 09/01/2014   PFIZER(Purple Top)SARS-COV-2 Vaccination 02/11/2020, 03/03/2020, 09/17/2020   Td 11/28/2001   Tdap 07/30/2011, 08/28/2023    Health Maintenance  Topic Date Due   COVID-19 Vaccine (4 - 2023-24 season) 09/13/2023 (Originally 07/30/2023)   Zoster Vaccines- Shingrix (1 of 2) 11/27/2023 (Originally 12/18/2021)   INFLUENZA VACCINE  02/26/2024 (Originally 06/29/2023)   MAMMOGRAM  01/25/2025   Colonoscopy  12/28/2027   Cervical Cancer Screening (HPV/Pap Cotest)  01/26/2028   DTaP/Tdap/Td (4 - Td or Tdap) 08/27/2033   Hepatitis C Screening  Completed   HIV Screening  Completed   HPV VACCINES  Aged Out    Reviewed most recent labs including CBC, CMP, lipid panel, A1C, TSH, and vitamin D. All within normal limits/stable from last check other than LDL slightly elevated 111.  Discussed health benefits of physical  activity, and encouraged her to engage in regular exercise appropriate for her age and condition.  Wellness examination  Need for Tdap vaccination -     Tdap vaccine greater than or equal to 7yo IM  Benign essential hypertension Assessment & Plan: Blood pressure goal less than 140/90, at goal.  Continue hydrochlorothiazide 25 mg daily, olmesartan 40 mg daily.  CMP within normal limits.  Will continue to monitor.  Orders: -     hydroCHLOROthiazide; Take 1 tablet (25 mg total) by mouth daily.  Dispense: 90 tablet; Refill: 1 -     Olmesartan Medoxomil; Take 1 tablet (40 mg total) by mouth daily.  Dispense: 90 tablet; Refill: 1  Environmental allergies -     Montelukast Sodium; Take 1 tablet (10 mg total) by mouth at bedtime.  Dispense: 90 tablet; Refill: 1  Allergic rhinitis, unspecified seasonality, unspecified trigger -     Montelukast Sodium; Take 1 tablet (10 mg total) by mouth at bedtime.  Dispense: 90 tablet; Refill: 1  Pure hypercholesterolemia Assessment & Plan: Last lipid panel: LDL 111, HDL 53, triglycerides 73.  The 10-year ASCVD risk score (Arnett DK, et al., 2019) is: 1.9%.  Continue with weight management efforts, routine physical activity, and limiting trans/saturated fats.  Will continue to monitor.     Return in about 6 months (around 02/25/2024) for follow-up for HTN, HLD, fasting blood work 1 week before.     Melida Quitter, PA

## 2023-10-05 ENCOUNTER — Other Ambulatory Visit (HOSPITAL_COMMUNITY): Payer: Self-pay

## 2024-01-04 ENCOUNTER — Telehealth: Payer: Self-pay

## 2024-01-04 ENCOUNTER — Encounter: Payer: Self-pay | Admitting: Family Medicine

## 2024-01-04 NOTE — Telephone Encounter (Signed)
 Contacted pt and informed her where morgan was going and she wanted to be sure she would not have any problems getting refills if her appt gets cancelled.  I told her that we will try our best to make this transition smooth.

## 2024-01-04 NOTE — Telephone Encounter (Signed)
 Copied from CRM [id number]. Topic: General - Other >> Jan 04, 2024 12:25 PM Geneva B wrote: Reason for CRM: patient is calling because she wants to know where Maryclare Smoke is going so she can follow her please call patient back [phone]

## 2024-02-12 ENCOUNTER — Telehealth: Admitting: Nurse Practitioner

## 2024-02-12 DIAGNOSIS — J0140 Acute pansinusitis, unspecified: Secondary | ICD-10-CM

## 2024-02-12 MED ORDER — AMOXICILLIN-POT CLAVULANATE 875-125 MG PO TABS
1.0000 | ORAL_TABLET | Freq: Two times a day (BID) | ORAL | 0 refills | Status: AC
Start: 2024-02-12 — End: 2024-02-19

## 2024-02-12 NOTE — Progress Notes (Signed)
 E-Visit for Sinus Problems  We are sorry that you are not feeling well.  Here is how we plan to help!  Based on what you have shared with me it looks like you have sinusitis.  Sinusitis is inflammation and infection in the sinus cavities of the head.  Based on your presentation I believe you most likely have Acute Bacterial Sinusitis.  This is an infection caused by bacteria and is treated with antibiotics. I have prescribed Augmentin 875mg /125mg  one tablet twice daily with food, for 7 days. You may use an oral decongestant such as Mucinex D or if you have glaucoma or high blood pressure use plain Mucinex. Saline nasal spray help and can safely be used as often as needed for congestion.  If you develop worsening sinus pain, fever or notice severe headache and vision changes, or if symptoms are not better after completion of antibiotic, please schedule an appointment with a health care provider.    Sinus infections are not as easily transmitted as other respiratory infection, however we still recommend that you avoid close contact with loved ones, especially the very young and elderly.  Remember to wash your hands thoroughly throughout the day as this is the number one way to prevent the spread of infection!  Home Care: Only take medications as instructed by your medical team. Complete the entire course of an antibiotic. Do not take these medications with alcohol. A steam or ultrasonic humidifier can help congestion.  You can place a towel over your head and breathe in the steam from hot water coming from a faucet. Avoid close contacts especially the very young and the elderly. Cover your mouth when you cough or sneeze. Always remember to wash your hands.  Get Help Right Away If: You develop worsening fever or sinus pain. You develop a severe head ache or visual changes. Your symptoms persist after you have completed your treatment plan.  Make sure you Understand these instructions. Will watch  your condition. Will get help right away if you are not doing well or get worse.  Thank you for choosing an e-visit.  Your e-visit answers were reviewed by a board certified advanced clinical practitioner to complete your personal care plan. Depending upon the condition, your plan could have included both over the counter or prescription medications.  Please review your pharmacy choice. Make sure the pharmacy is open so you can pick up prescription now. If there is a problem, you may contact your provider through [employer] and have the prescription routed to another pharmacy.  Your safety is important to [place]. If you have drug allergies check your prescription carefully.   For the next 24 hours you can use MyChart to ask questions about today's visit, request a non-urgent call back, or ask for a work or school excuse. You will get an email in the next two days asking about your experience. I hope that your e-visit has been valuable and will speed your recovery.   I spent approximately 5 minutes reviewing the patient's history, current symptoms and coordinating their care today.

## 2024-02-13 ENCOUNTER — Other Ambulatory Visit (HOSPITAL_COMMUNITY): Payer: Self-pay | Admitting: Obstetrics and Gynecology

## 2024-02-13 DIAGNOSIS — Z8249 Family history of ischemic heart disease and other diseases of the circulatory system: Secondary | ICD-10-CM

## 2024-02-13 LAB — HM MAMMOGRAPHY

## 2024-02-19 LAB — HM PAP SMEAR: HPV, high-risk: NEGATIVE

## 2024-02-20 ENCOUNTER — Other Ambulatory Visit: Payer: 59

## 2024-02-26 ENCOUNTER — Ambulatory Visit: Payer: 59 | Admitting: Family Medicine

## 2024-03-08 ENCOUNTER — Other Ambulatory Visit: Payer: Self-pay | Admitting: *Deleted

## 2024-03-08 DIAGNOSIS — E559 Vitamin D deficiency, unspecified: Secondary | ICD-10-CM

## 2024-03-08 DIAGNOSIS — Z6841 Body Mass Index (BMI) 40.0 and over, adult: Secondary | ICD-10-CM

## 2024-03-08 DIAGNOSIS — E785 Hyperlipidemia, unspecified: Secondary | ICD-10-CM

## 2024-03-14 ENCOUNTER — Other Ambulatory Visit

## 2024-03-14 DIAGNOSIS — Z6841 Body Mass Index (BMI) 40.0 and over, adult: Secondary | ICD-10-CM

## 2024-03-14 DIAGNOSIS — E559 Vitamin D deficiency, unspecified: Secondary | ICD-10-CM

## 2024-03-14 DIAGNOSIS — E785 Hyperlipidemia, unspecified: Secondary | ICD-10-CM

## 2024-03-15 LAB — COMPREHENSIVE METABOLIC PANEL WITH GFR
ALT: 13 IU/L (ref 0–32)
AST: 16 IU/L (ref 0–40)
Albumin: 4.1 g/dL (ref 3.8–4.9)
Alkaline Phosphatase: 82 IU/L (ref 44–121)
BUN/Creatinine Ratio: 22 (ref 9–23)
BUN: 16 mg/dL (ref 6–24)
Bilirubin Total: 0.3 mg/dL (ref 0.0–1.2)
CO2: 23 mmol/L (ref 20–29)
Calcium: 9.9 mg/dL (ref 8.7–10.2)
Chloride: 101 mmol/L (ref 96–106)
Creatinine, Ser: 0.73 mg/dL (ref 0.57–1.00)
Globulin, Total: 2.7 g/dL (ref 1.5–4.5)
Glucose: 89 mg/dL (ref 70–99)
Potassium: 4.4 mmol/L (ref 3.5–5.2)
Sodium: 139 mmol/L (ref 134–144)
Total Protein: 6.8 g/dL (ref 6.0–8.5)
eGFR: 99 mL/min/{1.73_m2} (ref >59)

## 2024-03-15 LAB — CBC WITH DIFFERENTIAL/PLATELET
Basophils Absolute: 0.1 10*3/uL (ref 0.0–0.2)
Basos: 1 %
EOS (ABSOLUTE): 0.2 10*3/uL (ref 0.0–0.4)
Eos: 2 %
Hematocrit: 40.1 % (ref 34.0–46.6)
Hemoglobin: 12.7 g/dL (ref 11.1–15.9)
Immature Grans (Abs): 0.1 10*3/uL (ref 0.0–0.1)
Immature Granulocytes: 1 %
Lymphocytes Absolute: 1.7 10*3/uL (ref 0.7–3.1)
Lymphs: 24 %
MCH: 25.7 pg — ABNORMAL LOW (ref 26.6–33.0)
MCHC: 31.7 g/dL (ref 31.5–35.7)
MCV: 81 fL (ref 79–97)
Monocytes Absolute: 0.8 10*3/uL (ref 0.1–0.9)
Monocytes: 11 %
Neutrophils Absolute: 4.6 10*3/uL (ref 1.4–7.0)
Neutrophils: 61 %
Platelets: 259 10*3/uL (ref 150–450)
RBC: 4.94 x10E6/uL (ref 3.77–5.28)
RDW: 14.3 % (ref 11.7–15.4)
WBC: 7.3 10*3/uL (ref 3.4–10.8)

## 2024-03-15 LAB — HEMOGLOBIN A1C
Est. average glucose Bld gHb Est-mCnc: 114 mg/dL
Hgb A1c MFr Bld: 5.6 % (ref 4.8–5.6)

## 2024-03-15 LAB — LIPID PANEL
Chol/HDL Ratio: 3.4 ratio (ref 0.0–4.4)
Cholesterol, Total: 187 mg/dL (ref 100–199)
HDL: 55 mg/dL (ref >39)
LDL Chol Calc (NIH): 117 mg/dL — ABNORMAL HIGH (ref 0–99)
Triglycerides: 83 mg/dL (ref 0–149)
VLDL Cholesterol Cal: 15 mg/dL (ref 5–40)

## 2024-03-15 LAB — VITAMIN D 25 HYDROXY (VIT D DEFICIENCY, FRACTURES): Vit D, 25-Hydroxy: 66.6 ng/mL (ref 30.0–100.0)

## 2024-03-19 ENCOUNTER — Ambulatory Visit: Admitting: Family Medicine

## 2024-03-19 ENCOUNTER — Encounter: Payer: Self-pay | Admitting: Family Medicine

## 2024-03-19 VITALS — BP 132/89 | HR 97 | Ht 64.0 in | Wt 260.0 lb

## 2024-03-19 DIAGNOSIS — Z6841 Body Mass Index (BMI) 40.0 and over, adult: Secondary | ICD-10-CM | POA: Diagnosis not present

## 2024-03-19 DIAGNOSIS — I10 Essential (primary) hypertension: Secondary | ICD-10-CM

## 2024-03-19 DIAGNOSIS — E7800 Pure hypercholesterolemia, unspecified: Secondary | ICD-10-CM

## 2024-03-19 MED ORDER — OLMESARTAN MEDOXOMIL-HCTZ 40-25 MG PO TABS
1.0000 | ORAL_TABLET | Freq: Every day | ORAL | 1 refills | Status: DC
Start: 2024-03-19 — End: 2024-09-18

## 2024-03-19 NOTE — Assessment & Plan Note (Signed)
 Blood pressure slightly elevated today but still under 140/90.  Home readings are usually better than this.  Will send in her 2 medications as a combo pill which she used to take but at some point it was switched to 2 separate pills for unknown reasons.  Possibly insurance related.

## 2024-03-19 NOTE — Assessment & Plan Note (Signed)
 Sending in referral to healthy weight and wellness.  Patient generally tends to overeat in the evenings.  Work is busy and sometimes goes long hours in the afternoon without eating and then overeats to compensate.  She has used calorie counting in the lose it app in the past but has a tendency to stop doing these things if you falls out of her routine.

## 2024-03-19 NOTE — Progress Notes (Deleted)
 Subjective: - Routine follow-up visit - No new issues or concerns - Interested in discussing lab results: LDL and MCH - Inquiring about cardiac CT scan recommended by OBGYN - Concerned about weight management  Past Medical History: - Hypertension: controlled on olmesartan  and hydrochlorothiazide  - Hyperlipidemia: LDL fluctuating between 100-120 for years - Family Hx: Mother had stroke at age 52 (also had diabetes, Afib, other comorbidities); maternal grandmother and great-grandfather had heart attacks in their 47s; no cardiac issues on paternal side - BP: elevated in office, reports home readings typically in 120s/80s, occasionally 130s/88-90 - Last took BP meds 30 mins before appointment - Labs: LDL 117, MCH previously low but normal on current labs, MCV previously low  Assessment: - Hypertension: controlled on current regimen - Hyperlipidemia: LDL 117, calculated 10-year ASCVD risk <3% - Obesity: reports difficulty with weight management despite knowledge of proper nutrition  Plan: - Medications: Change from separate olmesartan  and hydrochlorothiazide  to combination Olmesartan /HCTZ (Benicar  HCT) - Discussed cardiac calcium score CT: appropriate given family history, though not strictly indicated with current ASCVD risk of 2.2% - Counseled on LDL management: diet modifications, reducing saturated fat intake (<15g/day), exercise - Referral to Healthy Weight and Wellness program for structured weight management plan and accountability - Follow up in 6 months

## 2024-03-19 NOTE — Patient Instructions (Signed)
 It was nice to see you today,  We addressed the following topics today: -I think would be reasonable to get the coronary artery calcium test.  Just make sure you let me know when the results are in so I can look at them - I am trying to send in a combination pill of your 2 medications.  If there is any issue with that we will just go back to the 2 separate pills - I will send in a referral to healthy weight and wellness.  If you have not heard from somebody in a month let us  know.  You can also call them directly after a month to see if they have received the referral.  Have a great day,  Etha Henle, MD

## 2024-03-19 NOTE — Progress Notes (Unsigned)
   Established Patient Office Visit  Subjective   Patient ID: Gina Farrell, female    DOB: 30-Aug-1971  Age: 53 y.o. MRN: [id number]  Chief Complaint  Patient presents with   Medical Management of Chronic Issues    HPI - Routine follow-up visit - No new issues or concerns - Interested in discussing lab results: LDL and [facility] - Inquiring about cardiac CT scan recommended by OBGYN - Concerned about weight management  Past Medical History: - Hypertension: controlled on olmesartan  and hydrochlorothiazide  - Hyperlipidemia: LDL fluctuating between 100-120 for years - Family Hx: Mother had stroke at age 63 (also had diabetes, Afib, other comorbidities); maternal grandmother and great-grandfather had heart attacks in their 53s; no cardiac issues on paternal side - BP: elevated in office, reports home readings typically in 120s/80s, occasionally 130s/88-90 - Last took BP meds 30 mins before appointment - Labs: LDL 117, MCH previously low but normal on current labs, MCV previously low   The 10-year ASCVD risk score (Arnett DK, et al., 2019) is: 2%  Health Maintenance Due  Topic Date Due   Zoster Vaccines- Shingrix (1 of 2) Never done   COVID-19 Vaccine (4 - 2024-25 season) 07/30/2023      Objective:     BP 132/89   Pulse 97   Ht 5\' 4"  (1.626 m)   Wt 260 lb (117.9 kg)   LMP 01/10/2024   SpO2 98%   BMI 44.63 kg/m  {Vitals History (Optional):23777}  Physical Exam General: Alert, oriented Pulmonary no respiratory distress Psych: Pleasant affect  No results found for any visits on 03/19/24.      Assessment & Plan:   BMI 40.0-44.9, adult [facility]) Assessment & Plan: Sending in referral to healthy weight and wellness.  Patient generally tends to overeat in the evenings.  Work is busy and sometimes goes long hours in the afternoon without eating and then overeats to compensate.  She has used calorie counting in the lose it app in the past but has a tendency to stop doing these  things if you falls out of her routine.  Orders: -     Amb Ref to Medical Weight Management  Benign essential hypertension Assessment & Plan: Blood pressure slightly elevated today but still under 140/90.  Home readings are usually better than this.  Will send in her 2 medications as a combo pill which she used to take but at some point it was switched to 2 separate pills for unknown reasons.  Possibly insurance related.   Pure hypercholesterolemia Assessment & Plan: Mild LDL elevation, overall ASCVD risk score is less than 3% but she has a family history of coronary artery disease/stroke and her gynecologist recommended/ordered a coronary artery calcium scan.  We discussed reasons to get this.  Advised her it would be reasonable to get it if she is concerned or wants further information regarding her cardiovascular risk.   Other orders -     Olmesartan  Medoxomil-HCTZ; Take 1 tablet by mouth daily.  Dispense: 90 tablet; Refill: 1     Return in about 6 months (around 09/18/2024) for hld, HTN, weight.    Laneta Pintos, MD

## 2024-03-19 NOTE — Assessment & Plan Note (Signed)
 Mild LDL elevation, overall ASCVD risk score is less than 3% but she has a family history of coronary artery disease/stroke and her gynecologist recommended/ordered a coronary artery calcium scan.  We discussed reasons to get this.  Advised her it would be reasonable to get it if she is concerned or wants further information regarding her cardiovascular risk.

## 2024-03-28 ENCOUNTER — Other Ambulatory Visit: Payer: Self-pay | Admitting: Family Medicine

## 2024-03-28 DIAGNOSIS — I10 Essential (primary) hypertension: Secondary | ICD-10-CM

## 2024-04-15 ENCOUNTER — Encounter (INDEPENDENT_AMBULATORY_CARE_PROVIDER_SITE_OTHER): Payer: Self-pay

## 2024-04-24 ENCOUNTER — Other Ambulatory Visit: Payer: Self-pay | Admitting: Family Medicine

## 2024-04-24 DIAGNOSIS — J309 Allergic rhinitis, unspecified: Secondary | ICD-10-CM

## 2024-04-24 DIAGNOSIS — Z9109 Other allergy status, other than to drugs and biological substances: Secondary | ICD-10-CM

## 2024-04-30 ENCOUNTER — Encounter (INDEPENDENT_AMBULATORY_CARE_PROVIDER_SITE_OTHER): Payer: Self-pay

## 2024-05-27 ENCOUNTER — Ambulatory Visit (INDEPENDENT_AMBULATORY_CARE_PROVIDER_SITE_OTHER): Admitting: Family Medicine

## 2024-05-27 ENCOUNTER — Encounter (INDEPENDENT_AMBULATORY_CARE_PROVIDER_SITE_OTHER): Payer: Self-pay

## 2024-05-27 ENCOUNTER — Encounter (INDEPENDENT_AMBULATORY_CARE_PROVIDER_SITE_OTHER): Payer: Self-pay | Admitting: Family Medicine

## 2024-05-27 VITALS — BP 138/86 | HR 93 | Temp 98.7°F | Ht 63.5 in | Wt 259.0 lb

## 2024-05-27 DIAGNOSIS — Z6841 Body Mass Index (BMI) 40.0 and over, adult: Secondary | ICD-10-CM | POA: Diagnosis not present

## 2024-05-27 DIAGNOSIS — I10 Essential (primary) hypertension: Secondary | ICD-10-CM

## 2024-05-27 DIAGNOSIS — E785 Hyperlipidemia, unspecified: Secondary | ICD-10-CM

## 2024-05-27 DIAGNOSIS — Z0289 Encounter for other administrative examinations: Secondary | ICD-10-CM

## 2024-05-27 DIAGNOSIS — E66813 Obesity, class 3: Secondary | ICD-10-CM

## 2024-05-27 NOTE — Progress Notes (Signed)
 Gina DOROTHA Jenkins, DO, ABFM, ABOM Bariatric physician [address] [place], [place], [place] [address] Office: [phone]  /  Fax: [phone]     Initial Evaluation:  Gina Farrell was seen in clinic today to evaluate for obesity. She is interested in losing weight to improve overall health and reduce the risk of weight related complications. She presents today to review program treatment options, initial physical assessment, and evaluation.      She was referred by: PCP  When asked how has your weight affected you? She states: Contributed to medical problems, Contributed to orthopedic problems or mobility issues, and Problems with eating patterns  Contributing factors to her weight change: family history of obesity nutritional, stress, problems w eating patters, menopause.   Some associated conditions: Hypertension, Arthritis: Knee O.A, Hyperlipidemia, and GERD.   Current nutrition plan: Low-carb and cut out soda consumption.   Current level of physical activity: Walking 10-15 minutes about  2-3 times per week.   Current or previous pharmacotherapy: None  Response to medication: Never tried medications Of note, pt is not interested   Pt identifies putting everyone else first as a barrier to her weight loss. She adds that she has not prioritized her own physical health when helping others.    Past Medical History:  Diagnosis Date   Allergic rhinitis    Allergy    Arthritis    touch knee   Chicken pox    GERD (gastroesophageal reflux disease)    Hypertension    Miscarriage 1998/2008   Vitamin D  deficiency    past hx - controlled on vitamin D     Current Outpatient Medications  Medication Instructions   Cholecalciferol (VITAMIN D3) 5000 units TABS 1 tablet, Daily   Cyanocobalamin (VITAMIN B 12 PO) Take by mouth.   Fish Oil 100 mg, Daily   fluticasone  (FLONASE ) 50 MCG/ACT nasal spray 2 sprays, Each Nare, Daily   meloxicam (MOBIC) 15 mg, Daily   montelukast  (SINGULAIR )  10 mg, Oral, Daily at bedtime   Multiple Vitamins-Minerals (MULTIVITAMIN PO) Daily   olmesartan -hydrochlorothiazide  (BENICAR  HCT) 40-25 MG tablet 1 tablet, Oral, Daily     Allergies  Allergen Reactions   Red Yeast Rice [Cholestin]     Cramps/Heartburn     Past Surgical History:  Procedure Laterality Date   CESAREAN SECTION     x 2   d and c     2008   TUBAL LIGATION       Family History  Problem Relation Age of Onset   Hypertension Mother    Stroke Mother    Atrial fibrillation Mother    Diabetes Mother    Colon cancer Father        recent dx at age 14   Ulcerative colitis Father    Arthritis Father    Cancer Father    Rheum arthritis Maternal Grandmother    Coronary artery disease Maternal Grandmother    Hypertension Maternal Grandmother    Colon polyps Neg Hx    Esophageal cancer Neg Hx    Stomach cancer Neg Hx    Rectal cancer Neg Hx    Crohn's disease Neg Hx      Objective:  BP 138/86   Pulse 93   Temp 98.7 F (37.1 C)   Ht 5' 3.5 (1.613 m)   Wt 259 lb (117.5 kg)   SpO2 100%   BMI 45.16 kg/m  She was weighed on the bioimpedance scale: Body mass index is 45.16 kg/m.   Visceral  Fat %:   13  , Body Fat %:   37.1%   Vitals Temp: 98.7 F (37.1 C) BP: 138/86 Pulse Rate: 93 SpO2: 100 %   Anthropometric Measurements Height: 5' 3.5 (1.613 m) Weight: 259 lb (117.5 kg) BMI (Calculated): 45.15 Weight at Last Visit: N/a Weight Lost Since Last Visit: N/a Weight Gained Since Last Visit: N/a Starting Weight: N/a Total Weight Loss (lbs): 0 lb (0 kg)   Body Composition  Body Fat %: 37.1 % Fat Mass (lbs): 96.2 lbs Muscle Mass (lbs): 15 lbs Total Body Water (lbs): 87.47 lbs Visceral Fat Rating : 13   Other Clinical Data Fasting: No Labs: no Comments: Info session     General: Well Developed, well nourished, and in no acute distress.  HEENT: Normocephalic, atraumatic; EOMI, sclerae are anicteric. Skin: Warm and dry, good turgor Chest:   Normal excursion, shape, no gross ABN Respiratory: No conversational dyspnea; speaking in full sentences NeuroM-Sk:  Normal gross ROM * 4 extremities  Psych: A and O *3, insight adequate, mood- full    Assessment and Plan:   FOR THE DISEASE OF OBESITY:  Class 3 severe obesity with body mass index (BMI) of 45.0 to 49.9 in adult, unspecified obesity type, unspecified whether serious comorbidity present BMI 45.0-49.9, adult -- Current BMI 45.15 Assessment & Plan: We reviewed anthropometrics, biometrics, associated medical conditions and contributing factors with patient. Gina Farrell would benefit from a medically tailored reduced calorie nutrional plan based on their REE (resting energy expenditure), which will be determined by indirect calorimetry.  We will also assess for cardiometabolic risk and nutritional derangements via fasting labs at intake appointment.    Obesity Treatment / Action Plan:   she was weighed on the bioimpedance scale and results were discussed and documented in the synopsis.   Gina Farrell will complete provided nutritional and psychosocial assessment questionnaire before the next appointment.  she will be scheduled for indirect calorimetry to determine resting energy expenditure in a fasting state.  This will allow us  to create a reduced calorie, high-protein meal plan to promote loss of fat mass while preserving muscle mass.  We will also assess for cardiometabolic risk and nutritional derangements via an ECG and fasting serologies at her next appointment.  she was encouraged to work on amassing support from family and friends to begin their weight loss journey.   Work on eliminating or reducing the presence of highly processed, poorly nutritious, calorie-dense foods in the home.   Obesity Education Performed Today:  Patient was counseled on nutritional approaches to weight loss and benefits of reducing processed foods and consuming plant-based foods and high  quality protein as part of nutritional weight management program.   We discussed the importance of long term lifestyle changes which include nutrition, exercise and behavioral modifications as well as the importance of customizing this to her specific health and social needs.   We discussed the benefits of reaching a healthier weight to alleviate the symptoms of existing conditions and reduce the risks of the biomechanical, metabolic and psychological effects of obesity.  Was counseled on the health benefits of losing 5%-10% of total body weight.  Was counseled on our cognitive behavorial therapy program, lead by our bariatric psychologist, who focuses on emotional eating and creating positive behavorial change.  Was counseled on bariatric pharmacotherapy and how this may be used as an adjunct in their weight management    Gina Farrell appears to be in the action stage of change and states they are ready  to start intensive lifestyle modifications and behavioral modifications.  It was recommended that she follow up in the next 1-2 weeks to review the above steps, and to continue with treatment of their chronic disease state of obesity   FOR OTHER CONDITIONS RELATED TO THE DISEASE OF OBESITY:  Benign essential hypertension Assessment & Plan: BP Readings from Last 3 Encounters:  05/27/24 138/86  03/19/24 132/89  08/28/23 126/82   BP is stable; per pt it is around her baseline. Complaint with Benicar  HCT 40-25 mg. Tolerating well with no SE. Ideal BP goal reviewed with pt. Counseling on how condition may improve with lifestyle modifications such as healthy diet and regular exercise. Avoid high-sodium foods and avoid processed foods. Continue med regimen as rx'ed. Will continue monitoring alongside PCP if pt decides to enroll in our program.     Hyperlipidemia, unspecified hyperlipidemia type Assessment & Plan: Lab Results  Component Value Date   CHOL 187 03/14/2024   HDL 55 03/14/2024    LDLCALC 117 (H) 03/14/2024   TRIG 83 03/14/2024   CHOLHDL 3.4 03/14/2024   Pt is on fish oil 1000 mcg daily with good compliance and tolerance. Reports eating outside the home at times. Discussed how lifestyle modifications such as healthy diet and exercise can help improve her condition. Avoid saturated/trans fats and limit frequency of eating out. Additional lifestyle modifications will be discussed at future visits if pt desires to enroll in the program.     Attestations:   I, Vernell Forest, acting as a medical scribe for Gina Jenkins, DO., have compiled all relevant documentation for today's office visit on behalf of Gina Jenkins, DO, while in the presence of [employer], DO.  I have spent 47 minutes in the care of the patient today.  40 minutes was spent in face to face counseling of the patient on the disease of obesity and what our program can do for their medical conditions as well as in preventing future diseases. I discussed the importance of comprehensive care in the treatment of obesity including mental well being and physical activity.   I have reviewed the above documentation for accuracy and completeness, and I agree with the above. Gina JINNY Farrell, D.O.  The 21st Century Cures Act was signed into law in 2016 which includes the topic of electronic health records.  This provides immediate access to information in MyChart.  This includes consultation notes, operative notes, office notes, lab results and pathology reports.  If you have any questions about what you read please let us  know at your next visit so we can discuss your concerns and take corrective action if need be.  We are right here with you!

## 2024-06-06 ENCOUNTER — Ambulatory Visit (HOSPITAL_COMMUNITY)
Admission: RE | Admit: 2024-06-06 | Discharge: 2024-06-06 | Disposition: A | Discharge status: Home / Self Care | Payer: Self-pay | Source: Ambulatory Visit | Attending: Obstetrics and Gynecology | Admitting: Obstetrics and Gynecology

## 2024-06-06 DIAGNOSIS — Z8249 Family history of ischemic heart disease and other diseases of the circulatory system: Secondary | ICD-10-CM | POA: Insufficient documentation

## 2024-06-25 ENCOUNTER — Encounter (INDEPENDENT_AMBULATORY_CARE_PROVIDER_SITE_OTHER): Payer: Self-pay | Admitting: Family Medicine

## 2024-06-25 ENCOUNTER — Ambulatory Visit (INDEPENDENT_AMBULATORY_CARE_PROVIDER_SITE_OTHER): Admitting: Family Medicine

## 2024-06-25 VITALS — BP 136/83 | HR 92 | Temp 98.4°F | Ht 63.5 in | Wt 255.0 lb

## 2024-06-25 DIAGNOSIS — Z6841 Body Mass Index (BMI) 40.0 and over, adult: Secondary | ICD-10-CM

## 2024-06-25 DIAGNOSIS — E785 Hyperlipidemia, unspecified: Secondary | ICD-10-CM

## 2024-06-25 DIAGNOSIS — E559 Vitamin D deficiency, unspecified: Secondary | ICD-10-CM | POA: Diagnosis not present

## 2024-06-25 DIAGNOSIS — Z1331 Encounter for screening for depression: Secondary | ICD-10-CM | POA: Diagnosis not present

## 2024-06-25 DIAGNOSIS — R5383 Other fatigue: Secondary | ICD-10-CM

## 2024-06-25 DIAGNOSIS — R0602 Shortness of breath: Secondary | ICD-10-CM | POA: Diagnosis not present

## 2024-06-25 DIAGNOSIS — E65 Localized adiposity: Secondary | ICD-10-CM

## 2024-06-25 DIAGNOSIS — I10 Essential (primary) hypertension: Secondary | ICD-10-CM | POA: Diagnosis not present

## 2024-06-25 DIAGNOSIS — M1990 Unspecified osteoarthritis, unspecified site: Secondary | ICD-10-CM | POA: Diagnosis not present

## 2024-06-25 DIAGNOSIS — E538 Deficiency of other specified B group vitamins: Secondary | ICD-10-CM

## 2024-06-25 NOTE — Progress Notes (Signed)
**Note Gina Farrell-Identified via Obfuscation**  Gina Farrell, D.O.  ABFM, ABOM Specializing in Clinical Bariatric Medicine Office located at: [address] [place], [place]  [address]    Bariatric Medicine Visit  Dear Gina Saddie FALCON, PA-C   Thank you for referring Gina Farrell to our clinic today for evaluation.  We performed a consultation to discuss her options for treatment and educate the patient on her disease state.  The following note includes my evaluation and treatment recommendations.   Please do not hesitate to reach out to me directly if you have any further concerns.    Assessment and Plan:   Orders Placed This Encounter  Procedures   VITAMIN D  25 Hydroxy (Vit-D Deficiency, Fractures)   TSH   T4, free   Lipid panel   Insulin , random   Hemoglobin A1c   Folate   Comprehensive metabolic panel with GFR   Vitamin B12   CBC with Differential/Platelet   EKG 12-Lead    FOR THE DISEASE OF OBESITY:  BMI 40.0-44.9, adult (HCC) current 44.6 Starting Morbid obesity (HCC) current 44.6 Assessment & Plan: Gina Farrell is currently in the action stage of change. As such, her goal is to start our weight management plan.  She has agreed to implement the Category 3 eating plan with lunch options and 4 extra ounces of lean protein; pt also provided journaling  parameters (1950-2050 cal and 120+ g protein daily) as a guide.    Behavioral Intervention We discussed the following Behavioral Modification Strategies today: sources of healthy fats, eating on a regular basis, increasing lean protein intake to established goals, decreasing simple carbohydrates , avoiding skipping meals, staying on track while traveling and vacationing, and focusing on food with a 10:1 ratio of calories: grams of protein  Additional resources provided today: Handout on Examples of Low Glycemic Index and Low Calorie Fruits & Vegetables, Handout on CAT 3 meal plan, and Handout on CAT 3-4 lunch options  Evidence-based interventions for health  behavior change were utilized today including the discussion of self monitoring techniques, problem-solving barriers and SMART goal setting techniques.    Pt will specifically work on measuring their intake of lean proteins and veggies   Recommended Physical Activity Goals Gina Farrell has been advised to work up to 150 minutes of moderate intensity aerobic activity a week and strengthening exercises 2-3 times per week for cardiovascular health, weight loss maintenance and preservation of muscle mass.   She has agreed to maintain current level of activity.    Pharmacotherapy Begin nutritional and behavioral strategies   ASSOCIATED CONDITIONS ADDRESSED TODAY:   Fatigue Assessment & Plan: Gina Farrell does feel that her weight is causing her energy to be lower than it should be. Fatigue may be related to obesity, depression or many other causes. she does not appear to have any red flag symptoms and this appears to most likely be related to her current lifestyle habits and dietary intake.  Labs will be ordered and reviewed with her at their next office visit in two weeks.  Epworth sleepiness scale is 3 and appears to be within normal limits. Gina Farrell admits to some daytime somnolence and admits to waking up still tired. Patient has morning headaches 1-2 times per month. Gina Farrell generally gets 6-8 hours of sleep per night, and states that she has generally unrestful sleep. She attributes her unrestful sleep to waking up at night due to hot flashes or to urinate.  Snoring is present. Apneic episodes is not present.   Encouraged pt to aim  for 7-9 hrs of sleep per night for the best weight loss results and for her overall health/well being.   ECG: Performed and reviewed/ interpreted independently.  Normal sinus rhythm, rate 81 bpm; reassuring without any acute abnormalities, will continue to monitor for symptoms     Shortness of breath on exertion Assessment & Plan: Gina Farrell does feel that she gets out of  breath more easily than she used to when she exercises and seems to be worsening over time with weight gain.  This has gotten worse recently. Gina Farrell denies shortness of breath at rest or orthopnea. Pt denies chest pain, dizziness, heart palpitations, or excessive diaphoresis or nausea with activity.  This is not new and is ongoing.  Gina Farrell's shortness of breath appears to be obesity related and exercise induced, as they do not appear to have any red flag symptoms/ concerns today.  Also, this condition appears to be related to a state of poor cardiovascular conditioning   Obtain labs today and will be reviewed with her at their next office visit in two weeks.  Indirect Calorimeter completed today to help guide our dietary regimen. It shows a VO2 of 381 and a REE of 2635.  Her calculated basal metabolic rate is 8066 thus her resting energy expenditure is better than expected.  Patient agreed to work on weight loss at this time.  As Gina Farrell progresses through our weight loss program, we will gradually increase exercise as tolerated to treat her current condition.   If Gina Farrell follows our recommendations and loses 5-10% of their weight without improvement of her shortness of breath or if at any time, symptoms become more concerning, they agree to urgently follow up with their PCP/ specialist for further consideration/ evaluation.   Gina Farrell verbalizes agreement with this plan.     Depression Screen  Assessment & Plan: Her PHQ-9 score was 0. Denies SI/HI. Mood is stable. Per new patient packet, she endorsed sometimes eating when bored and as a reward. Upon interview of patient today, she does not feel that emotional eating is a significant issue for her.   - We will monitor this closely and work on CBT to help improve any  non-hunger eating patterns.    Benign essential hypertension Assessment & Plan: Last 3 blood pressure readings in our office are as follows: BP Readings from Last 3 Encounters:   06/25/24 136/83  05/27/24 138/86  03/19/24 132/89   The 10-year ASCVD risk score (Arnett DK, et al., 2019) is: 2.1%  Lab Results  Component Value Date   CREATININE 0.73 03/14/2024   HTN treated with Olmesartan -hydrochlorothiazide  40/25 mg daily. BP stable today. Pt asx. BP at home averages 130s/80s.   - Continue adherence to antihypertensive therapy. - Begin to work on nutrition plan to promote weight loss and improve BP control.     Hyperlipidemia, unspecified hyperlipidemia type Assessment & Plan: Lab Results  Component Value Date   CHOL 187 03/14/2024   HDL 55 03/14/2024   LDLCALC 117 (H) 03/14/2024   TRIG 83 03/14/2024   CHOLHDL 3.4 03/14/2024   Pt is on fish oil 100 mg daily with good compliance and tolerance. Not taking any statins.   - Begin to work on nutrition plan -decreasing simple carbohydrates, increasing lean proteins, decreasing saturated fats and cholesterol , avoiding trans fats as able to promote weight loss, improve lipids and decrease cardiovascular risks.     Visceral obesity Assessment & Plan: Current visceral fat rating: 15.  The visceral fat rating should be <  12 in a female.  -  Visceral adipose tissue is a hormonally active component of total body fat. This body composition phenotype is associated with medical disorders such as metabolic syndrome, cardiovascular disease and several malignancies including prostate, breast, and colorectal cancers. -Starting goal: Lose 7-10% of weight via prudent nutritional plan and lifestyle changes.      Generalized arthritis Assessment & Plan: She has a history of generalized arthritis. Reports compliance with Meloxicam 15 mg prn. No acute concerns today.   - Continue Meloxicam prn.  - Begin low-inflammatory meal plan.  - losing 10% or more of body weight may improve condition     Vitamin D  deficiency Assessment & Plan: Lab Results  Component Value Date   VD25OH 66.6 03/14/2024   VD25OH 85.2  08/23/2023   VD25OH 65.9 08/26/2022   She reports having a history of Vit D deficiency. She is currently on OTC Vit D 5,000 units daily.   - Continue Vit D supplementation. - Check Vit D levels today.     B12 deficiency Assessment & Plan: She has been on daily OTC B12 supplementation for years. No acute concerns.   - Check B12 levels today.  - Continue supplementation; will provide further guidance next OV.     FOLLOW UP:   Follow up in 2 weeks. She was informed of the importance of frequent follow up visits to maximize her success with intensive lifestyle modifications for her multiple health conditions.  Gina Farrell is aware that we will review all of her lab results at our next visit.  She is aware that if anything is critical/ life threatening with the results, we will be contacting her via MyChart prior to the office visit to discuss management.     Chief Complaint:   OBESITY BARRI NEIDLINGER (MR# [id number]) is a pleasant 53 y.o. female who presents for evaluation and treatment of obesity and related comorbidities. Current BMI is Body mass index is 44.46 kg/m. Gina Farrell has been struggling with her weight for many years and has been unsuccessful in either losing weight, maintaining weight loss, or reaching her healthy weight goal.  Gina Farrell is currently in the action stage of change and ready to dedicate time achieving and maintaining a healthier weight. Gina Farrell is interested in becoming our patient and working on intensive lifestyle modifications including (but not limited to) diet and exercise for weight loss.  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 Patient is married to Jackson and has 3 children. She lives with her husband and 61 y.o daughter Tinnie.   Heaviest weight was 270 lbs after the birth of her first child.   Desire to be 175 lbs as this was her last healthy weight prior to having children.   Has  tried keto, weight watchers (worked best for her due to the specific guidance), and optavia in the past.   Exercise regimen: walks 15 minutes 2-3 days a week  Eats fast food or take out 2-3 days a week  Tends to snack on something sweet or a fruit.   Craves sweets in mid-afternoon and evenings   Skips breakfast/lunch 3 times a week.   Her liquid calorie intake includes coffee with creamer and sweeteners, milk, juice, tea with sugar, and protein drinks. Denies drinking regular soda.   Worst food habit: eating sweets    Subjective:   This is the patient's first visit at [facility].  The patient's NEW PATIENT PACKET that they filled out prior to today's office visit was reviewed at length and information from that paperwork was included within the following office visit note.    Included in the packet: current and past health history, medications, allergies, ROS, gynecologic history (women only), surgical history, family history, social history, weight history, weight loss surgery history (for those that have had weight loss surgery), nutritional evaluation, mood and food questionnaire along with a depression screening (PHQ9) on all patients, an Epworth questionnaire, sleep habits questionnaire, patient life and health improvement goals questionnaire. These will all be scanned into the patient's chart under the media tab.   Review of Systems: Please refer to new patient packet scanned into media. Pertinent positives were addressed with patient today.  Reviewed by clinician on day of visit: allergies, medications, problem list, medical history, surgical history, family history, social history, and previous encounter notes.  During the visit, I independently reviewed the patient's EKG, bioimpedance scale results, and indirect calorimeter results. I used this information to tailor a meal plan for the patient that will help Gina Farrell to lose weight and will improve her  obesity-related conditions going forward.  I performed a medically necessary appropriate examination and/or evaluation. I discussed the assessment and treatment plan with the patient. The patient was provided an opportunity to ask questions and all were answered. The patient agreed with the plan and demonstrated an understanding of the instructions. Labs were ordered today (unless patient declined them) and will be reviewed with the patient at our next visit unless more critical results need to be addressed immediately. Clinical information was updated and documented in the EMR.    Objective:   PHYSICAL EXAM: Blood pressure 136/83, pulse 92, temperature 98.4 F (36.9 C), height 5' 3.5 (1.613 m), weight 255 lb (115.7 kg), SpO2 99%. Body mass index is 44.46 kg/m.  General: Well Developed, well nourished, and in no acute distress.  HEENT: Normocephalic, atraumatic; EOMI, sclerae are anicteric. Skin: Warm and dry, good turgor Chest:  Normal excursion, shape, no gross ABN Respiratory: No conversational dyspnea; speaking in full sentences NeuroM-Sk:  Normal gross ROM * 4 extremities  Psych: A and O *3, insight adequate, mood- full   Anthropometric Measurements Height: 5' 3.5 (1.613 m) Weight: 255 lb (115.7 kg) BMI (Calculated): 44.46 Weight at Last Visit: 0lb Weight Lost Since Last Visit: 0lb Weight Gained Since Last Visit: 0lb Starting Weight: 255lb Total Weight Loss (lbs): 0 lb (0 kg) Peak Weight: 270lb Waist Measurement : 49 inches   Body Composition  Body Fat %: 46.3 % Fat Mass (lbs): 118.4 lbs Muscle Mass (lbs): 130.2 lbs Total Body Water (lbs): 91.2 lbs Visceral Fat Rating : 15   Other Clinical Data RMR: [id number] Fasting: Yes Labs: Yes Today's Visit #: 1 Starting Date: 06/25/24 Comments: First Visit    DIAGNOSTIC DATA REVIEWED:  BMET    Component Value Date/Time   NA 139 03/14/2024 1058   K 4.4 03/14/2024 1058   CL 101 03/14/2024 1058   CO2 23 03/14/2024 1058    GLUCOSE 89 03/14/2024 1058   GLUCOSE 91 05/14/2021 1334   BUN 16 03/14/2024 1058   CREATININE 0.73 03/14/2024 1058   CREATININE 0.62 01/29/2014 1053   CALCIUM 9.9 03/14/2024 1058   GFRNONAA >60 05/14/2021 1334   GFRNONAA >89 01/29/2014 1053   GFRAA 121 04/16/2020 0940   GFRAA >89 01/29/2014 1053   Lab Results  Component Value Date   HGBA1C  5.6 03/14/2024   HGBA1C 5.2 01/29/2014   No results found for: INSULIN  Lab Results  Component Value Date   TSH 1.170 08/23/2023   CBC    Component Value Date/Time   WBC 7.3 03/14/2024 1058   WBC 7.1 05/14/2021 1311   RBC 4.94 03/14/2024 1058   RBC 4.73 05/14/2021 1311   HGB 12.7 03/14/2024 1058   HCT 40.1 03/14/2024 1058   PLT 259 03/14/2024 1058   MCV 81 03/14/2024 1058   MCH 25.7 (L) 03/14/2024 1058   MCH 25.8 (L) 05/14/2021 1311   MCHC 31.7 03/14/2024 1058   MCHC 32.9 05/14/2021 1311   RDW 14.3 03/14/2024 1058   Iron Studies No results found for: IRON, TIBC, FERRITIN, IRONPCTSAT Lipid Panel     Component Value Date/Time   CHOL 187 03/14/2024 1058   TRIG 83 03/14/2024 1058   HDL 55 03/14/2024 1058   CHOLHDL 3.4 03/14/2024 1058   CHOLHDL 4 01/19/2016 0848   VLDL 19.2 01/19/2016 0848   LDLCALC 117 (H) 03/14/2024 1058   Hepatic Function Panel     Component Value Date/Time   PROT 6.8 03/14/2024 1058   ALBUMIN 4.1 03/14/2024 1058   AST 16 03/14/2024 1058   ALT 13 03/14/2024 1058   ALKPHOS 82 03/14/2024 1058   BILITOT 0.3 03/14/2024 1058   BILIDIR 0.1 01/29/2014 1053   IBILI 0.3 01/29/2014 1053      Component Value Date/Time   TSH 1.170 08/23/2023 0908   Nutritional Lab Results  Component Value Date   VD25OH 66.6 03/14/2024   VD25OH 85.2 08/23/2023   VD25OH 65.9 08/26/2022    Attestation Statements:   I, Special Puri, acting as a Stage manager for [employer], DO., have compiled all relevant documentation for today's office visit on behalf of Gina Jenkins, DO, while in the presence of  [employer], DO.  I have spent 72 minutes in the care of the patient today including 67 minutes face-to-face assessing and reviewing listed medical problems above as outlined in office visit note, providing nutritional and behavioral counseling as outlined in obesity care plan, independently interpreting results and goals of care, see listed medical problems, and discussing biometric information and progress.   I have reviewed the above documentation for accuracy and completeness, and I agree with the above. Gina JINNY Farrell, D.O.  The 21st Century Cures Act was signed into law in 2016 which includes the topic of electronic health records.  This provides immediate access to information in MyChart.  This includes consultation notes, operative notes, office notes, lab results and pathology reports.  If you have any questions about what you read please let us  know at your next visit so we can discuss your concerns and take corrective action if need be.  We are right here with you.

## 2024-06-26 ENCOUNTER — Telehealth (INDEPENDENT_AMBULATORY_CARE_PROVIDER_SITE_OTHER): Payer: Self-pay

## 2024-06-26 LAB — T4, FREE: Free T4: 1.06 ng/dL (ref 0.82–1.77)

## 2024-06-26 LAB — COMPREHENSIVE METABOLIC PANEL WITH GFR
ALT: 13 IU/L (ref 0–32)
AST: 16 IU/L (ref 0–40)
Albumin: 4.3 g/dL (ref 3.8–4.9)
Alkaline Phosphatase: 82 IU/L (ref 44–121)
BUN/Creatinine Ratio: 22 (ref 9–23)
BUN: 16 mg/dL (ref 6–24)
Bilirubin Total: 0.3 mg/dL (ref 0.0–1.2)
CO2: 20 mmol/L (ref 20–29)
Calcium: 10.4 mg/dL — ABNORMAL HIGH (ref 8.7–10.2)
Chloride: 99 mmol/L (ref 96–106)
Creatinine, Ser: 0.74 mg/dL (ref 0.57–1.00)
Globulin, Total: 3.1 g/dL (ref 1.5–4.5)
Glucose: 86 mg/dL (ref 70–99)
Potassium: 4.1 mmol/L (ref 3.5–5.2)
Sodium: 138 mmol/L (ref 134–144)
Total Protein: 7.4 g/dL (ref 6.0–8.5)
eGFR: 97 mL/min/1.73 (ref >59)

## 2024-06-26 LAB — CBC WITH DIFFERENTIAL/PLATELET
Basophils Absolute: 0.1 x10E3/uL (ref 0.0–0.2)
Basos: 1 %
EOS (ABSOLUTE): 0.1 x10E3/uL (ref 0.0–0.4)
Eos: 2 %
Hematocrit: 41 % (ref 34.0–46.6)
Hemoglobin: 12.8 g/dL (ref 11.1–15.9)
Immature Grans (Abs): 0 x10E3/uL (ref 0.0–0.1)
Immature Granulocytes: 0 %
Lymphocytes Absolute: 1.4 x10E3/uL (ref 0.7–3.1)
Lymphs: 23 %
MCH: 26.1 pg — ABNORMAL LOW (ref 26.6–33.0)
MCHC: 31.2 g/dL — ABNORMAL LOW (ref 31.5–35.7)
MCV: 84 fL (ref 79–97)
Monocytes Absolute: 0.6 x10E3/uL (ref 0.1–0.9)
Monocytes: 9 %
Neutrophils Absolute: 4.1 x10E3/uL (ref 1.4–7.0)
Neutrophils: 64 %
Platelets: 241 x10E3/uL (ref 150–450)
RBC: 4.9 x10E6/uL (ref 3.77–5.28)
RDW: 14.2 % (ref 11.7–15.4)
WBC: 6.3 x10E3/uL (ref 3.4–10.8)

## 2024-06-26 LAB — INSULIN, RANDOM: INSULIN: 36.4 u[IU]/mL — ABNORMAL HIGH (ref 2.6–24.9)

## 2024-06-26 LAB — LIPID PANEL
Chol/HDL Ratio: 3.5 ratio (ref 0.0–4.4)
Cholesterol, Total: 190 mg/dL (ref 100–199)
HDL: 54 mg/dL (ref >39)
LDL Chol Calc (NIH): 118 mg/dL — ABNORMAL HIGH (ref 0–99)
Triglycerides: 97 mg/dL (ref 0–149)
VLDL Cholesterol Cal: 18 mg/dL (ref 5–40)

## 2024-06-26 LAB — TSH: TSH: 1.21 u[IU]/mL (ref 0.450–4.500)

## 2024-06-26 LAB — VITAMIN D 25 HYDROXY (VIT D DEFICIENCY, FRACTURES): Vit D, 25-Hydroxy: 70.7 ng/mL (ref 30.0–100.0)

## 2024-06-26 LAB — HEMOGLOBIN A1C
Est. average glucose Bld gHb Est-mCnc: 114 mg/dL
Hgb A1c MFr Bld: 5.6 % (ref 4.8–5.6)

## 2024-06-26 LAB — VITAMIN B12: Vitamin B-12: 1591 pg/mL — ABNORMAL HIGH (ref 232–1245)

## 2024-06-26 LAB — FOLATE: Folate: 8.5 ng/mL (ref >3.0)

## 2024-06-26 NOTE — Telephone Encounter (Signed)
 Called pt and LVm that her paper info's was sent to her [employer] email.

## 2024-07-09 ENCOUNTER — Ambulatory Visit (INDEPENDENT_AMBULATORY_CARE_PROVIDER_SITE_OTHER): Admitting: Family Medicine

## 2024-07-09 ENCOUNTER — Encounter (INDEPENDENT_AMBULATORY_CARE_PROVIDER_SITE_OTHER): Payer: Self-pay | Admitting: Family Medicine

## 2024-07-09 VITALS — BP 138/83 | HR 74 | Temp 98.1°F | Ht 63.5 in | Wt 257.0 lb

## 2024-07-09 DIAGNOSIS — E88819 Insulin resistance, unspecified: Secondary | ICD-10-CM | POA: Diagnosis not present

## 2024-07-09 DIAGNOSIS — K219 Gastro-esophageal reflux disease without esophagitis: Secondary | ICD-10-CM

## 2024-07-09 DIAGNOSIS — E559 Vitamin D deficiency, unspecified: Secondary | ICD-10-CM

## 2024-07-09 DIAGNOSIS — E669 Obesity, unspecified: Secondary | ICD-10-CM

## 2024-07-09 DIAGNOSIS — I10 Essential (primary) hypertension: Secondary | ICD-10-CM | POA: Diagnosis not present

## 2024-07-09 DIAGNOSIS — E785 Hyperlipidemia, unspecified: Secondary | ICD-10-CM

## 2024-07-09 DIAGNOSIS — Z6841 Body Mass Index (BMI) 40.0 and over, adult: Secondary | ICD-10-CM

## 2024-07-09 DIAGNOSIS — E538 Deficiency of other specified B group vitamins: Secondary | ICD-10-CM

## 2024-07-09 MED ORDER — VITAMIN D3 125 MCG (5000 UT) PO TABS
1.0000 | ORAL_TABLET | Freq: Every day | ORAL | Status: AC
Start: 2024-07-09 — End: ?

## 2024-07-09 NOTE — Progress Notes (Signed)
 Gina Farrell, D.O.  ABFM, ABOM Clinical Bariatric Medicine Physician  Office located at: [address] [place], [place]  [address]   Assessment and Plan:   Medications Discontinued During This Encounter  Medication Reason   Cholecalciferol (VITAMIN D3) 5000 units TABS Reorder   Cyanocobalamin (VITAMIN B 12 PO) Non-compliance     Meds ordered this encounter  Medications   Cholecalciferol (VITAMIN D3) 125 MCG (5000 UT) TABS    Sig: Take 1 tablet (5,000 Units total) by mouth daily.     FOR THE DISEASE OF OBESITY:  BMI 40.0-44.9, adult (HCC) current 44.81 Starting Morbid obesity (HCC) start BMI 44.6 Assessment & Plan: Since last office visit on 06/25/2024 patient's muscle mass has increased by 3.2  lbs. Fat mass has decreased by 1.2  lbs. Total body water has increased by 1.4 lbs.  Counseling done on how various foods will affect these numbers and how to maximize success  Total lbs lost to date: + 2 lbs Total weight loss percentage to date: + 0.78 %   Recommended Dietary Goals Keyshawna is currently in the action stage of change. As such, her goal is to continue weight management plan.  She has agreed to: continue current plan   Behavioral Intervention We discussed the following today: the concept of paying to play, healthy sources of fats, increasing lean protein intake to established goals, decreasing simple carbohydrates , and increasing fiber rich foods  Additional resources provided today: Handout on insulin  resistance education  Evidence-based interventions for health behavior change were utilized today including the discussion of self monitoring techniques, problem-solving barriers and SMART goal setting techniques.  Regarding patient's less desirable eating habits and patterns, we employed the technique of small changes.   Pt will specifically work on: n/a   Recommended Physical Activity Goals Elleana has been advised to work up to 300-450 minutes of  moderate intensity aerobic activity a week and strengthening exercises 2-3 times per week for cardiovascular health, weight loss maintenance and preservation of muscle mass.   She has agreed to : continue to gradually increase the amount and intensity of exercise routine   Pharmacotherapy Continue with current nutritional and behavioral strategies   ASSOCIATED CONDITIONS ADDRESSED TODAY:   Insulin  resistance - new onset Assessment & Plan: Lab Results  Component Value Date   HGBA1C 5.6 06/25/2024   HGBA1C 5.6 03/14/2024   HGBA1C 5.5 08/23/2023   INSULIN  36.4 (H) 06/25/2024   Lab Results  Component Value Date   CREATININE 0.74 06/25/2024   BUN 16 06/25/2024   NA 138 06/25/2024   K 4.1 06/25/2024   CL 99 06/25/2024   CO2 20 06/25/2024      Component Value Date/Time   PROT 7.4 06/25/2024 0930   ALBUMIN 4.3 06/25/2024 0930   AST 16 06/25/2024 0930   ALT 13 06/25/2024 0930   ALKPHOS 82 06/25/2024 0930   BILITOT 0.3 06/25/2024 0930   BILIDIR 0.1 01/29/2014 1053   IBILI 0.3 01/29/2014 1053    Lab Results  Component Value Date   WBC 6.3 06/25/2024   HGB 12.8 06/25/2024   HCT 41.0 06/25/2024   MCV 84 06/25/2024   PLT 241 06/25/2024   Lab Results  Component Value Date   TSH 1.210 06/25/2024   FREET4 1.06 06/25/2024   Her fasting insulin  levels are elevated at 36.4. Optimal level <5. Hemoglobin A1c, thyroid levels, kidney function, electrolytes, and liver enzymes are WNL.  Her blood counts are not indicative of anemia.   -  Patient aware of disease state and risk of progression. - Explained role of simple carbs and insulin  levels on hunger and cravings and blood sugar levels.  - Educational handout on I.R  provided.  - Educated patient that having adequate amounts of protein with each meal is important for increasing muscle mass, stabilizing sugars, controlling hunger and cravings, and improving thermogenesis. - Continue working on nutrition plan to decrease simple  carbohydrates, increase lean proteins and exercise to promote weight loss and improve glycemic control and prevent progression to Pre-DM.  - Encouraged pt to read more about this condition on the American Diabetes Association website.    Benign essential hypertension Assessment & Plan: Last 3 blood pressure readings in our office are as follows: BP Readings from Last 3 Encounters:  07/09/24 138/83  06/25/24 136/83  05/27/24 138/86   The 10-year ASCVD risk score (Arnett DK, et al., 2019) is: 2.2%  Lab Results  Component Value Date   CREATININE 0.74 06/25/2024   On Olmesartan -hydrochlorothiazide  40/25 mg daily with reported good compliance and tolerance. Blood pressure is stable today. Pt asx. No acute concerns.   - Maintain with current regimen. - Goal BP: 120/80 or less on a regular basis.  - Continue to work on nutrition plan to promote weight loss and improve BP control.     Gastroesophageal reflux disease, unspecified whether esophagitis present Assessment & Plan: Lab Results  Component Value Date   CALCIUM 10.4 (H) 06/25/2024   She has a history of GERD. Patient was taking TUMs every night for a prolonged period out of habit however discontinued it after seeing her Calcium was mildly elevated at 10.4. Additionally, she has been eating earlier in the day and is not eating past 7 pm. She feels her GERD symptoms are currently well-controlled.   - Continue to hold off on TUMS. - Continue treatment regimen.  - Will continue to monitor calcium levels.     Hyperlipidemia, unspecified hyperlipidemia type Assessment & Plan: Lab Results  Component Value Date   CHOL 190 06/25/2024   HDL 54 06/25/2024   LDLCALC 118 (H) 06/25/2024   TRIG 97 06/25/2024   CHOLHDL 3.5 06/25/2024    The 89-bzjm ASCVD risk score (Arnett DK, et al., 2019) is: 2.2%   Values used to calculate the score:     Age: 15 years     Clincally relevant sex: Female     Is Non-Hispanic African American:  No     Diabetic: No     Tobacco smoker: No     Systolic Blood Pressure: 138 mmHg     Is BP treated: Yes     HDL Cholesterol: 54 mg/dL     Total Cholesterol: 190 mg/dL  On Fish Oil 100 mg daily with reported good compliance and tolerance. Most recent lipid panel was WNL w/exception of LDL.  Elevated LDL may be secondary to nutrition, genetics and spillover effect from excess adiposity. Optimal level <100.  Denies fhx of early CAD. No concerns with ASCVD risk score. Her CT cardiac scoring dated 06/06/2024 demonstrated a coronary calcium score of 0.771.  - Continue with our prudent nutritional plan that is low in saturated and trans fats, and low in fatty carbs to improve these numbers.  - Advance exercise as able; eventual goal - 300 to 450 minutes of moderate intensity aerobic activity a week and strengthening exercises 2-3 times per week     Vitamin D  deficiency Assessment & Plan: Lab Results  Component Value Date  VD25OH 70.7 06/25/2024   VD25OH 66.6 03/14/2024   VD25OH 85.2 08/23/2023   Currently on OTC Vit D 5,000 units daily with good compliance and tolerance.  Her Vit D levels are essentially at goal of 50 to 70.   - Discussed the importance of vitamin D  to the patient's health and well-being as well as to their ability to lose weight.  - She will continue current supplementation.  - Recheck: 3 mos or so.     (Self-diagnosed) B12 deficiency Assessment & Plan: Lab Results  Component Value Date   VITAMINB12 1,591 (H) 06/25/2024   Lab Results  Component Value Date   FOLATE 8.5 06/25/2024   Folate at goal. B12 level is elevated at 1591; pt discontinued her daily OTC B12 upon seeing that level was elevated.   - This diagnosis was reviewed with the patient and education was provided.  - Continue to hold off on B12 supplementation. - Recheck: 3 months.     FOLLOW UP:   Return 07/24/2024 9:20 AM. She was informed of the importance of frequent follow up visits to  maximize her success with intensive lifestyle modifications for her multiple health conditions.   Subjective:    Chief complaint: Obesity Patience is here to discuss her progress with her obesity treatment plan. She is on the Category 3 eating plan with lunch options and 4 extra ounces of lean protein with the option to journal 1950-2050 cal and 120+grams protein daily and states she is following her eating plan approximately 50 % of the time. She states she is walking 30 minutes weekly on average   Interval History:  AUSTRALIA DROLL is here today for her first follow-up office visit since starting the program with us . Since last OV, pt is up 2 lbs. Pt was not able to implement her prudent nutritional plan fully because she was on a cruise vacation for almost 10 days.  During vacation, she was intentional about eating 3 meals a day but struggled with snacking in between meals. She did walk a lot on the cruise. Prior to going on vacation, she was getting in her leans proteins and following the principles of her meal plan. She has no upcoming travel.   Pharmacotherapy for weight loss: none   Review of Systems:  Pertinent positives were addressed with patient today.   Reviewed by clinician on day of visit: allergies, medications, problem list, medical history, surgical history, family history, social history, and previous encounter notes.  Weight Summary and Biometrics   Weight Lost Since Last Visit: 0  Weight Gained Since Last Visit: 2lb   Vitals Temp: 98.1 F (36.7 C) BP: 138/83 Pulse Rate: 74 SpO2: 97 %   Anthropometric Measurements Height: 5' 3.5 (1.613 m) Weight: 257 lb (116.6 kg) BMI (Calculated): 44.81 Weight at Last Visit: 255lb Weight Lost Since Last Visit: 0 Weight Gained Since Last Visit: 2lb Starting Weight: 255lb Total Weight Loss (lbs): 0 lb (0 kg) Peak Weight: 270lb Waist Measurement : 49 inches   Body Composition  Body Fat %: 45.5 % Fat Mass (lbs): 117.2  lbs Muscle Mass (lbs): 133.4 lbs Total Body Water (lbs): 92.6 lbs Visceral Fat Rating : 15   Other Clinical Data Fasting: no Labs: No Today's Visit #: 2 Starting Date: 06/25/24   Objective:   PHYSICAL EXAM:  Blood pressure 138/83, pulse 74, temperature 98.1 F (36.7 C), height 5' 3.5 (1.613 m), weight 257 lb (116.6 kg), last menstrual period 05/26/2024, SpO2 97%. Body mass index is  44.81 kg/m.  General: she is overweight, cooperative and in no acute distress.   HEENT: EOMI, sclerae are anicteric. Lungs: Normal breathing effort, no conversational dyspnea. M-Sk:  Normal gross ROM * 4 extremities  PSYCH: Has normal mood, affect and thought process. Neurologic: No gross sensory or motor deficits. Well developed, A and O * 3  DIAGNOSTIC DATA REVIEWED:  BMET    Component Value Date/Time   NA 138 06/25/2024 0930   K 4.1 06/25/2024 0930   CL 99 06/25/2024 0930   CO2 20 06/25/2024 0930   GLUCOSE 86 06/25/2024 0930   GLUCOSE 91 05/14/2021 1334   BUN 16 06/25/2024 0930   CREATININE 0.74 06/25/2024 0930   CREATININE 0.62 01/29/2014 1053   CALCIUM 10.4 (H) 06/25/2024 0930   GFRNONAA >60 05/14/2021 1334   GFRNONAA >89 01/29/2014 1053   GFRAA 121 04/16/2020 0940   GFRAA >89 01/29/2014 1053   Lab Results  Component Value Date   HGBA1C 5.6 06/25/2024   HGBA1C 5.2 01/29/2014   Lab Results  Component Value Date   INSULIN  36.4 (H) 06/25/2024   Lab Results  Component Value Date   TSH 1.210 06/25/2024   CBC    Component Value Date/Time   WBC 6.3 06/25/2024 0930   WBC 7.1 05/14/2021 1311   RBC 4.90 06/25/2024 0930   RBC 4.73 05/14/2021 1311   HGB 12.8 06/25/2024 0930   HCT 41.0 06/25/2024 0930   PLT 241 06/25/2024 0930   MCV 84 06/25/2024 0930   MCH 26.1 (L) 06/25/2024 0930   MCH 25.8 (L) 05/14/2021 1311   MCHC 31.2 (L) 06/25/2024 0930   MCHC 32.9 05/14/2021 1311   RDW 14.2 06/25/2024 0930   Iron Studies No results found for: IRON, TIBC, FERRITIN,  IRONPCTSAT Lipid Panel     Component Value Date/Time   CHOL 190 06/25/2024 0930   TRIG 97 06/25/2024 0930   HDL 54 06/25/2024 0930   CHOLHDL 3.5 06/25/2024 0930   CHOLHDL 4 01/19/2016 0848   VLDL 19.2 01/19/2016 0848   LDLCALC 118 (H) 06/25/2024 0930   Hepatic Function Panel     Component Value Date/Time   PROT 7.4 06/25/2024 0930   ALBUMIN 4.3 06/25/2024 0930   AST 16 06/25/2024 0930   ALT 13 06/25/2024 0930   ALKPHOS 82 06/25/2024 0930   BILITOT 0.3 06/25/2024 0930   BILIDIR 0.1 01/29/2014 1053   IBILI 0.3 01/29/2014 1053      Component Value Date/Time   TSH 1.210 06/25/2024 0930   Nutritional Lab Results  Component Value Date   VD25OH 70.7 06/25/2024   VD25OH 66.6 03/14/2024   VD25OH 85.2 08/23/2023    Attestations:   I, Special Puri, acting as a Stage manager for [employer], DO., have compiled all relevant documentation for today's office visit on behalf of Gina Jenkins, DO, while in the presence of [employer], DO.  I have spent 61 minutes in the care of the patient today including 51 minutes on face-to-face counseling and reviewing listed medical problems above as outlined in office visit note, providing nutritional and behavioral counseling as outlined in obesity care plan, independently interpreting results and goals of care, see listed medical problems, and discussing biometric information and progress. We reviewed her meal plan and discussed how the foods she's eating is affecting each one of her labs. Pt educated on why we want her to eat various foods in various amounts and has a better understanding of the nutritional plan because of this. All her  questions were answered today.   I have reviewed the above documentation for accuracy and completeness, and I agree with the above. Gina JINNY Farrell, D.O.  The 21st Century Cures Act was signed into law in 2016 which includes the topic of electronic health records.  This provides immediate access to  information in MyChart.  This includes consultation notes, operative notes, office notes, lab results and pathology reports.  If you have any questions about what you read please let us  know at your next visit so we can discuss your concerns and take corrective action if need be.  We are right here with you.

## 2024-07-09 NOTE — Patient Instructions (Signed)
 The 10-year ASCVD risk score (Arnett DK, et al., 2019) is: 2.2%   Values used to calculate the score:     Age: 54 years     Clincally relevant sex: Female     Is Non-Hispanic African American: No     Diabetic: No     Tobacco smoker: No     Systolic Blood Pressure: 138 mmHg     Is BP treated: Yes     HDL Cholesterol: 54 mg/dL     Total Cholesterol: 190 mg/dL

## 2024-07-23 ENCOUNTER — Telehealth: Payer: Self-pay

## 2024-07-23 ENCOUNTER — Ambulatory Visit

## 2024-07-23 VITALS — BP 138/82 | HR 85 | Temp 97.5°F | Ht 63.5 in | Wt 260.1 lb

## 2024-07-23 DIAGNOSIS — I10 Essential (primary) hypertension: Secondary | ICD-10-CM | POA: Diagnosis not present

## 2024-07-23 DIAGNOSIS — J340 Abscess, furuncle and carbuncle of nose: Secondary | ICD-10-CM | POA: Insufficient documentation

## 2024-07-23 MED ORDER — DOXYCYCLINE HYCLATE 100 MG PO TABS: 100.0000 mg | ORAL_TABLET | Freq: Two times a day (BID) | ORAL | 0 refills | Status: AC

## 2024-07-23 MED ORDER — MUPIROCIN 2 % EX OINT: 1.0000 | TOPICAL_OINTMENT | Freq: Two times a day (BID) | CUTANEOUS | 0 refills | Status: AC

## 2024-07-23 NOTE — Telephone Encounter (Signed)
 Called patient/ scheduled her for today @10 :10.

## 2024-07-23 NOTE — Patient Instructions (Signed)
 VISIT SUMMARY: During your visit, we discussed the sore and warm sensation in your right nostril, which has been present for 3-4 weeks. W  YOUR PLAN: -CELLULITIS OF RIGHT NASAL VESTIBULE: Cellulitis is a bacterial skin infection that causes redness, swelling, and tenderness. It is likely due to an ingrown hair or irritation in your case. You will need to apply mupirocin  topical antibiotic inside your nose twice daily for two weeks and take doxycycline  oral antibiotic twice daily with food. Clean your nose hair trimmer with alcohol before using it again and avoid using it until the infection resolves. Also, clean the saline spray tip with alcohol after each use. Monitor for worsening symptoms and return if there is no improvement.   INSTRUCTIONS: Monitor your symptoms and return if there is no improvement or if symptoms are worsening.  If you have any problems before your next visit feel free to message me via MyChart (minor issues or questions) or call the office, otherwise you may reach out to schedule an office visit.  Thank you! Saddie Sacks, PA-C

## 2024-07-23 NOTE — Assessment & Plan Note (Signed)
 BP Goal < 140/90. Above goal today in office, improved on recheck. Continue olmesartan -hydrochlorothiazide  40-25 mg daily, Will cont to monitor.

## 2024-07-23 NOTE — Progress Notes (Signed)
 Acute Office Visit  Subjective:     Patient ID: Gina Farrell, female    DOB: January 11, 1971, 53 y.o.   MRN: [id number]  Chief Complaint  Patient presents with   Nose Sore    Onset:     HPI  History of Present Illness   Gina Farrell is a 53 year old female who presents with a sore and warm sensation in the right nostril.  Right nostril lesion and localized symptoms - Sore and warm sensation in the right nostril for approximately 3-4 weeks - Initial appearance of a small bump resembling a pimple or ingrown hair inside the right nostril - Progression to soreness and a sensation of something being stuck in the nostril - Development of a 'weird smell' suggestive of infection - No blood or discharge, but presence of 'goopy' material associated with the odor - Nose became hot to the touch and tender approximately 3-4 days ago  Systemic symptoms - No fever, body aches, or chills  Contributing factors and self-care - Frequent use of nose hair trimmers - Particular about keeping nasal passages clear - Uncertain if lesion was irritated by self-care measures       ROS Per HPI     Objective:    BP (!) 146/84   Pulse 85   Temp (!) 97.5 F (36.4 C) (Oral)   Ht 5' 3.5 (1.613 m)   Wt 260 lb 1.3 oz (118 kg)   LMP 05/26/2024   SpO2 98%   BMI 45.35 kg/m    Physical Exam Constitutional:      General: She is not in acute distress.    Appearance: Normal appearance.  HENT:     Nose:     Comments: Red, small, non-obstructing pustule noted to the right nostril. It is without discharge.  Tip of nose mildly red and warm to touch. No fluctuance. Cardiovascular:     Rate and Rhythm: Normal rate and regular rhythm.     Heart sounds: Normal heart sounds. No murmur heard.    No friction rub. No gallop.  Pulmonary:     Effort: Pulmonary effort is normal. No respiratory distress.     Breath sounds: Normal breath sounds.  Musculoskeletal:        General: No swelling.  Skin:     General: Skin is warm and dry.  Neurological:     General: No focal deficit present.     Mental Status: She is alert.  Psychiatric:        Mood and Affect: Mood normal.        Behavior: Behavior normal.        Thought Content: Thought content normal.      No results found for any visits on 07/23/24.     Assessment & Plan:   Cellulitis of nostril Assessment & Plan: Cellulitis likely due to ingrown hair or irritation. Increased tenderness and warmth suggest infection. Green discharge and odor confirm infection. No systemic symptoms. - Prescribed mupirocin  topical antibiotic, apply with Q-tip inside nose twice daily for two weeks. - Prescribed doxycycline  100 mg oral antibiotic, take twice daily with food x 7 days.  - Advised cleaning nose hair trimmer with alcohol before next use and avoid use until infection resolves. - Advised cleaning saline spray tip with alcohol after each use. - Instructed to monitor for worsening symptoms and return if no improvement.   Benign essential hypertension Assessment & Plan: BP Goal < 140/90. Above goal today in office, improved on  recheck. Continue olmesartan -hydrochlorothiazide  40-25 mg daily, Will cont to monitor.    Other orders -     Doxycycline  Hyclate; Take 1 tablet (100 mg total) by mouth 2 (two) times daily for 7 days.  Dispense: 14 tablet; Refill: 0 -     Mupirocin ; Apply 1 Application topically 2 (two) times daily.  Dispense: 22 g; Refill: 0    Return if symptoms worsen or fail to improve.  Saddie JULIANNA Sacks, PA-C

## 2024-07-23 NOTE — Assessment & Plan Note (Signed)
 Cellulitis likely due to ingrown hair or irritation. Increased tenderness and warmth suggest infection. Green discharge and odor confirm infection. No systemic symptoms. - Prescribed mupirocin  topical antibiotic, apply with Q-tip inside nose twice daily for two weeks. - Prescribed doxycycline  100 mg oral antibiotic, take twice daily with food x 7 days.  - Advised cleaning nose hair trimmer with alcohol before next use and avoid use until infection resolves. - Advised cleaning saline spray tip with alcohol after each use. - Instructed to monitor for worsening symptoms and return if no improvement.

## 2024-07-24 ENCOUNTER — Ambulatory Visit (INDEPENDENT_AMBULATORY_CARE_PROVIDER_SITE_OTHER): Admitting: Family Medicine

## 2024-07-24 ENCOUNTER — Encounter (INDEPENDENT_AMBULATORY_CARE_PROVIDER_SITE_OTHER): Payer: Self-pay | Admitting: Family Medicine

## 2024-07-24 DIAGNOSIS — J3089 Other allergic rhinitis: Secondary | ICD-10-CM | POA: Diagnosis not present

## 2024-07-24 DIAGNOSIS — E88819 Insulin resistance, unspecified: Secondary | ICD-10-CM

## 2024-07-24 DIAGNOSIS — I10 Essential (primary) hypertension: Secondary | ICD-10-CM

## 2024-07-24 DIAGNOSIS — E559 Vitamin D deficiency, unspecified: Secondary | ICD-10-CM

## 2024-07-24 DIAGNOSIS — Z9109 Other allergy status, other than to drugs and biological substances: Secondary | ICD-10-CM

## 2024-07-24 DIAGNOSIS — Z6841 Body Mass Index (BMI) 40.0 and over, adult: Secondary | ICD-10-CM

## 2024-07-24 NOTE — Progress Notes (Signed)
 Gina Farrell, D.O.  ABFM, ABOM Specializing in Clinical Bariatric Medicine  Office located at: [address] [place], [place]  [address]     Assessment and Plan:    FOR THE DISEASE OF OBESITY:  Starting Morbid obesity (HCC) start BMI 44.6 BMI 40.0-44.9, adult (HCC) current 44.98 Assessment & Plan: Since last office visit on 07/09/24 patient's muscle mass has decreased by 15.8 lbs. Fat mass has increased by 17.4 lbs. Total body water has decreased by 1 lb.  Body fat % has increased by 6.6 %. Counseling done on how various foods will affect these numbers and how to maximize success  Total lbs lost to date: +3 lbs Total weight loss percentage to date: +1.18 %   Recommended Dietary Goals Gina Farrell is currently in the action stage of change. As such, her goal is to continue weight management plan.  She has agreed to: continue current plan   Behavioral Intervention We extensively discussed the following today: increasing lean protein intake to established goals, decreasing simple carbohydrates , avoiding skipping meals, increasing water intake , work on meal planning and preparation, keeping healthy foods at home, decreasing eating out or consumption of processed foods, and making healthy choices when eating convenient foods, avoiding temptations and identifying enticing environmental cues, better snacking choices, and focusing on food with a 10:1 ratio of calories: grams of protein. Eating different sources of protein for breakfast options, journaling when eating off plan to increase her mindfulness, working on finding healthier alternatives that prioritize lean protein, fruits, and vegetables when eating out and reviewed Eating Out Guide handout, Properly hydrate by drinking half her body wt in ounces of water per day.    Additional resources provided today: Handout on CAT 3-4 breakfast options, Handout on benefits of metformin for weight loss, and Handout on Common  Characteristics of Successful Weight Losers and Maintainers , and Eating Out Guide.  Evidence-based interventions for health behavior change were utilized today including the discussion of self monitoring techniques, problem-solving barriers and SMART goal setting techniques.   Regarding patient's less desirable eating habits and patterns, we employed the technique of small changes.   Pt will specifically work on: Eat breakfast every day and increase water intake (aim for 128 ounces/day)   Recommended Physical Activity Goals Gina Farrell has been advised to work up to 300-450 minutes of moderate intensity aerobic activity a week and strengthening exercises 2-3 times per week for cardiovascular health, weight loss maintenance and preservation of muscle mass.   She has agreed to: Continue current level of physical activity  and Increase physical activity in their day and reduce sedentary time (increase NEAT).   Pharmacotherapy We both agreed to: Continue with current nutritional and behavioral strategies   ASSOCIATED CONDITIONS ADDRESSED TODAY:  Insulin  resistance - new onset Assessment & Plan: Lab Results  Component Value Date   HGBA1C 5.6 06/25/2024   HGBA1C 5.6 03/14/2024   HGBA1C 5.5 08/23/2023   INSULIN  36.4 (H) 06/25/2024    BP at goal. Not currently on pharmacologic treatment. Diet/lifestyle intervention approach.  Admits to only drinking 1 bottle of water in the past 2-3 days.   Reviewed the physiological benefits of Metformin for weight loss; pt will consider taking in the future. Work on following her nutritional meal plan; decrease simple carbs and added sugars and increase protein, fiber, and whole foods. Reviewed the role of protein with improving insulin  sensitivity and hunger/cravings. Properly hydrate daily; aim for at least 128 ounces (1 gal) of water  per day. Will continue monitoring.     Environmental allergies Allergic rhinitis, unspecified seasonality, unspecified  trigger Assessment & Plan: Currently on Singulair  10 mg daily and Flonase  prn for flare ups. Good compliance and tolerance reported. Takes her medications whether it is allergy season or not. Continue with current regimen as prescribed. Will continue monitoring condition as it relates to her weight management.     Vitamin D  deficiency Assessment & Plan: Lab Results  Component Value Date   VD25OH 70.7 06/25/2024   VD25OH 66.6 03/14/2024   VD25OH 85.2 08/23/2023   Currently on OTC Vit D 5K units daily. Good compliance/tolerance. No SE or acute concerns. Continue current vit D supplementation. Recheck periodically, 3-4 months from last obtained.     Follow up:   Return in about 20 days (around 08/13/2024) for f/u on 08/13/2024 at 3:20 PM. Make an additional 3 wk f/u . She was informed of the importance of frequent follow up visits to maximize her success with intensive lifestyle modifications for her multiple health conditions.  Subjective:   Chief complaint: Obesity Gina Farrell is here to discuss her progress with her obesity treatment plan. She is on the Category 3 w L options, 4 extra ounces of lean protein. She was also given the option to journal 1950-2050 cal and 120+grams protein daily and states she is following her eating plan approximately 50% of the time. She states she is walking 20-30 minutes 3 days per week.   Interval History:  Gina Farrell is here for a follow up office visit. Since last OV on 07/09/24, she is up 1 lb. She reports not meeting her recommended amount of protein daily, skipping meals and not adequately hydrating on a daily basis. She reports drinking 1 bottle in the last -3 days collectively. When she was previously meeting her protein intake goal, her hunger and cravings were well controlled. Has been tracking macros on My Net Diary; staying within her calorie intake goal and eating 125-140 g of protein daily. Struggles with healthy food options when she has not meal  prepped. Pt has perimenopause and recently started her menses 3-4 days ago after not having a menses for about 5 months. She reports experiencing bloating, irritability, and increased hunger/cravings.    Pharmacotherapy that aid with weight loss: She is currently taking no anti-obesity medication.    Review of Systems:  Pertinent positives were addressed with patient today.  Reviewed by clinician on day of visit: allergies, medications, problem list, medical history, surgical history, family history, social history, and previous encounter notes.  Weight Summary and Biometrics   Weight Lost Since Last Visit: 0lb  Weight Gained Since Last Visit: 1lb   Vitals Temp: 98.2 F (36.8 C) BP: 126/85 Pulse Rate: 78 SpO2: 95 %   Anthropometric Measurements Height: 5' 3.5 (1.613 m) Weight: 258 lb (117 kg) BMI (Calculated): 44.98 Weight at Last Visit: 257lb Weight Lost Since Last Visit: 0lb Weight Gained Since Last Visit: 1lb Starting Weight: 255lb Total Weight Loss (lbs): 0 lb (0 kg) Peak Weight: 270lb   Body Composition  Body Fat %: 52.1 % Fat Mass (lbs): 134.6 lbs Muscle Mass (lbs): 117.6 lbs Total Body Water (lbs): 91.6 lbs Visceral Fat Rating : 17   Other Clinical Data Fasting: yes Labs: no Today's Visit #: 3 Starting Date: 06/25/24    Objective:   PHYSICAL EXAM: Blood pressure 126/85, pulse 78, temperature 98.2 F (36.8 C), height 5' 3.5 (1.613 m), weight 258 lb (117 kg), last menstrual period  07/21/2024, SpO2 95%. Body mass index is 44.99 kg/m.  General: she is overweight, cooperative and in no acute distress. PSYCH: Has normal mood, affect and thought process.   HEENT: EOMI, sclerae are anicteric. Lungs: Normal breathing effort, no conversational dyspnea. Extremities: Moves * 4 Neurologic: A and O * 3, good insight  DIAGNOSTIC DATA REVIEWED: BMET    Component Value Date/Time   NA 138 06/25/2024 0930   K 4.1 06/25/2024 0930   CL 99 06/25/2024 0930    CO2 20 06/25/2024 0930   GLUCOSE 86 06/25/2024 0930   GLUCOSE 91 05/14/2021 1334   BUN 16 06/25/2024 0930   CREATININE 0.74 06/25/2024 0930   CREATININE 0.62 01/29/2014 1053   CALCIUM 10.4 (H) 06/25/2024 0930   GFRNONAA >60 05/14/2021 1334   GFRNONAA >89 01/29/2014 1053   GFRAA 121 04/16/2020 0940   GFRAA >89 01/29/2014 1053   Lab Results  Component Value Date   HGBA1C 5.6 06/25/2024   HGBA1C 5.2 01/29/2014   Lab Results  Component Value Date   INSULIN  36.4 (H) 06/25/2024   Lab Results  Component Value Date   TSH 1.210 06/25/2024   CBC    Component Value Date/Time   WBC 6.3 06/25/2024 0930   WBC 7.1 05/14/2021 1311   RBC 4.90 06/25/2024 0930   RBC 4.73 05/14/2021 1311   HGB 12.8 06/25/2024 0930   HCT 41.0 06/25/2024 0930   PLT 241 06/25/2024 0930   MCV 84 06/25/2024 0930   MCH 26.1 (L) 06/25/2024 0930   MCH 25.8 (L) 05/14/2021 1311   MCHC 31.2 (L) 06/25/2024 0930   MCHC 32.9 05/14/2021 1311   RDW 14.2 06/25/2024 0930   Iron Studies No results found for: IRON, TIBC, FERRITIN, IRONPCTSAT Lipid Panel     Component Value Date/Time   CHOL 190 06/25/2024 0930   TRIG 97 06/25/2024 0930   HDL 54 06/25/2024 0930   CHOLHDL 3.5 06/25/2024 0930   CHOLHDL 4 01/19/2016 0848   VLDL 19.2 01/19/2016 0848   LDLCALC 118 (H) 06/25/2024 0930   Hepatic Function Panel     Component Value Date/Time   PROT 7.4 06/25/2024 0930   ALBUMIN 4.3 06/25/2024 0930   AST 16 06/25/2024 0930   ALT 13 06/25/2024 0930   ALKPHOS 82 06/25/2024 0930   BILITOT 0.3 06/25/2024 0930   BILIDIR 0.1 01/29/2014 1053   IBILI 0.3 01/29/2014 1053      Component Value Date/Time   TSH 1.210 06/25/2024 0930   Nutritional Lab Results  Component Value Date   VD25OH 70.7 06/25/2024   VD25OH 66.6 03/14/2024   VD25OH 85.2 08/23/2023    Attestations:   I, Gina Farrell, acting as a Stage manager for Gina Jenkins, DO., have compiled all relevant documentation for today's office visit  on behalf of Gina Jenkins, DO, while in the presence of [employer], DO.  Reviewed by clinician on day of visit: allergies, medications, problem list, medical history, surgical history, family history, social history, and previous encounter notes pertinent to patient's obesity diagnosis.  I have spent 45 minutes in the care of the patient today including 35 minutes face-to-face assessing and reviewing listed medical problems above as outlined in office visit note and providing nutritional and behavioral counseling as outlined in obesity care plan.   I have reviewed the above documentation for accuracy and completeness, and I agree with the above. Gina Gina Farrell Farrell, D.O.  The 21st Century Cures Act was signed into law in 2016 which includes the topic of  electronic health records.  This provides immediate access to information in MyChart.  This includes consultation notes, operative notes, office notes, lab results and pathology reports.  If you have any questions about what you read please let us  know at your next visit so we can discuss your concerns and take corrective action if need be.  We are right here with you.

## 2024-08-13 ENCOUNTER — Ambulatory Visit (INDEPENDENT_AMBULATORY_CARE_PROVIDER_SITE_OTHER): Admitting: Family Medicine

## 2024-08-26 ENCOUNTER — Other Ambulatory Visit: Payer: Self-pay

## 2024-09-07 ENCOUNTER — Other Ambulatory Visit: Payer: Self-pay

## 2024-09-07 DIAGNOSIS — Z6841 Body Mass Index (BMI) 40.0 and over, adult: Secondary | ICD-10-CM

## 2024-09-10 ENCOUNTER — Ambulatory Visit (INDEPENDENT_AMBULATORY_CARE_PROVIDER_SITE_OTHER): Admitting: Family Medicine

## 2024-09-11 ENCOUNTER — Other Ambulatory Visit

## 2024-09-11 DIAGNOSIS — Z6841 Body Mass Index (BMI) 40.0 and over, adult: Secondary | ICD-10-CM

## 2024-09-18 ENCOUNTER — Ambulatory Visit (INDEPENDENT_AMBULATORY_CARE_PROVIDER_SITE_OTHER)

## 2024-09-18 ENCOUNTER — Encounter: Admitting: Family Medicine

## 2024-09-18 VITALS — BP 126/77 | HR 86 | Ht 63.5 in | Wt 259.1 lb

## 2024-09-18 DIAGNOSIS — E7800 Pure hypercholesterolemia, unspecified: Secondary | ICD-10-CM | POA: Diagnosis not present

## 2024-09-18 DIAGNOSIS — Z6841 Body Mass Index (BMI) 40.0 and over, adult: Secondary | ICD-10-CM

## 2024-09-18 DIAGNOSIS — I10 Essential (primary) hypertension: Secondary | ICD-10-CM

## 2024-09-18 DIAGNOSIS — Z23 Encounter for immunization: Secondary | ICD-10-CM

## 2024-09-18 DIAGNOSIS — Z9109 Other allergy status, other than to drugs and biological substances: Secondary | ICD-10-CM | POA: Diagnosis not present

## 2024-09-18 DIAGNOSIS — J309 Allergic rhinitis, unspecified: Secondary | ICD-10-CM | POA: Diagnosis not present

## 2024-09-18 DIAGNOSIS — Z0000 Encounter for general adult medical examination without abnormal findings: Secondary | ICD-10-CM | POA: Insufficient documentation

## 2024-09-18 MED ORDER — OLMESARTAN MEDOXOMIL-HCTZ 40-25 MG PO TABS: 1.0000 | ORAL_TABLET | Freq: Every day | ORAL | 1 refills | Status: AC

## 2024-09-18 MED ORDER — MONTELUKAST SODIUM 10 MG PO TABS: 10.0000 mg | ORAL_TABLET | Freq: Every day | ORAL | 1 refills | Status: DC

## 2024-09-18 NOTE — Assessment & Plan Note (Signed)
Stable.  Continue montelukast 10 mg daily, fluticasone nasal spray as needed.  Will continue to monitor.

## 2024-09-18 NOTE — Progress Notes (Signed)
 Complete physical exam  Patient: Gina Farrell   DOB: 1971/10/06   53 y.o. Female  MRN: [id number]  Subjective:    No chief complaint on file.  History of Present Illness   Gina Farrell is a 53 year old female who presents for an annual physical exam and follow-up on weight management.  Weight management and nutritional status - Engaged in ongoing weight management efforts with [facility] - Sees Dr. Midge - Comprehensive blood workup in July or August showed stable kidney and liver function, good A1c, and stable cholesterol panel - Adheres to a diet of 2000 calories per day with 120-130 grams of protein - Struggles with meal timing and protein intake, frequently skips breakfast, and typically has a salad for lunch  Hypertension - Diagnosed hypertension managed with Benicar  HCT - No symptoms of chest pain, shortness of breath, or palpitations  Medication use and supplementation - Takes Singulair  (montelukast ) for unspecified reasons - Discontinued B12 supplementation after elevated B12 levels on previous laboratory testing      Overall patient is feeling well and sleeping well. No other concerns to discuss today.     Most recent fall risk assessment:    09/18/2024    9:48 AM  Fall Risk   Falls in the past year? 0  Follow up Falls evaluation completed     Most recent depression screenings:    09/18/2024    9:48 AM 06/25/2024    7:46 AM  PHQ 2/9 Scores  PHQ - 2 Score 0 0  PHQ- 9 Score 0 0    Vision:Within last year and Dental: No current dental problems and Receives regular dental care    Patient Care Team: Gayle Saddie JULIANNA DEVONNA as PCP - General (Physician Assistant) Leva Rush, MD as Consulting Physician (Obstetrics and Gynecology) Ivin Kocher, MD as Consulting Physician (Dermatology)   Outpatient Medications Prior to Visit  Medication Sig   Cholecalciferol (VITAMIN D3) 125 MCG (5000 UT) TABS Take 1 tablet (5,000 Units total) by  mouth daily.   fluticasone  (FLONASE ) 50 MCG/ACT nasal spray Place 2 sprays into both nostrils daily.   meloxicam (MOBIC) 15 MG tablet Take 15 mg by mouth daily.   Multiple Vitamins-Minerals (MULTIVITAMIN PO) Take by mouth daily.   mupirocin  ointment (BACTROBAN ) 2 % Apply 1 Application topically 2 (two) times daily.   Omega-3 Fatty Acids (FISH OIL) 1000 MG CAPS Take 100 mg by mouth daily.   [DISCONTINUED] montelukast  (SINGULAIR ) 10 MG tablet TAKE 1 TABLET (10 MG TOTAL) BY MOUTH AT BEDTIME.   [DISCONTINUED] olmesartan -hydrochlorothiazide  (BENICAR  HCT) 40-25 MG tablet Take 1 tablet by mouth daily.   No facility-administered medications prior to visit.    ROS  Per HPI      Objective:     BP 126/77   Pulse 86   Ht 5' 3.5 (1.613 m)   Wt 259 lb 1.9 oz (117.5 kg)   SpO2 97%   BMI 45.18 kg/m    Physical Exam Constitutional:      General: She is not in acute distress.    Appearance: Normal appearance.  Cardiovascular:     Rate and Rhythm: Normal rate and regular rhythm.     Heart sounds: Normal heart sounds. No murmur heard.    No friction rub. No gallop.  Pulmonary:     Effort: Pulmonary effort is normal. No respiratory distress.     Breath sounds: Normal breath sounds.  Abdominal:     General: Bowel sounds are  normal.  Musculoskeletal:        General: No swelling.     Cervical back: Neck supple.  Lymphadenopathy:     Cervical: No cervical adenopathy.  Skin:    General: Skin is warm and dry.  Neurological:     General: No focal deficit present.     Mental Status: She is alert.  Psychiatric:        Mood and Affect: Mood normal.        Behavior: Behavior normal.        Thought Content: Thought content normal.       No results found for any visits on 09/18/24. Last CBC Lab Results  Component Value Date   WBC 6.3 06/25/2024   HGB 12.8 06/25/2024   HCT 41.0 06/25/2024   MCV 84 06/25/2024   MCH 26.1 (L) 06/25/2024   RDW 14.2 06/25/2024   PLT 241 06/25/2024    Last metabolic panel Lab Results  Component Value Date   GLUCOSE 86 06/25/2024   NA 138 06/25/2024   K 4.1 06/25/2024   CL 99 06/25/2024   CO2 20 06/25/2024   BUN 16 06/25/2024   CREATININE 0.74 06/25/2024   EGFR 97 06/25/2024   CALCIUM 10.4 (H) 06/25/2024   PHOS 3.2 12/20/2017   PROT 7.4 06/25/2024   ALBUMIN 4.3 06/25/2024   LABGLOB 3.1 06/25/2024   AGRATIO 1.4 04/17/2023   BILITOT 0.3 06/25/2024   ALKPHOS 82 06/25/2024   AST 16 06/25/2024   ALT 13 06/25/2024   ANIONGAP 4 (L) 05/14/2021   Last lipids Lab Results  Component Value Date   CHOL 190 06/25/2024   HDL 54 06/25/2024   LDLCALC 118 (H) 06/25/2024   TRIG 97 06/25/2024   CHOLHDL 3.5 06/25/2024   Last hemoglobin A1c Lab Results  Component Value Date   HGBA1C 5.6 06/25/2024   Last thyroid functions Lab Results  Component Value Date   TSH 1.210 06/25/2024   FREET4 1.06 06/25/2024   Last vitamin D  Lab Results  Component Value Date   VD25OH 70.7 06/25/2024        Assessment & Plan:    Routine Health Maintenance and Physical Exam  Health Maintenance  Topic Date Due   Hepatitis B Vaccine (1 of 3 - 19+ 3-dose series) Never done   Pneumococcal Vaccine for age over 88 (1 of 1 - PCV) Never done   Zoster (Shingles) Vaccine (1 of 2) Never done   COVID-19 Vaccine (4 - 2025-26 season) 07/29/2024   Flu Shot  02/25/2025*   Breast Cancer Screening  02/12/2026   Colon Cancer Screening  12/28/2027   Pap with HPV screening  02/18/2029   DTaP/Tdap/Td vaccine (4 - Td or Tdap) 08/27/2033   Hepatitis C Screening  Completed   HIV Screening  Completed   HPV Vaccine  Aged Out   Meningitis B Vaccine  Aged Out  *Topic was postponed. The date shown is not the original due date.    Discussed health benefits of physical activity, and encouraged her to engage in regular exercise appropriate for her age and condition.  Pure hypercholesterolemia Assessment & Plan: Last lipid panel: LDL 118, HDL 54, Trig 97. The  10-year ASCVD risk score (Arnett DK, et al., 2019) is: 1.9% Patient also had coronary calcium score, which was essentially unremarkable. Continue with low fat diet and exercise. Will cont to monitor.   Environmental allergies -     Montelukast  Sodium; Take 1 tablet (10 mg total) by mouth at bedtime.  Dispense: 90 tablet; Refill: 1  Allergic rhinitis, unspecified seasonality, unspecified trigger Assessment & Plan: Stable.  Continue montelukast  10 mg daily, fluticasone  nasal spray as needed.  Will continue to monitor.  Orders: -     Montelukast  Sodium; Take 1 tablet (10 mg total) by mouth at bedtime.  Dispense: 90 tablet; Refill: 1  Need for shingles vaccine  BMI 40.0-44.9, adult [facility]) Assessment & Plan: Focused on dietary habits and protein intake. She prefers lifestyle modifications over metformin. Discussed metformin for potential insulin  resistance if needed. Following with Dr. Midge with [facility].  - Continue dietary modifications with focus on increasing protein intake. - Consider metformin in the future if lifestyle changes are insufficient. - Explore high-protein recipes and meal prep strategies.   Benign essential hypertension Assessment & Plan: BP Goal < 140/90. Stable and at goal.  Continue olmesartan -hydrochlorothiazide  40-25 mg daily, Will cont to monitor.    Healthcare maintenance Assessment & Plan: Stable kidney and liver function, A1c, and cholesterol. Low cardiovascular risk. Discussed weight management, dietary habits, and intermittent fasting. - Continue weight management with Dr. MALVA. - Discuss intermittent fasting with Dr. MALVA in November. - Administer flu shot in early November. - Consider pneumonia vaccination (Prevnar 20). - Consider shingles vaccination after [place] trip.    Other orders -     Olmesartan  Medoxomil-HCTZ; Take 1 tablet by mouth daily.  Dispense: 90 tablet; Refill: 1      Return in about 6 months (around 03/19/2025) for  HTN.     Saddie JULIANNA Sacks, PA-C

## 2024-09-18 NOTE — Assessment & Plan Note (Signed)
 Focused on dietary habits and protein intake. She prefers lifestyle modifications over metformin. Discussed metformin for potential insulin  resistance if needed. Following with Dr. Midge with [facility].  - Continue dietary modifications with focus on increasing protein intake. - Consider metformin in the future if lifestyle changes are insufficient. - Explore high-protein recipes and meal prep strategies.

## 2024-09-18 NOTE — Assessment & Plan Note (Signed)
 Last lipid panel: LDL 118, HDL 54, Trig 97. The 10-year ASCVD risk score (Arnett DK, et al., 2019) is: 1.9% Patient also had coronary calcium score, which was essentially unremarkable. Continue with low fat diet and exercise. Will cont to monitor.

## 2024-09-18 NOTE — Assessment & Plan Note (Signed)
 BP Goal < 140/90. Stable and at goal.  Continue olmesartan -hydrochlorothiazide  40-25 mg daily, Will cont to monitor.

## 2024-09-18 NOTE — Assessment & Plan Note (Signed)
 Stable kidney and liver function, A1c, and cholesterol. Low cardiovascular risk. Discussed weight management, dietary habits, and intermittent fasting. - Continue weight management with Dr. MALVA. - Discuss intermittent fasting with Dr. MALVA in November. - Administer flu shot in early November. - Consider pneumonia vaccination (Prevnar 20). - Consider shingles vaccination after [place] trip.

## 2024-09-18 NOTE — Patient Instructions (Signed)
 VISIT SUMMARY: Today, you had your annual physical exam and follow-up on weight management. We discussed your dietary habits, hypertension, and general health maintenance, including vaccinations.  YOUR PLAN: WEIGHT MANAGEMENT AND NUTRITIONAL STATUS: You are actively managing your weight with a 2000 calorie diet and 120-130 grams of protein daily. You struggle with meal timing and often skip breakfast. -Continue your weight management efforts with Dr. MALVA. -Discuss intermittent fasting with Dr. MALVA in November. -Focus on increasing your protein intake. -Explore high-protein recipes and meal prep strategies.  HYPERTENSION: Your blood pressure is well-controlled with Benicar  -Continue taking Benicar . Your prescription has been refilled.  ALLERGIC RHINITIS: You are managing your allergic rhinitis with montelukast . -Continue taking montelukast . Your prescription has been refilled.  GENERAL HEALTH MAINTENANCE: Your vaccinations and screenings are up to date. We discussed flu, pneumonia, and shingles vaccinations. -Get your flu shot in early November. -Consider getting the pneumonia vaccination (Prevnar 20). -Consider getting the shingles vaccination after your [place] trip.  If you have any problems before your next visit feel free to message me via MyChart (minor issues or questions) or call the office, otherwise you may reach out to schedule an office visit.  Thank you! Saddie Sacks, PA-C

## 2024-10-26 ENCOUNTER — Other Ambulatory Visit: Payer: Self-pay

## 2024-10-26 DIAGNOSIS — Z9109 Other allergy status, other than to drugs and biological substances: Secondary | ICD-10-CM

## 2024-10-26 DIAGNOSIS — J309 Allergic rhinitis, unspecified: Secondary | ICD-10-CM

## 2025-03-19 ENCOUNTER — Ambulatory Visit
